# Patient Record
Sex: Male | Born: 1940
Health system: Southern US, Community
[De-identification: ages and names within clinical notes are randomized; demographics above are authoritative.]

## PROBLEM LIST (undated history)

## (undated) DIAGNOSIS — R071 Chest pain on breathing: Secondary | ICD-10-CM

## (undated) DIAGNOSIS — I10 Essential (primary) hypertension: Secondary | ICD-10-CM

## (undated) DIAGNOSIS — G459 Transient cerebral ischemic attack, unspecified: Secondary | ICD-10-CM

## (undated) DIAGNOSIS — R5381 Other malaise: Secondary | ICD-10-CM

## (undated) DIAGNOSIS — R079 Chest pain, unspecified: Secondary | ICD-10-CM

## (undated) DIAGNOSIS — R209 Unspecified disturbances of skin sensation: Secondary | ICD-10-CM

## (undated) DIAGNOSIS — N400 Enlarged prostate without lower urinary tract symptoms: Secondary | ICD-10-CM

## (undated) DIAGNOSIS — F419 Anxiety disorder, unspecified: Secondary | ICD-10-CM

## (undated) DIAGNOSIS — T4145XA Adverse effect of unspecified anesthetic, initial encounter: Secondary | ICD-10-CM

## (undated) DIAGNOSIS — G4733 Obstructive sleep apnea (adult) (pediatric): Secondary | ICD-10-CM

## (undated) DIAGNOSIS — D751 Secondary polycythemia: Secondary | ICD-10-CM

## (undated) DIAGNOSIS — F329 Major depressive disorder, single episode, unspecified: Secondary | ICD-10-CM

## (undated) DIAGNOSIS — C439 Malignant melanoma of skin, unspecified: Secondary | ICD-10-CM

## (undated) DIAGNOSIS — Z9889 Other specified postprocedural states: Secondary | ICD-10-CM

## (undated) DIAGNOSIS — K219 Gastro-esophageal reflux disease without esophagitis: Secondary | ICD-10-CM

## (undated) DIAGNOSIS — F32A Depression, unspecified: Secondary | ICD-10-CM

## (undated) DIAGNOSIS — H547 Unspecified visual loss: Secondary | ICD-10-CM

## (undated) DIAGNOSIS — R42 Dizziness and giddiness: Secondary | ICD-10-CM

## (undated) DIAGNOSIS — I495 Sick sinus syndrome: Secondary | ICD-10-CM

## (undated) DIAGNOSIS — R112 Nausea with vomiting, unspecified: Secondary | ICD-10-CM

## (undated) DIAGNOSIS — I480 Paroxysmal atrial fibrillation: Secondary | ICD-10-CM

## (undated) DIAGNOSIS — G629 Polyneuropathy, unspecified: Secondary | ICD-10-CM

## (undated) DIAGNOSIS — R5383 Other fatigue: Secondary | ICD-10-CM

## (undated) DIAGNOSIS — I499 Cardiac arrhythmia, unspecified: Secondary | ICD-10-CM

## (undated) DIAGNOSIS — Z950 Presence of cardiac pacemaker: Secondary | ICD-10-CM

## (undated) DIAGNOSIS — N529 Male erectile dysfunction, unspecified: Secondary | ICD-10-CM

## (undated) HISTORY — DX: Secondary polycythemia: D75.1

## (undated) HISTORY — PX: OTHER SURGICAL HISTORY: SHX169

## (undated) HISTORY — DX: Anxiety disorder, unspecified: F41.9

## (undated) HISTORY — PX: VASECTOMY: SHX75

## (undated) HISTORY — DX: Chest pain on breathing: R07.1

## (undated) HISTORY — DX: Depression, unspecified: F32.A

## (undated) HISTORY — PX: PACEMAKER INSERTION: SHX728

## (undated) HISTORY — DX: Obstructive sleep apnea (adult) (pediatric): G47.33

## (undated) HISTORY — DX: Polyneuropathy, unspecified: G62.9

## (undated) HISTORY — DX: Other fatigue: R53.83

## (undated) HISTORY — DX: Unspecified visual loss: H54.7

## (undated) HISTORY — DX: Benign prostatic hyperplasia without lower urinary tract symptoms: N40.0

## (undated) HISTORY — DX: Malignant melanoma of skin, unspecified: C43.9

## (undated) HISTORY — PX: INSERT / REPLACE / REMOVE PACEMAKER: SUR710

## (undated) HISTORY — DX: Transient cerebral ischemic attack, unspecified: G45.9

## (undated) HISTORY — DX: Other malaise: R53.81

## (undated) HISTORY — DX: Male erectile dysfunction, unspecified: N52.9

## (undated) HISTORY — DX: Paroxysmal atrial fibrillation: I48.0

## (undated) HISTORY — DX: Sick sinus syndrome: I49.5

## (undated) HISTORY — PX: LYMPH NODE BIOPSY: SHX201

## (undated) HISTORY — PX: CHOLECYSTECTOMY: SHX55

## (undated) HISTORY — DX: Major depressive disorder, single episode, unspecified: F32.9

## (undated) HISTORY — DX: Essential (primary) hypertension: I10

## (undated) HISTORY — DX: Chest pain, unspecified: R07.9

## (undated) HISTORY — DX: Dizziness and giddiness: R42

## (undated) HISTORY — DX: Unspecified disturbances of skin sensation: R20.9

---

## 1998-05-07 ENCOUNTER — Inpatient Hospital Stay (HOSPITAL_COMMUNITY): Admission: AD | Admit: 1998-05-07 | Discharge: 1998-05-08 | Payer: Self-pay | Admitting: Cardiology

## 2001-09-27 DIAGNOSIS — C439 Malignant melanoma of skin, unspecified: Secondary | ICD-10-CM

## 2001-09-27 HISTORY — DX: Malignant melanoma of skin, unspecified: C43.9

## 2002-04-16 ENCOUNTER — Encounter: Admission: RE | Admit: 2002-04-16 | Discharge: 2002-04-16 | Payer: Self-pay | Admitting: Internal Medicine

## 2002-04-16 ENCOUNTER — Encounter: Payer: Self-pay | Admitting: Internal Medicine

## 2003-05-21 ENCOUNTER — Encounter: Payer: Self-pay | Admitting: Cardiology

## 2003-09-28 DIAGNOSIS — G459 Transient cerebral ischemic attack, unspecified: Secondary | ICD-10-CM

## 2003-09-28 HISTORY — DX: Transient cerebral ischemic attack, unspecified: G45.9

## 2003-11-07 ENCOUNTER — Encounter: Payer: Self-pay | Admitting: Cardiology

## 2003-11-11 ENCOUNTER — Encounter: Payer: Self-pay | Admitting: Cardiology

## 2004-07-06 ENCOUNTER — Encounter: Payer: Self-pay | Admitting: Cardiology

## 2009-11-03 ENCOUNTER — Encounter: Payer: Self-pay | Admitting: Cardiology

## 2009-11-06 ENCOUNTER — Ambulatory Visit: Payer: Self-pay | Admitting: Cardiology

## 2009-11-06 ENCOUNTER — Encounter: Payer: Self-pay | Admitting: Cardiology

## 2010-05-27 ENCOUNTER — Encounter: Payer: Self-pay | Admitting: Cardiology

## 2010-05-28 ENCOUNTER — Encounter: Payer: Self-pay | Admitting: Cardiology

## 2010-08-05 ENCOUNTER — Encounter: Payer: Self-pay | Admitting: Cardiology

## 2010-08-11 ENCOUNTER — Encounter: Admission: RE | Admit: 2010-08-11 | Discharge: 2010-08-11 | Payer: Self-pay | Admitting: Diagnostic Neuroimaging

## 2010-09-08 ENCOUNTER — Encounter: Payer: Self-pay | Admitting: Cardiology

## 2010-09-16 DIAGNOSIS — G459 Transient cerebral ischemic attack, unspecified: Secondary | ICD-10-CM | POA: Insufficient documentation

## 2010-09-16 DIAGNOSIS — R071 Chest pain on breathing: Secondary | ICD-10-CM

## 2010-09-16 DIAGNOSIS — R5383 Other fatigue: Secondary | ICD-10-CM

## 2010-09-16 DIAGNOSIS — R5381 Other malaise: Secondary | ICD-10-CM | POA: Insufficient documentation

## 2010-09-16 DIAGNOSIS — R079 Chest pain, unspecified: Secondary | ICD-10-CM

## 2010-09-16 DIAGNOSIS — R209 Unspecified disturbances of skin sensation: Secondary | ICD-10-CM

## 2010-09-17 ENCOUNTER — Ambulatory Visit: Payer: Self-pay | Admitting: Cardiology

## 2010-09-17 ENCOUNTER — Encounter: Payer: Self-pay | Admitting: Cardiology

## 2010-09-17 DIAGNOSIS — R55 Syncope and collapse: Secondary | ICD-10-CM

## 2010-09-17 DIAGNOSIS — D751 Secondary polycythemia: Secondary | ICD-10-CM

## 2010-09-17 DIAGNOSIS — I4891 Unspecified atrial fibrillation: Secondary | ICD-10-CM | POA: Insufficient documentation

## 2010-09-17 DIAGNOSIS — I10 Essential (primary) hypertension: Secondary | ICD-10-CM | POA: Insufficient documentation

## 2010-09-23 ENCOUNTER — Encounter: Payer: Self-pay | Admitting: Cardiology

## 2010-09-30 ENCOUNTER — Emergency Department (HOSPITAL_COMMUNITY)
Admission: EM | Admit: 2010-09-30 | Discharge: 2010-10-01 | Payer: Self-pay | Source: Home / Self Care | Admitting: Emergency Medicine

## 2010-09-30 ENCOUNTER — Telehealth (INDEPENDENT_AMBULATORY_CARE_PROVIDER_SITE_OTHER): Payer: Self-pay | Admitting: *Deleted

## 2010-09-30 ENCOUNTER — Encounter: Payer: Self-pay | Admitting: Cardiology

## 2010-09-30 DIAGNOSIS — R55 Syncope and collapse: Secondary | ICD-10-CM

## 2010-10-02 ENCOUNTER — Encounter (INDEPENDENT_AMBULATORY_CARE_PROVIDER_SITE_OTHER): Payer: Self-pay | Admitting: *Deleted

## 2010-10-02 ENCOUNTER — Ambulatory Visit
Admission: RE | Admit: 2010-10-02 | Discharge: 2010-10-02 | Payer: Self-pay | Source: Home / Self Care | Attending: Internal Medicine | Admitting: Internal Medicine

## 2010-10-02 ENCOUNTER — Ambulatory Visit: Admit: 2010-10-02 | Payer: Self-pay | Admitting: Internal Medicine

## 2010-10-02 ENCOUNTER — Encounter: Payer: Self-pay | Admitting: Internal Medicine

## 2010-10-02 ENCOUNTER — Encounter: Payer: Self-pay | Admitting: Cardiology

## 2010-10-02 DIAGNOSIS — R42 Dizziness and giddiness: Secondary | ICD-10-CM | POA: Insufficient documentation

## 2010-10-02 DIAGNOSIS — I495 Sick sinus syndrome: Secondary | ICD-10-CM | POA: Insufficient documentation

## 2010-10-03 ENCOUNTER — Encounter: Payer: Self-pay | Admitting: Cardiology

## 2010-10-04 ENCOUNTER — Telehealth (INDEPENDENT_AMBULATORY_CARE_PROVIDER_SITE_OTHER): Payer: Self-pay | Admitting: *Deleted

## 2010-10-05 ENCOUNTER — Ambulatory Visit (HOSPITAL_COMMUNITY)
Admission: RE | Admit: 2010-10-05 | Discharge: 2010-10-06 | Payer: Self-pay | Source: Home / Self Care | Attending: Internal Medicine | Admitting: Internal Medicine

## 2010-10-05 DIAGNOSIS — I495 Sick sinus syndrome: Secondary | ICD-10-CM

## 2010-10-05 LAB — CONVERTED CEMR LAB
BUN: 16 mg/dL (ref 6–23)
Basophils Absolute: 0 10*3/uL (ref 0.0–0.1)
Basophils Relative: 0 % (ref 0–1)
CO2: 31 meq/L (ref 19–32)
Calcium: 9.4 mg/dL (ref 8.4–10.5)
Chloride: 103 meq/L (ref 96–112)
Creatinine, Ser: 1.38 mg/dL (ref 0.40–1.50)
Eosinophils Absolute: 0.1 10*3/uL (ref 0.0–0.7)
Eosinophils Relative: 1 % (ref 0–5)
Glucose, Bld: 91 mg/dL (ref 70–99)
HCT: 49.2 % (ref 39.0–52.0)
Hemoglobin: 17.3 g/dL — ABNORMAL HIGH (ref 13.0–17.0)
INR: 1.02 (ref <1.50)
Lymphocytes Relative: 19 % (ref 12–46)
Lymphs Abs: 2.4 10*3/uL (ref 0.7–4.0)
MCHC: 35.2 g/dL (ref 30.0–36.0)
MCV: 88.2 fL (ref 78.0–100.0)
Monocytes Absolute: 0.9 10*3/uL (ref 0.1–1.0)
Monocytes Relative: 7 % (ref 3–12)
Neutro Abs: 8.9 10*3/uL — ABNORMAL HIGH (ref 1.7–7.7)
Neutrophils Relative %: 72 % (ref 43–77)
Platelets: 262 10*3/uL (ref 150–400)
Potassium: 4.4 meq/L (ref 3.5–5.3)
Prothrombin Time: 13.6 s (ref 11.6–15.2)
RBC: 5.58 M/uL (ref 4.22–5.81)
RDW: 14.6 % (ref 11.5–15.5)
Sodium: 142 meq/L (ref 135–145)
WBC: 12.3 10*3/uL — ABNORMAL HIGH (ref 4.0–10.5)
aPTT: 28 s (ref 24–37)

## 2010-10-06 ENCOUNTER — Encounter: Payer: Self-pay | Admitting: Internal Medicine

## 2010-10-08 ENCOUNTER — Telehealth: Payer: Self-pay | Admitting: Internal Medicine

## 2010-10-12 ENCOUNTER — Telehealth (INDEPENDENT_AMBULATORY_CARE_PROVIDER_SITE_OTHER): Payer: Self-pay | Admitting: *Deleted

## 2010-10-12 LAB — SURGICAL PCR SCREEN
MRSA, PCR: NEGATIVE
Staphylococcus aureus: NEGATIVE

## 2010-10-14 ENCOUNTER — Encounter: Payer: Self-pay | Admitting: Cardiology

## 2010-10-19 ENCOUNTER — Encounter: Payer: Self-pay | Admitting: Internal Medicine

## 2010-10-19 ENCOUNTER — Ambulatory Visit: Admission: RE | Admit: 2010-10-19 | Discharge: 2010-10-19 | Payer: Self-pay | Source: Home / Self Care

## 2010-10-20 ENCOUNTER — Ambulatory Visit
Admission: RE | Admit: 2010-10-20 | Discharge: 2010-10-20 | Payer: Self-pay | Source: Home / Self Care | Attending: Cardiology | Admitting: Cardiology

## 2010-10-20 LAB — CONVERTED CEMR LAB: POC INR: 1.3

## 2010-10-27 ENCOUNTER — Ambulatory Visit: Admission: RE | Admit: 2010-10-27 | Discharge: 2010-10-27 | Payer: Self-pay | Source: Home / Self Care

## 2010-10-27 LAB — CONVERTED CEMR LAB: POC INR: 1.5

## 2010-10-27 NOTE — Letter (Signed)
Summary: External Correspondence/ [facility]  External Correspondence/ [facility]   Imported By: Dorise Hiss 08/06/2010 08:58:17  _____________________________________________________________________  External Attachment:    Type:   Image     Comment:   External Document

## 2010-10-28 ENCOUNTER — Telehealth (INDEPENDENT_AMBULATORY_CARE_PROVIDER_SITE_OTHER): Payer: Self-pay | Admitting: *Deleted

## 2010-10-28 ENCOUNTER — Encounter: Payer: Self-pay | Admitting: Cardiology

## 2010-10-28 ENCOUNTER — Encounter: Payer: Self-pay | Admitting: *Deleted

## 2010-10-28 ENCOUNTER — Ambulatory Visit (INDEPENDENT_AMBULATORY_CARE_PROVIDER_SITE_OTHER): Payer: Medicare Other | Admitting: Cardiology

## 2010-10-28 ENCOUNTER — Ambulatory Visit: Admit: 2010-10-28 | Payer: Self-pay | Admitting: Cardiology

## 2010-10-28 DIAGNOSIS — I4891 Unspecified atrial fibrillation: Secondary | ICD-10-CM

## 2010-10-28 DIAGNOSIS — Z950 Presence of cardiac pacemaker: Secondary | ICD-10-CM

## 2010-10-29 NOTE — Medication Information (Signed)
Summary: new coumadin  --agh  Anticoagulant Therapy  Managed by: Vashti Hey, RN Referring MD: Andee Lineman PCP: none Supervising MD: Diona Browner MD, Remi Deter Indication 1: Atrial Fibrillation Lab Used: [facility] [place] Site: [place] INR POC 1.3  Dietary changes: yes       Details: states he has had a lot of Vit K foods this wk   Discussed consistancy in diet  Health status changes: no    Bleeding/hemorrhagic complications: no    Recent/future hospitalizations: yes       Details: Had pacemaker implant  H/O Atrial fib  Any changes in medication regimen? yes       Details: Started on coumadin 5mg  qd on 10/07/10   Has 5mg  tablet  Recent/future dental: no  Any missed doses?: no       Is patient compliant with meds? yes       Allergies: 1)  ! Sulfonamides 2)  ! * Ivp Dye  Anticoagulation Management History:      The patient comes in today for his initial visit for anticoagulation therapy.  Positive risk factors for bleeding include an age of 102 years or older and history of CVA/TIA.  The bleeding index is 'intermediate risk'.  Positive CHADS2 values include History of HTN and Prior Stroke/CVA/TIA.  Negative CHADS2 values include Age > 59 years old.  His last INR was 1.02.  Anticoagulation responsible provider: Diona Browner MD, Remi Deter.  INR POC: 1.3.  Cuvette Lot#: [id number].    Anticoagulation Management Assessment/Plan:      The patient's current anticoagulation dose is Coumadin 5 mg tabs: take as directed per coumadin clinic.  The target INR is 2.0-3.0.  The next INR is due 10/27/2010.  Anticoagulation instructions were given to patient.  Results were reviewed/authorized by Vashti Hey, RN.  He was notified by Vashti Hey RN.         Current Anticoagulation Instructions: INR 1.3 Increase coumadin to 7.5mg  once daily

## 2010-10-29 NOTE — Progress Notes (Addendum)
Summary: decline coumadin at this time   ---- Converted from flag ---- ---- 10/08/2010 1:18 PM, Joshua Bunting, MD, [facility] wrote: yes we should start patient on Coumadinhe can be followed in the Coumadin in the interim. if the patient prefers Rivaroxaban we can switch him to do at the time of his clinic presentation.  [name], I assume you want this patient on Coumadin given his history of neurological symptoms. technically his CHADs2 score is low but I am concerned about his previous neurological symptoms.  ---- 10/08/2010 12:26 PM, [name], LPN wrote: Does not look like coumadin was started per his D/C note.  Has pacer check scheduled for 1/23 at [facility].  Has OV with you scheduled for 2/1.  Want to go ahead and start him or can this wait till OV on 2/1? ------------------------------  Phone Note Outgoing Call   Summary of Call: Discussed above with patient.  States that he prefers to wait until see Dr. [name] on 1/23 so he can discuss in more detail.  States his wife is "anti-coumadin" and really needs to discuss more with wife and MD.  Has pending OV wtih Dr. Andee Lineman for 2/1.    Initial call taken by: [name], LPN,  October 12, 2010 5:03 PM  Follow-up for Phone Call        Noted Follow-up by: Joshua Bunting, MD, [facility],  October 12, 2010 5:57 PM  Additional Follow-up for Phone Call Additional follow up Details #1::        Patient walked into office.  Stated that he talked with his pharmacist about the coumadin and has decided that he would like to start med.  New rx sent to [facility].  First INR check scheduled for Tuesday, 1/24 at 10:00 with Misty [facility] coumadin clinic.   Additional Follow-up by: [name], LPN,  October 14, 2010 10:46 AM    Additional Follow-up for Phone Call Additional follow up Details #2::    I hope this is his final decision. Follow-up by: Joshua Bunting, MD, [facility],  October 14, 2010 2:00 PM  New/Updated Medications: COUMADIN 5 MG TABS (WARFARIN SODIUM) take as  directed per coumadin clinic Prescriptions: COUMADIN 5 MG TABS (WARFARIN SODIUM) take as directed per coumadin clinic  #30 x 2   Entered by:   [name], LPN   Authorized by:   Joshua Bunting, MD, [facility]   Signed by:   [name], LPN on 91/47/8295   Method used:   Electronically to        [facility]* (retail)       [address]       [place], [place]  [address]       Ph: [phone]       Fax: [phone]   RxID:   [id number]

## 2010-10-29 NOTE — Procedures (Signed)
Summary: [facility]   Current Medications (verified): 1)  Acetyl L-Carnitine 250 Mg Caps (Acetylcarnitine Hcl) .... Take 1 Tablet By Mouth Once A Day 2)  Resveratrol 100 Mg Caps (Resveratrol) .... Take 2 Tablet By Mouth Once A Day 3)  Testosterone Pellats .... Injected Into Buttocks 4)  Multivitamins  Tabs (Multiple Vitamin) .... Take 1 Tablet By Mouth Once A Day 5)  Citracal Plus  Tabs (Multiple Minerals-Vitamins) .... 1200mg  Take 1 Tablet By Mouth Once A Day 6)  Magnesium 250 Mg Tabs (Magnesium) .... Take 1 Tablet By Mouth Once A Day 7)  Zinc 50 Mg Tabs (Zinc) .... Take 1 Tablet By Mouth Once A Day 8)  Fish Oil Double Strength 1200 Mg Caps (Omega-3 Fatty Acids) .... Take 1 Tablet By Mouth Once A Day 9)  Vitamin B-12 1000 Mcg Tabs (Cyanocobalamin) .... Take 1 Tablet By Mouth Once A Day 10)  Vitamin D3 2000 Unit Caps (Cholecalciferol) .... Take 1 Tablet By Mouth Once A Day 11)  Probiotic  Caps (Probiotic Product) .... Take 1 Tablet By Mouth Once A Day 12)  Metoprolol Tartrate 25 Mg Tabs (Metoprolol Tartrate) .... 1/2 By Mouth Two Times A Day 13)  Coumadin 5 Mg Tabs (Warfarin Sodium) .... Take As Directed Per Coumadin Clinic  Allergies (verified): 1)  ! Sulfonamides 2)  ! * Ivp Dye  PPM Specifications Following MD:  Lewayne Bunting, MD     PPM Vendor:  [employer] Jude     PPM Model Number:  [id number]     PPM Serial Number:  [id number] PPM DOI:  10/05/2010     PPM Implanting MD:  Lewayne Bunting, MD  Lead 1    Location: RA     DOI: 10/05/2010     Model #: [id number]     Serial #: [id number]     Status: active Lead 2    Location: RV     DOI: 10/05/2010     Model #: [id number]     Serial #: [id number]     Status: active  Magnet Response Rate:  BOL 100 ERI 85  Indications:  Sick sinus syndrome   PPM Follow Up Battery Voltage:  3.14 V     Battery Est. Longevity:  5.9-8.5 yrs       PPM Device Measurements Atrium  Amplitude: 5.0 mV, Impedance: 450 ohms, Threshold: 1.0 V at 0.4 msec Right Ventricle  Amplitude: 12.0  mV, Impedance: 450 ohms, Threshold: 0.75 V at 0.5 msec  Episodes MS Episodes:  44     Percent Mode Switch:  2.7%     Ventricular High Rate:  6     Atrial Pacing:  58%     Ventricular Pacing:  <1%  Parameters Mode:  DDD     Lower Rate Limit:  60     Upper Rate Limit:  110 Paced AV Delay:  250     Sensed AV Delay:  250 Next Cardiology Appt Due:  12/28/2010 Tech Comments:  WOUND CHECK---STERI STRIPS REMOVED.  NO REDNESS OR SWELLING AT SITE.  NORMAL DEVICE FUNCTION.  44 AMS EPISODES--LONGEST WAS 3 MIN 54 SECONDS.  + COUMADIN.  6 VHR EPISODES-- AF W/RVR.  TURNED RATE RESPONSE ON DURING MODE SWITCH.  ROV IN 3 MTHS W/GT.  PT HAS [facility] TRANSMITTER HOOKED UP. Vella Kohler  October 19, 2010 10:04 AM

## 2010-10-29 NOTE — Progress Notes (Signed)
Summary: Cardiology Phone Note - PPM  Phone Note Call from Patient Call back at Home Phone [phone]   Caller: wife - Drinda Butts Reason for Call: Refill Medication Summary of Call: Returned call from pt's wife concerning preprocedure prednisone for PPM insertion on 10/05/10.  I have called in Prednisone 20mg  three tablets @ 9:30pm the night prior to your procedure & Prednisone 20mg  three tablets on arrival to the hospital (per pre procedure checklist) to [facility] at [phone].  The wife was appreciative of the call back.  I also recieved a notification from [facility] of a 5 sec pause.  In reviewing the pt's record it appears he has between 5-7 sec pauses and will have PPM insertion tomorrow.  Pt was lightheaded with this episode but no franak syncope, SOB or CP.  Will follow.  Pt has been instructed to call 911 if symptoms get worse or are prolonged.     Initial call taken by: Robbi Garter NP-PA,  October 04, 2010 5:06 PM

## 2010-10-29 NOTE — Letter (Signed)
Summary: Implantable Device Instructions  Architectural technologist, Main Office  [address]   [place], [place] [address]   Phone: [phone]  Fax: [phone]      Implantable Device Instructions  You are scheduled for:  __x___ Permanent Transvenous Pacemaker _____ Implantable Cardioverter Defibrillator _____ Implantable Loop Recorder _____ Generator Change  on Monday 10/05/10 with Dr. [name].  1.  Please arrive at the Short Stay Center at [facility] at 1:30 pm on the day of your procedure.  2.  Do not eat or drink the night before your procedure.  3.  Complete lab work on (10/02/10).  The lab at [facility] is open from 8:30 AM to 1:30 PM and from 2:30 PM to 5:00 PM.  The lab at [facility] is open from 7:30 AM to 5:30 PM.  You do not have to be fasting.  4.  Do NOT take these medications for the morning of  your procedure:  Aspirin.    5.  Plan for an overnight stay.  Bring your insurance cards and a list of your medications.  6.  Wash your chest and neck with antibacterial soap (any brand) the evening before and the morning of your procedure.  Rinse well.  7.  Education material received:     Pacemaker __x___           ICD _____           Arrhythmia _____  8.  You will need to start: 1) Prednisone 20mg  three tablets @ 9:30pm the night prior to your procedure & Prednisone 20mg  three tablets on arrival to the hospital, 2) Benadryl 25mg  one tablet @ 9:30pm the night prior to your procedure.   *If you have ANY questions after you get home, please call the office [phone].  *Every attempt is made to prevent procedures from being rescheduled.  Due to the nauture of Electrophysiology, rescheduling can happen.  The physician is always aware and directs the staff when this occurs.

## 2010-10-29 NOTE — Procedures (Signed)
Summary: Holter and Event/ [facility]  Holter and Event/ [facility]   Imported By: Dorise Hiss 10/06/2010 16:59:14  _____________________________________________________________________  External Attachment:    Type:   Image     Comment:   External Document

## 2010-10-29 NOTE — Progress Notes (Signed)
Summary: c/o heat goes up his body, lightheadness  Phone Note Call from Patient Call back at Home Phone [phone]   Caller: Patient Reason for Call: Talk to Nurse Summary of Call: c/o same symptoms before he had his pacemaker implant , heat goes  up his body, lightheadness,  Initial call taken by: Lorne Skeens,  October 08, 2010 8:41 AM  Follow-up for Phone Call        start Metoprolol 12.5mg  two times a day and arrange follow up for post hospital Dennis Bast, RN, BSN  October 08, 2010 10:11 AM spoke with pt this is his afib he is feeling  Called pt and let him know his apt time and that his med was called in Dennis Bast, RN, BSN  October 08, 2010 11:37 AM    New/Updated Medications: METOPROLOL TARTRATE 25 MG TABS (METOPROLOL TARTRATE) 1/2 by mouth two times a day Prescriptions: METOPROLOL TARTRATE 25 MG TABS (METOPROLOL TARTRATE) 1/2 by mouth two times a day  #60 x 6   Entered by:   Dennis Bast, RN, BSN   Authorized by:   Laren Boom, MD, [facility]   Signed by:   Dennis Bast, RN, BSN on 10/08/2010   Method used:   Electronically to        [facility]* (retail)       [address]       [place], [place]  [address]       Ph: [phone]       Fax: [phone]   RxID:   [id number]

## 2010-10-29 NOTE — Progress Notes (Signed)
Summary: Cardiology Phone Note - Pause  Phone Note Call from Patient   Caller: Patient/[facility] Summary of Call: Received call from [facility] tonight re: significant pause on heart monitor. He has only been on the monitory monitor for several hours, but sustained a 6.7second pause. Called pt at home, he was resting/asleep and does not appear to have had any symptoms. Chart reviewed re: numbness/weakness/concern for TIA. Advised pt to proceed to hospital for further eval & monitoring in the event that this recurs given significant length of time of pause. He expressed understanding and knows not to drive himself.  Initial call taken by: Ronie Spies PA-C     Appended Document: Cardiology Phone Note - Pause Needs to be added to EP schedule in [place] ASAP- either Thursday /Friday or no later then Monday. Scan rhtym strips in EMR and forward to me ASAP.   Appended Document: Cardiology Phone Note - Pause Patient notified.   Appointment scheduled for:    Friday, January 6 at 3:00 with Dr. [name] at [facility] office in [place].    Appended Document: Cardiology Phone Note - Pause noted

## 2010-10-29 NOTE — Assessment & Plan Note (Signed)
Summary: np-ref: Dr. Joycelyn Schmid (neurology)   Visit Type:  Initial Consult Primary Provider:  none   History of Present Illness: the patient is an elderly male with a complicated history that involves facial numbness, right arm and right leg numbness which has been intermittent. there also has been intermittent visual loss over the last 6 months. He reports that at one point in October 2005 he lost use of his left arm for several months. MRI of the brain and cervical spine were unremarkable. There was no definite explanation for his left arm weakness. 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     The patient has now been referred because he continues to have symptoms of numbness in the lateral aspect of his right leg as well has the right arm. There is no associated motor weakness. He was recently referred for a Cardiolite stress study at the end of exercise the patient had a brief episode of atrial fibrillation in recovery phase. The patient was entirely asymptomatic. However per neurologist it was felt that there was concern that the patient could have thromboembolic disease with TIAs. However his neurological symptoms appear not to be consistent with TIAs and the patient reports no palpitations. He was also asymptomatic with this episode of atrial fibrillation. He also describes numbness in upper and lower lips which appears to be worse in the morning and remains persistent. He states that he has intermittent lightheadedness mainly when sitting down working at his computer. He reports one day where he was sitting at the computer had complete loss of vision lasting for several seconds. The supple several occasions didn't appear to box of September 2011. There was however no loss of consciousness. The symptoms are usually preceded by a sensation of warmth and a feeling of ascending weakness.  Of note is that the patient on his recent blood work had a  hemoglobin of 17.5 consistent with elevated hemoglobin. The etiology for this is not clear. Interestingly the patient's neurological symptoms may well be related to hyperviscosity syndrome. He needs a further evaluation of his elevated hemoglobin particularly as the patient is not a smoker and denies pending at the time on high altitude. The differential diagnosis includes primary erythrocytosis or even polycythemia vera based on the fact that he also has an elevated Polendo count. The patient will need erythropoietin level to document this it is a primary erythrocytosis or secondary phenomena. Additionally it erythropoietin level is low the patient probably should be tested for a JAK2 mutation. I also told the patient that renal cell carcinoma can present in this fashion. His neurological symptoms are very much consistent with hyperviscosity.     I also told the patient is highly doubtful that his brief episodes of age of fibrillation has anything to do with the findings on his MRI. Reportedly the patient has findings that could be consistent with multiple sclerosis, vasculitis or some other inflammatory process. Reportedly no lumbar puncture has been performed so far.     We will proceed with a cardiac monitor and echocardiogram to rule out structural heart disease and make sure that patient does not have symptomatic it for ablation or for that matter any evidence of atrial fibrillation. If this is negative the patient will need a referral back to neurology for evaluation of possible multiple sclerosis and also to hematology for evaluation of his erythrocytosis.    Preventive Screening-Counseling & Management  Alcohol-Tobacco     Smoking Status: quit  Year Quit: 1970  Current Medications (verified): 1)  Aspir-Low 81 Mg Tbec (Aspirin) .... Take 1 Tablet By Mouth Once A Day 2)  Acetyl L-Carnitine 250 Mg Caps (Acetylcarnitine Hcl) .... Take 1 Tablet By Mouth Once A Day 3)  Resveratrol 100 Mg  Caps (Resveratrol) .... Take 2 Tablet By Mouth Once A Day 4)  Testosterone Pellats .... Injected Into Buttocks 5)  Multivitamins  Tabs (Multiple Vitamin) .... Take 1 Tablet By Mouth Once A Day 6)  Citracal Plus  Tabs (Multiple Minerals-Vitamins) .... 1200mg  Take 1 Tablet By Mouth Once A Day 7)  Magnesium 250 Mg Tabs (Magnesium) .... Take 1 Tablet By Mouth Once A Day 8)  Zinc 50 Mg Tabs (Zinc) .... Take 1 Tablet By Mouth Once A Day 9)  Fish Oil Double Strength 1200 Mg Caps (Omega-3 Fatty Acids) .... Take 1 Tablet By Mouth Once A Day 10)  Vitamin B-12 1000 Mcg Tabs (Cyanocobalamin) .... Take 1 Tablet By Mouth Once A Day 11)  Vitamin D3 2000 Unit Caps (Cholecalciferol) .... Take 1 Tablet By Mouth Once A Day 12)  Probiotic  Caps (Probiotic Product) .... Take 1 Tablet By Mouth Once A Day  Allergies: 1)  ! Sulfonamides 2)  ! * Ivp Dye  Comments:  Nurse/Medical Assistant: The patient's medication list and allergies were reviewed with the patient and were updated in the Medication and Allergy Lists.  Past History:  Family History: Last updated: Sep 26, 2010 Father died with prostate cAncer  Social History: Last updated: September 26, 2010 Married  Tobacco Use - No.  Alcohol Use - no Drug Use - no  Risk Factors: Smoking Status: quit (09/17/2010)  Past Medical History: Hx of Melanoma depression anxiety Paroxysmal atrial Fibrillation at the time off exercise stress testing six month hs of perioral numbness two month hx of rt. leg numbness episodes of Rt. arm numbness  intermittent visual loss Cardiolite study February 2011 no ischemia ejection fraction 63% but the patient developed atrial fibrillation during the study abnormal MRI of the head September 2011 no acute intracranial abnormality or focal lesion to explain the patient's symptoms, scattered periventricular and subcortical T2 hyperintensities are greater than expected for age finding is nonspecific but can be seen with chronic  microvascular ischemia demyelinating process such as multiple sclerosis, vasculitis or complicated migraine headaches.  Follow-up per neurology  Social History: Smoking Status:  quit  Review of Systems       The patient complains of dizziness.  The patient denies fatigue, malaise, fever, weight gain/loss, vision loss, decreased hearing, hoarseness, chest pain, palpitations, shortness of breath, prolonged cough, wheezing, sleep apnea, coughing up blood, abdominal pain, blood in stool, nausea, vomiting, diarrhea, heartburn, incontinence, blood in urine, muscle weakness, joint pain, leg swelling, rash, skin lesions, headache, fainting, depression, anxiety, enlarged lymph nodes, easy bruising or bleeding, and environmental allergies.    Vital Signs:  Patient profile:   70 year old male Height:      70 inches Weight:      183 pounds BMI:     26.35 Pulse rate:   60 / minute Pulse (ortho):   64 / minute BP sitting:   135 / 84  (left arm) BP standing:   162 / 95 Cuff size:   regular  Vitals Entered By: Carlye Grippe (September 17, 2010 10:49 AM)  Nutrition Counseling: Patient's BMI is greater than 25 and therefore counseled on weight management options.  Serial Vital Signs/Assessments:  Time      Position  BP  Pulse  Resp  Temp     By 12:09 PM  Lying RA  150/84   58                    Gayle [address] LPN 29:56 PM  Sitting   161/80   66                    Gayle [address] LPN 21:30 PM  Standing  162/95   64                    Tucker, LPN  Comments: 86:57 PM after 3 min:  165/88  65 after 5 min:  151/88  64 By: [name], LPN    Physical Exam  Additional Exam:  General: Well-developed, well-nourished in no distress head: Normocephalic and atraumatic eyes PERRLA/EOMI intact, conjunctiva and lids normal nose: No deformity or lesions mouth normal dentition, normal posterior pharynx neck: Supple, no JVD.  No masses, thyromegaly or abnormal cervical nodes lungs: Normal breath sounds  bilaterally without wheezing.  Normal percussion heart: regular rate and rhythm with normal S1 and S2, no S3 or S4.  PMI is normal.  No pathological murmurs abdomen: Normal bowel sounds, abdomen is soft and nontender without masses, organomegaly or hernias noted.  No hepatosplenomegaly musculoskeletal: Back normal, normal gait muscle strength and tone normal pulsus: Pulse is normal in all 4 extremities Extremities: No peripheral pitting edema neurologic: Alert and oriented x 3 skin: Intact without lesions or rashes cervical nodes: No significant adenopathy psychologic: Normal affect    Impression & Recommendations:  Problem # 1:  ATRIAL FIBRILLATION (ICD-427.31) patient developed atrial fibrillation during stress testing.  Is not entirely clear however that this correlates with his symptoms.  The neurologist was concerned about possible thromboembolic disease.  However symptoms are not consistent with TIAs and the patient reports no palpitations.  However because of this concern we will proceed with a cardiac monitor to make sure that the patient does not have paroxysmal atrial fibrillation and requires anticoagulation.  Aspirin can be continued for right now.  Based on MRI I also ordered some nonspecific test for her chronic inflammation. His updated medication list for this problem includes:    Aspir-low 81 Mg Tbec (Aspirin) .Marland Kitchen... Take 1 tablet by mouth once a day  Orders: T-CBC w/Diff ([id number]) T-Sed Rate (Automated) ([id number]) CRP, high sensitivity-[facility] ([id number]) T- * Misc. Laboratory test [id number]) 2-D Echocardiogram (2D Echo) Card[facility]/Event Monitor ([facility]/Event)  Problem # 2:  ERYTHROCYTOSIS (ICD-289.0) Assessment: Improved the patient has significant erythrocytosis.  We will obtain an erythropoietin level to make sure that this is a secondary erythrocytosis and not a primary process like essential erythrocytosis or polycythemiavera for that matter. Orders: T-CBC  w/Diff ([id number]) T-Sed Rate (Automated) ([id number]) CRP, high sensitivity-[facility] [phone]) T- * Misc. Laboratory test [phone])  Problem # 3:  TIA (ICD-435.9) will obtain an echocardiogram to make sure the patient does not have thrombus or valvular heart disease.  Problem # 4:  ESSENTIAL HYPERTENSION, BENIGN (ICD-401.1) the patient appears to have hypertension.  This will need follow-up.  He is not orthostatic however.  We will address this after diagnostic studies have been obtained.  The patient could have an element of [place] coat hypertension today. Orders: T-CBC w/Diff ([id number]) T-Sed Rate (Automated) ([id number]) CRP, high sensitivity-[facility] [id number]) T- * Misc. Laboratory test [id number])  His updated medication list for this problem includes:    Aspir-low 81 Mg Tbec (Aspirin) .Marland Kitchen... Take 1  tablet by mouth once a day  Patient Instructions: 1)  Echo  2)  Cardionet  3)  Labs:  cbc w/ diff, sed rate, hscrp, epo-level 4)  Follow up in  1 month

## 2010-10-29 NOTE — Assessment & Plan Note (Signed)
Summary: nep   Visit Type:  Follow-up Primary Provider:  none  CC:  dizziness and 6-7 sec pauses on monitor.  History of Present Illness: [name] is referred today for evaluation of symptomatic bradycardia associated with post-termination of atrial fib and flutter.  The patient has for the past several weeks had recurrent episodes when he felt as if he were about to pass out.  These episodes are not related to exertion or activity and can come on at any time. He has never had frank syncope. He states that at times her feels like he is about to pass out. These episode occur suddenly without warning. He has been described to have both atrial fib and flutter. He has not  been on any medications, specifically no AV nodal blocking drugs.  Current Medications (verified): 1)  Aspir-Low 81 Mg Tbec (Aspirin) .... Take 1 Tablet By Mouth Once A Day 2)  Acetyl L-Carnitine 250 Mg Caps (Acetylcarnitine Hcl) .... Take 1 Tablet By Mouth Once A Day 3)  Resveratrol 100 Mg Caps (Resveratrol) .... Take 2 Tablet By Mouth Once A Day 4)  Testosterone Pellats .... Injected Into Buttocks 5)  Multivitamins  Tabs (Multiple Vitamin) .... Take 1 Tablet By Mouth Once A Day 6)  Citracal Plus  Tabs (Multiple Minerals-Vitamins) .... 1200mg  Take 1 Tablet By Mouth Once A Day 7)  Magnesium 250 Mg Tabs (Magnesium) .... Take 1 Tablet By Mouth Once A Day 8)  Zinc 50 Mg Tabs (Zinc) .... Take 1 Tablet By Mouth Once A Day 9)  Fish Oil Double Strength 1200 Mg Caps (Omega-3 Fatty Acids) .... Take 1 Tablet By Mouth Once A Day 10)  Vitamin B-12 1000 Mcg Tabs (Cyanocobalamin) .... Take 1 Tablet By Mouth Once A Day 11)  Vitamin D3 2000 Unit Caps (Cholecalciferol) .... Take 1 Tablet By Mouth Once A Day 12)  Probiotic  Caps (Probiotic Product) .... Take 1 Tablet By Mouth Once A Day  Allergies (verified): 1)  ! Sulfonamides 2)  ! * Ivp Dye  Past History:  Past Medical History: Last updated: 09/17/2010 Hx of  Melanoma depression anxiety Paroxysmal atrial Fibrillation at the time off exercise stress testing six month hs of perioral numbness two month hx of rt. leg numbness episodes of Rt. arm numbness  intermittent visual loss Cardiolite study February 2011 no ischemia ejection fraction 63% but the patient developed atrial fibrillation during the study abnormal MRI of the head September 2011 no acute intracranial abnormality or focal lesion to explain the patient's symptoms, scattered periventricular and subcortical T2 hyperintensities are greater than expected for age finding is nonspecific but can be seen with chronic microvascular ischemia demyelinating process such as multiple sclerosis, vasculitis or complicated migraine headaches.  Follow-up per neurology  Family History: Last updated: 08-Oct-2010 Father died with prostate cAncer  Social History: Last updated: 10/08/2010 Married  Tobacco Use - No.  Alcohol Use - no Drug Use - no  Risk Factors: Smoking Status: quit (09/17/2010)  Review of Systems       All systems reviewed and negative except as noted in the HPI.  Vital Signs:  Patient profile:   70 year old male Height:      70 inches Weight:      181 pounds BMI:     26.06 Pulse rate:   70 / minute BP sitting:   159 / 114  (left arm) Cuff size:   regular  Vitals Entered By: Caralee Ates CMA (October 02, 2010 3:24 PM)  Physical Exam  General:  Well developed, well nourished, in no acute distress.  HEENT: normal Neck: supple. No JVD. Carotids 2+ bilaterally no bruits Cor: RRR no rubs, gallops or murmur Lungs: CTA Ab: soft, nontender. nondistended. No HSM. Good bowel sounds Ext: warm. no cyanosis, clubbing or edema Neuro: alert and oriented. Grossly nonfocal. affect pleasant    Impression & Recommendations:  Problem # 1:  SICK SINUS SYNDROME (ICD-427.81) The patient is symptomatic with post termination pauses out of atrial fib. I have discussed the treatment  options with the patient and have recommended PPM.  He may well require ant-arrhythmic drugs as well. His updated medication list for this problem includes:    Aspir-low 81 Mg Tbec (Aspirin) .Marland Kitchen... Take 1 tablet by mouth once a day  Orders: T-Basic Metabolic Panel [phone]) T-CBC w/Diff [phone]) T-PTT ([id number]) T-Protime, Auto ([id number]) Pacer (Pacer)  Problem # 2:  ESSENTIAL HYPERTENSION, BENIGN (ICD-401.1) His blood pressure is well controlled. Continue meds and a low sodium diet. His updated medication list for this problem includes:    Aspir-low 81 Mg Tbec (Aspirin) .Marland Kitchen... Take 1 tablet by mouth once a day  Problem # 3:  ATRIAL FIBRILLATION (ICD-427.31) I have recommended her continue his current meds. His updated medication list for this problem includes:    Aspir-low 81 Mg Tbec (Aspirin) .Marland Kitchen... Take 1 tablet by mouth once a day  Orders: T-Basic Metabolic Panel [id number]) T-CBC w/Diff [phone]) T-PTT [phone]) T-Protime, Auto ([id number]) Pacer (Pacer)  Patient Instructions: 1)  Your physician has recommended that you have a pacemaker inserted.  A pacemaker is a small device that is placed under the skin of your chest or abdomen to help control abnormal heart rhythms. This device uses electrical pulses to prompt the heart to beat at a normal rate. Pacemakers are used to treat heart rhythms that are too slow. Wires (leads) are attached to the pacemaker that goes into the chambers of your heart. This is done in the hospital and usually requires an overnight stay. Please see the instruction sheet given to you today for more information. 2)  Labwork today: bmet/cbc/pt/ptt (427.31;427.81;780.4).

## 2010-10-29 NOTE — Miscellaneous (Signed)
Summary: Device preload  Clinical Lists Changes  Observations: Added new observation of PPM INDICATN: Sick sinus syndrome (10/06/2010 13:38) Added new observation of MAGNET RTE: BOL 100 ERI 85 (10/06/2010 13:38) Added new observation of PPMLEADSTAT2: active (10/06/2010 13:38) Added new observation of PPMLEADSER2: [id number] (10/06/2010 13:38) Added new observation of PPMLEADMOD2: [id number] (10/06/2010 13:38) Added new observation of PPMLEADLOC2: RV (10/06/2010 13:38) Added new observation of PPMLEADSTAT1: active (10/06/2010 13:38) Added new observation of PPMLEADSER1: [id number] (10/06/2010 13:38) Added new observation of PPMLEADMOD1: 6295MW (10/06/2010 13:38) Added new observation of PPMLEADLOC1: RA (10/06/2010 13:38) Added new observation of PPM IMP MD: Lewayne Bunting, MD (10/06/2010 13:38) Added new observation of PPMLEADDOI2: 10/05/2010 (10/06/2010 13:38) Added new observation of PPMLEADDOI1: 10/05/2010 (10/06/2010 13:38) Added new observation of PPM DOI: 10/05/2010 (10/06/2010 13:38) Added new observation of PPM SERL#: [id number]  (10/06/2010 13:38) Added new observation of PPM MODL#: [id number]  (10/06/2010 36:64) Added new observation of PACEMAKERMFG: St Jude  (10/06/2010 13:38) Added new observation of PACEMAKER MD: Lewayne Bunting, MD  (10/06/2010 13:38)      PPM Specifications Following MD:  Lewayne Bunting, MD     PPM Vendor:  St Jude     PPM Model Number:  [id number]     PPM Serial Number:  [id number] PPM DOI:  10/05/2010     PPM Implanting MD:  Lewayne Bunting, MD  Lead 1    Location: RA     DOI: 10/05/2010     Model #: [id number]     Serial #: [id number]     Status: active Lead 2    Location: RV     DOI: 10/05/2010     Model #: [id number]     Serial #: [id number]     Status: active  Magnet Response Rate:  BOL 100 ERI 85  Indications:  Sick sinus syndrome

## 2010-10-29 NOTE — Letter (Signed)
Summary: Internal Other/ PATIENT HISTORY FORM  Internal Other/ PATIENT HISTORY FORM   Imported By: Dorise Hiss 10/07/2010 11:15:45  _____________________________________________________________________  External Attachment:    Type:   Image     Comment:   External Document

## 2010-10-29 NOTE — Consult Note (Signed)
Summary: CARDIOLOGY CONSULT/ [facility]  CARDIOLOGY CONSULT/ [facility]   Imported By: Zachary George 09/16/2010 17:10:05  _____________________________________________________________________  External Attachment:    Type:   Image     Comment:   External Document

## 2010-10-29 NOTE — Procedures (Signed)
Summary: Holter and Event/ [facility] URGENT REPORT  Holter and Event/ [facility] URGENT REPORT   Imported By: Dorise Hiss 10/01/2010 09:40:03  _____________________________________________________________________  External Attachment:    Type:   Image     Comment:   External Document  Appended Document: Holter and Event/ [facility] URGENT REPORT call patient ask about symptoms, dizzines, near syncope or syncope. If negative continue monitor and f/u immediately after cardionet.   Appended Document: Holter and Event/ [facility] URGENT REPORT Had pacer inserted on 1/9.

## 2010-10-29 NOTE — Procedures (Signed)
Summary: Holter and Event/ URGENT CARDIONET REPORT  Holter and Event/ URGENT CARDIONET REPORT   Imported By: Dorise Hiss 10/02/2010 09:38:22  _____________________________________________________________________  External Attachment:    Type:   Image     Comment:   External Document  Appended Document: Holter and Event/ URGENT CARDIONET REPORT the patient needs to see it EP asap. Another post atrial fibrillation prolonged pause. The patient will also need to be started on anticoagulation either with rivaroxaban or Coumadin. Bring patient back next week to discuss .  Appended Document: Holter and Event/ URGENT CARDIONET REPORT Patient is scheduled for pacer today with Dr. [name].    Appended Document: Holter and Event/ URGENT CARDIONET REPORT Tarry Fountain his wife told me in the hall today also. Pacemaker is scheduled and I suspect that Dr. [name] will start anticoagulation if not we will need to do this after the procedure

## 2010-11-03 ENCOUNTER — Encounter (INDEPENDENT_AMBULATORY_CARE_PROVIDER_SITE_OTHER): Payer: Medicare Other

## 2010-11-03 ENCOUNTER — Encounter: Payer: Self-pay | Admitting: Cardiology

## 2010-11-03 DIAGNOSIS — Z7901 Long term (current) use of anticoagulants: Secondary | ICD-10-CM

## 2010-11-03 DIAGNOSIS — I4891 Unspecified atrial fibrillation: Secondary | ICD-10-CM

## 2010-11-03 LAB — CONVERTED CEMR LAB: POC INR: 1.9

## 2010-11-04 NOTE — Cardiovascular Report (Signed)
Summary: Office Visit   Office Visit   Imported By: Roderic Ovens 10/28/2010 15:23:09  _____________________________________________________________________  External Attachment:    Type:   Image     Comment:   External Document

## 2010-11-04 NOTE — Assessment & Plan Note (Signed)
Summary: F1M-AGH/SRS   Vital Signs:  Patient profile:   70 year old male Height:      70 inches Weight:      181 pounds Pulse rate:   61 / minute BP sitting:   129 / 87  (left arm) Cuff size:   regular  Vitals Entered By: Carlye Grippe (October 28, 2010 10:33 AM)  Visit Type:  Follow-up Primary Provider:  none   History of Present Illness: the patient is a 70 year old male with a history of paroxysmal atrial fibrillation as well as long pauses of greater than 6 seconds with tachybradycardia syndrome requiring permanent pacemaker implantation.  The patient states that he sometimes still has symptoms of flushing and mild dizziness.  He feels a rush and going to his head.  It has much improved after the pacemaker and after metoprolol was started.  However pacemaker interrogation reveals that the patient still has intermittent episodes of atrial fibrillation with RVR.  The patient also reports that he still has some of the neurological symptoms have bothered him before.  In particular he still has the numbness and tingling on the right side of his body and sometimes also in the left arm.  I told him that he needs further follow-up with neurology were taken in light of the abnormal MRI which could not rule out a demyelinating process.  Preventive Screening-Counseling & Management  Alcohol-Tobacco     Smoking Status: quit     Year Quit: 1970  Current Medications (verified): 1)  Acetyl L-Carnitine 250 Mg Caps (Acetylcarnitine Hcl) .... Take 1 Tablet By Mouth Once A Day 2)  Resveratrol 100 Mg Caps (Resveratrol) .... Take 2 Tablet By Mouth Once A Day 3)  Testosterone Pellats .... Injected Into Buttocks 4)  Multivitamins  Tabs (Multiple Vitamin) .... Take 1 Tablet By Mouth Once A Day 5)  Citracal Plus  Tabs (Multiple Minerals-Vitamins) .... 1200mg  Take 1 Tablet By Mouth Once A Day 6)  Magnesium 250 Mg Tabs (Magnesium) .... Take 1 Tablet By Mouth Once A Day 7)  Zinc 50 Mg Tabs (Zinc) ....  Take 1 Tablet By Mouth Once A Day 8)  Fish Oil Double Strength 1200 Mg Caps (Omega-3 Fatty Acids) .... Take 1 Tablet By Mouth Once A Day 9)  Vitamin B-12 1000 Mcg Tabs (Cyanocobalamin) .... Take 1 Tablet By Mouth Once A Day 10)  Vitamin D3 2000 Unit Caps (Cholecalciferol) .... Take 1 Tablet By Mouth Once A Day 11)  Probiotic  Caps (Probiotic Product) .... Take 1 Tablet By Mouth Once A Day 12)  Metoprolol Tartrate 25 Mg Tabs (Metoprolol Tartrate) .... Take 1 Tablet By Mouth Two Times A Day 13)  Coumadin 5 Mg Tabs (Warfarin Sodium) .... Take As Directed Per Coumadin Clinic  Allergies (verified): 1)  ! Sulfonamides 2)  ! * Ivp Dye  Comments:  Nurse/Medical Assistant: The patient's medications and allergies were verbally reviewed with the patient and were updated in the Medication and Allergy Lists.  Past History:  Family History: Last updated: September 17, 2010 Father died with prostate cAncer  Social History: Last updated: 09/17/10 Married  Tobacco Use - No.  Alcohol Use - no Drug Use - no  Risk Factors: Smoking Status: quit (10/28/2010)  Past Medical History: Hx of Melanoma depression anxiety Paroxysmal atrial Fibrillation at the time off exercise stress testing six month hs of perioral numbness two month hx of rt. leg numbness episodes of Rt. arm numbness  intermittent visual loss Cardiolite study February 2011 no ischemia  ejection fraction 63% but the patient developed atrial fibrillation during the study abnormal MRI of the head September 2011 no acute intracranial abnormality or focal lesion to explain the patient's symptoms, scattered periventricular and subcortical T2 hyperintensities are greater than expected for age finding is nonspecific but can be seen with chronic microvascular ischemia demyelinating process such as multiple sclerosis, vasculitis or complicated migraine headaches.  Follow-up per neurology sick sinus syndrome, atrial fibrillation with post conversion  pauses status post pacemaker implantation December 2011.  Review of Systems       The patient complains of palpitations and dizziness.  The patient denies fatigue, malaise, fever, weight gain/loss, vision loss, decreased hearing, hoarseness, chest pain, shortness of breath, prolonged cough, wheezing, sleep apnea, coughing up blood, abdominal pain, blood in stool, nausea, vomiting, diarrhea, heartburn, incontinence, blood in urine, muscle weakness, joint pain, leg swelling, rash, skin lesions, headache, fainting, depression, anxiety, enlarged lymph nodes, easy bruising or bleeding, and environmental allergies.         numbness in the extremities  Physical Exam  Additional Exam:  General: Well-developed, well-nourished in no distress head: Normocephalic and atraumatic eyes PERRLA/EOMI intact, conjunctiva and lids normal nose: No deformity or lesions mouth normal dentition, normal posterior pharynx neck: Supple, no JVD.  No masses, thyromegaly or abnormal cervical nodes lungs: Normal breath sounds bilaterally without wheezing.  Normal percussion heart: regular rate and rhythm with normal S1 and S2, no S3 or S4.  PMI is normal.  No pathological murmurs abdomen: Normal bowel sounds, abdomen is soft and nontender without masses, organomegaly or hernias noted.  No hepatosplenomegaly musculoskeletal: Back normal, normal gait muscle strength and tone normal pulsus: Pulse is normal in all 4 extremities Extremities: No peripheral pitting edema neurologic: Alert and oriented x 3 skin: Intact without lesions or rashes cervical nodes: No significant adenopathy psychologic: Normal affect    Impression & Recommendations:  Problem # 1:  SICK SINUS SYNDROME (ICD-427.81) patient is a tachybradycardia syndrome.  He is now in atrial pacing.  He still has episodes of atrial fibrillation with RVR and I increased his metoprolol to 25 milligrams p.o. b.i.d. His updated medication list for this problem  includes:    Metoprolol Tartrate 25 Mg Tabs (Metoprolol tartrate) .Marland Kitchen... Take 1 tablet by mouth two times a day    Coumadin 5 Mg Tabs (Warfarin sodium) .Marland Kitchen... Take as directed per coumadin clinic  Problem # 2:  ATRIAL FIBRILLATION (ICD-427.31) the patient will need to long-term Coumadin.  He will be followed in the Coumadin clinic.  He still subtherapeutic. His updated medication list for this problem includes:    Metoprolol Tartrate 25 Mg Tabs (Metoprolol tartrate) .Marland Kitchen... Take 1 tablet by mouth two times a day    Coumadin 5 Mg Tabs (Warfarin sodium) .Marland Kitchen... Take as directed per coumadin clinic  Orders: EKG w/ Interpretation (93000)  Problem # 3:  NUMBNESS (ICD-782.0) the patient numbness these to be further explained.  This is not explained by possible TIAs.  The finding on MRI raises the question off multiple sclerosis and have asked the patient to follow-up with neurology and possibly consider lumbar puncture if indicated  Patient Instructions: 1)  Increase Metoprolol to 25mg  two times a day  2)  Reschedule follow up with Neurology 3)  Follow up in  6 months Prescriptions: METOPROLOL TARTRATE 25 MG TABS (METOPROLOL TARTRATE) Take 1 tablet by mouth two times a day  #60 x 6   Entered by:   [name], LPN   Authorized by:  Lewayne Bunting, MD, [facility]   Signed by:   [name], LPN on 16/06/9603   Method used:   Electronically to        [facility]* (retail)       [address]       [place], [place]  [address]       Ph: [phone]       Fax: [phone]   RxID:   [id number]    Orders Added: 1)  EKG w/ Interpretation [93000]     EKG  Procedure date:  10/28/2010  Findings:      atrial pacing with a heart rate of 60 beats/min with normal ventricular conduction

## 2010-11-04 NOTE — Medication Information (Signed)
Summary: ccr-lr  Anticoagulant Therapy  Managed by: Vashti Hey, RN Referring MD: Andee Lineman PCP: none Supervising MD: Diona Browner MD, Remi Deter Indication 1: Atrial Fibrillation Lab Used: [facility] [place] Site: [place] INR POC 1.5  Dietary changes: no    Health status changes: no    Bleeding/hemorrhagic complications: no    Recent/future hospitalizations: no    Any changes in medication regimen? no    Recent/future dental: no  Any missed doses?: no       Is patient compliant with meds? yes       Allergies: 1)  ! Sulfonamides 2)  ! * Ivp Dye  Anticoagulation Management History:      The patient is taking warfarin and comes in today for a routine follow up visit.  Positive risk factors for bleeding include an age of 68 years or older and history of CVA/TIA.  The bleeding index is 'intermediate risk'.  Positive CHADS2 values include History of HTN and Prior Stroke/CVA/TIA.  Negative CHADS2 values include Age > 61 years old.  His last INR was 1.02.  Anticoagulation responsible provider: Diona Browner MD, Remi Deter.  INR POC: 1.5.  Cuvette Lot#: [id number].    Anticoagulation Management Assessment/Plan:      The patient's current anticoagulation dose is Coumadin 5 mg tabs: take as directed per coumadin clinic.  The target INR is 2.0-3.0.  The next INR is due 10/27/2010.  Anticoagulation instructions were given to patient.  Results were reviewed/authorized by Vashti Hey, RN.  He was notified by Vashti Hey RN.         Prior Anticoagulation Instructions: INR 1.3 Increase coumadin to 7.5mg  once daily   Current Anticoagulation Instructions: INR 1.5 Increase coumadin to 10mg  once daily except 7.5mg  on Mondays and Fridays Prescriptions: COUMADIN 5 MG TABS (WARFARIN SODIUM) take as directed per coumadin clinic  #60 x 2   Entered by:   Vashti Hey RN   Authorized by:   Lewayne Bunting, MD, [facility]   Signed by:   Vashti Hey RN on 10/27/2010   Method used:   Electronically to        [facility]*  (retail)       [address]       [place], [place]  [address]       Ph: [phone]       Fax: [phone]   RxID:   [id number]

## 2010-11-04 NOTE — Progress Notes (Signed)
  Faxed LOV over to [facility] @ [phone] [facility]  October 28, 2010 11:56 AM

## 2010-11-05 NOTE — Op Note (Signed)
  NAME:  Joshua [name], VASCO NO.:  [id number]  MEDICAL RECORD NO.:  [id number]          PATIENT TYPE:  OIB  LOCATION:  2004                         FACILITY:  [facility]  PHYSICIAN:  Doylene Canning. [name], MD    DATE OF BIRTH:  04-04-1941  DATE OF PROCEDURE:  10/05/2010 DATE OF DISCHARGE:                              OPERATIVE REPORT   PROCEDURE PERFORMED:  Insertion of a dual-chamber pacemaker.  INDICATIONS:  Symptomatic tachy-brady syndrome with pauses of 6 seconds during the daytime documented on CardioNet monitor.  INTRODUCTION:  The patient is a 70 year old male with recurrent episodes of near syncope and dizzy spells.  He was found subsequently to have intermittent pauses of up to 6 seconds (post termination) and is now referred for insertion of a dual-chamber pacemaker.  PROCEDURE:  After informed consent was obtained, the patient was taken to the diagnostic EP lab in a fasting state.  After usual preparation and draping, intravenous fentanyl and midazolam was given for sedation, 30 mL of lidocaine was infiltrated into the left infraclavicular region. A 5-cm incision was carried out over this region.  Electrocautery was utilized to dissect down in the fascial plane.  Left subclavian vein was then punctured x2 and the St. Jude model 2088T 58-cm active fixation pacing lead, serial number [id number] was advanced into the right ventricle.  Mapping was carried out and at the final site, the R-waves are 20 mV.  The pacing impedance was 687 ohms and a threshold 0.8 volts, 0.4 milliseconds.  A 10-V pacing did not stimulate the diaphragm.  With the ventricular lead in satisfactory position,  attention was then turned for placement of the atrial lead, which was placed in the anterolateral portion of the right atrium, where P-waves measured 5 mV, pacing impedance was 480 ohms, and threshold 0.7 volts at 0.4 milliseconds.  Again 10-V pacing did not stimulate diaphragm.  With  both the atrial and ventricular leads in satisfactory position, there was secured to the subpectoral fascia with a figure-of-eight silk suture. The sewing sleeve was secured with silk suture.  Electrocautery was then utilized to make subcutaneous pocket.  The pocket was irrigated with antibiotic irrigation.  The St. Jude Accent RF DR dual-chamber pacemaker, serial number [id number] was connected to the atrial and RV leads and placed back in the subcutaneous pocket, where it was secured with silk suture.  At this point, the pocket was again irrigated with antibiotic irrigation and the incision closed with 2-0 and 3-0 Vicryl. Benzoin and Steri-Strips were painted on skin.  A pressure dressing was applied and the patient was returned to his room in satisfactory condition.  COMPLICATIONS:  There were no immediate procedure complications.  RESULTS:  Demonstrate successful implantation of a St. Jude dual-chamber pacemaker.  The patient was symptomatic bradycardia with pauses of up to 6 seconds.     Doylene Canning. [name], MD     GWT/MEDQ  D:  10/05/2010  T:  10/06/2010  Job:  [id number]  cc:   Learta Codding, MD,FACC  Electronically Signed by Lewayne Bunting MD on 11/05/2010 05:10:17 PM

## 2010-11-05 NOTE — Discharge Summary (Signed)
NAME:  Joshua [name], Joshua [name] NO.:  [id number]  MEDICAL RECORD NO.:  [id number]          PATIENT TYPE:  OIB  LOCATION:  2004                         FACILITY:  [facility]  PHYSICIAN:  Doylene Canning. [name], MD    DATE OF BIRTH:  04-28-1941  DATE OF ADMISSION:  10/05/2010 DATE OF DISCHARGE:  10/06/2010                              DISCHARGE SUMMARY   PROCEDURE:  Insertion of a St. Jude Accent dual-lead pacemaker.  PRIMARY FINAL DISCHARGE DIAGNOSIS:  Symptomatic tachy-brady syndrome with pauses of 6 seconds documented.  SECONDARY DIAGNOSES: 1. Paroxysmal atrial fibrillation. 2. History of melanoma. 3. Depression/anxiety. 4. 58-month history of perioral numbness. 5. 58-month history of right leg numbness with episodes of right arm     numbness and intermittent visual loss. 6. Status post stress test in February 2011 showing no ischemia,     ejection fraction 53%. 7. Status post MRI with possible chronic microvascular ischemia     demyelinating process, to follow up with Neurology. 8. Remote history of tobacco use. 9. Allergy or intolerance to SULFA and IV DYE. 10.Status post melanoma removal.  TIME AT DISCHARGE:  34 minutes.  HOSPITAL COURSE:  Joshua [name] is a 70 year old male with no history of coronary artery disease.  He was having pauses and our CardioNet monitor showed paroxysmal atrial fibrillation with post-termination pauses of up to 6 seconds.  He was symptomatic with these and pacemaker was indicated.  He came to the hospital for the procedure on October 05, 2010.  He had a dual-lead St. Jude Accent RF/DR pacemaker inserted without complication.  On October 06, 2010, he had some pulmonary venous congestion, but his O2 saturation was good on room air at 98%.  He was having no chest pain or shortness of breath.  He had no complications from the pacemaker.  He was evaluated by Dr. [name] and considered stable for discharge, to follow up as an outpatient.  DISCHARGE  INSTRUCTIONS:  His activity level is to be increased gradually per the discharge instruction sheet.  He is to call our office for problems with the incision.  He is to get a wound check on October 19, 2009, at 10:30 and follow up with Dr. [name] in 3 months, the office will call.  He is to follow up with Dr. Andee Lineman and Dr. Marjory Lies as needed.  DISCHARGE MEDICATIONS: 1. Prednisone is discontinued. 2. Testosterone pellet injected every 3 months. 3. Fish oil 1 g daily. 4. Magnesium sulfate over the counter daily. 5. Benadryl 25 mg as directed. 6. Multivitamins daily. 7. Aspirin 81 mg a day. 8. Melatonin OTC at bedtime p.r.n. 9. Citracal daily. 10.Zinc sulfate daily. 11.Acetylcarnitine daily. 12.Probiotic daily. 13.__________ 200 mg daily. 14.Vitamin B12 of 1000 mcg daily. 15.Vitamin D3 of 1000 units 2 tablets daily.     Theodore Demark, PA-C   ______________________________ Doylene Canning. [name], MD    RB/MEDQ  D:  10/06/2010  T:  10/07/2010  Job:  [id number]  cc:   Joycelyn Schmid, MD  Electronically Signed by Theodore Demark PA-C on 10/13/2010 04:49:45 PM Electronically Signed by Lewayne Bunting MD on 11/05/2010 05:10:21  PM

## 2010-11-09 ENCOUNTER — Ambulatory Visit (INDEPENDENT_AMBULATORY_CARE_PROVIDER_SITE_OTHER): Payer: Medicare Other | Admitting: Internal Medicine

## 2010-11-09 ENCOUNTER — Encounter: Payer: Self-pay | Admitting: Internal Medicine

## 2010-11-09 DIAGNOSIS — R002 Palpitations: Secondary | ICD-10-CM

## 2010-11-09 DIAGNOSIS — I495 Sick sinus syndrome: Secondary | ICD-10-CM

## 2010-11-09 DIAGNOSIS — I4891 Unspecified atrial fibrillation: Secondary | ICD-10-CM

## 2010-11-12 NOTE — Medication Information (Signed)
Summary: ccr-lr  Lab Visit  Orders Today:  Anticoagulant Therapy  Managed by: Vashti Hey, RN Referring MD: Andee Lineman PCP: none Supervising MD: Andee Lineman MD, Michelle Piper Indication 1: Atrial Fibrillation Lab Used: [facility] [place] Site: [facility] INR POC 1.9  Dietary changes: no    Health status changes: no    Bleeding/hemorrhagic complications: no    Recent/future hospitalizations: no    Any changes in medication regimen? no    Recent/future dental: no  Any missed doses?: no       Is patient compliant with meds? yes         Anticoagulation Management History:      The patient is taking warfarin and comes in today for a routine follow up visit.  Positive risk factors for bleeding include an age of 68 years or older and history of CVA/TIA.  The bleeding index is 'intermediate risk'.  Positive CHADS2 values include History of HTN and Prior Stroke/CVA/TIA.  Negative CHADS2 values include Age > 31 years old.  His last INR was 1.02.  Anticoagulation responsible provider: Andee Lineman MD, Michelle Piper.  INR POC: 1.9.  Cuvette Lot#: [id number].    Anticoagulation Management Assessment/Plan:      The patient's current anticoagulation dose is Coumadin 5 mg tabs: take as directed per coumadin clinic.  The target INR is 2.0-3.0.  The next INR is due 11/13/2010.  Anticoagulation instructions were given to patient.  Results were reviewed/authorized by Vashti Hey, RN.  He was notified by Vashti Hey RN.         Prior Anticoagulation Instructions: INR 1.5 Increase coumadin to 10mg  once daily except 7.5mg  on Mondays and Fridays  Current Anticoagulation Instructions: INR 1.9 Take coumadin 2 1/2 tablets tonight then increase dose to 2 tablets once daily

## 2010-11-12 NOTE — Procedures (Addendum)
Summary: Holter and Event/ CARDIONET END OF SERVICE SUMMARY REPORT  Holter and Event/ CARDIONET END OF SERVICE SUMMARY REPORT   Imported By: Dorise Hiss 10/29/2010 12:29:43  _____________________________________________________________________  External Attachment:    Type:   Image     Comment:   External Document  Appended Document: Holter and Event/ CARDIONET END OF SERVICE SUMMARY REPORT this patient received a pacemaker for post conversion pauses. However he has frequent episodes of rapid atrial flutter which appears to be a typical flutter. We increased his medications, i.e. his beta blocker that the patient should be considered for flutter ablation because his symptoms of palpitations and flushing are still quite frequent.we can probably further increase his medications but I do think a flutter ablation may be indicated for the patient. Please review this.  Appended Document: Holter and Event/ CARDIONET END OF SERVICE SUMMARY REPORT Left message to return call.   Appended Document: Holter and Event/ CARDIONET END OF SERVICE SUMMARY REPORT Wife Drinda Butts) notified of above.   Appointment scheduled for:    Monday, February 13 at 3:45 with Dr. [name] at [facility]. office. Wife aware.    Appended Document: Holter and Event/ CARDIONET END OF SERVICE SUMMARY REPORT Michelle Piper,  He also has atrial fib. I have recommended a trial of flecainide with ablation if symptomatic flutter and or fib return and are not well controlled. Sharlot Gowda  Appended Document: Holter and Event/ CARDIONET END OF SERVICE SUMMARY REPORT Yes, great- i appreciate it. Michelle Piper

## 2010-11-13 ENCOUNTER — Encounter: Payer: Self-pay | Admitting: Cardiology

## 2010-11-13 ENCOUNTER — Encounter (INDEPENDENT_AMBULATORY_CARE_PROVIDER_SITE_OTHER): Payer: Medicare Other

## 2010-11-13 DIAGNOSIS — I4891 Unspecified atrial fibrillation: Secondary | ICD-10-CM

## 2010-11-13 DIAGNOSIS — Z7901 Long term (current) use of anticoagulants: Secondary | ICD-10-CM

## 2010-11-13 LAB — CONVERTED CEMR LAB: POC INR: 2.1

## 2010-11-18 NOTE — Miscellaneous (Signed)
Summary: GXT order  Clinical Lists Changes  Orders: Added new Referral order of Treadmill (Treadmill) - Signed

## 2010-11-18 NOTE — Assessment & Plan Note (Signed)
Summary: rov. work up for ablation. per [facility]ail office [phone]. gd   Visit Type:  PPM-St.Jude Primary Provider:  none  CC:  discomfort around pacemaker, chest pain, dizziness, and headaches-pressure.  History of Present Illness: Joshua [name] is referred back today by Dr. Andee Lineman for recurrent atrial arrhythmias after PPM insertion. When I initially saw Joshua [name] he had recurrent episodes of near syncope and was documented to have long pauses for which he underwent PPM and was then placed back on his metoprolol. He has done better but has continued to have some symptoms which appear to be due to atrial fib and flutter. He clearly has both.  He denies c/p and does not have a h/o CAD. No edema. His minimal discomfort around his PPM incision.  Current Medications (verified): 1)  Acetyl L-Carnitine 250 Mg Caps (Acetylcarnitine Hcl) .... Take 1 Tablet By Mouth Once A Day 2)  Resveratrol 100 Mg Caps (Resveratrol) .... Take 2 Tablet By Mouth Once A Day 3)  Testosterone Pellats .... Injected Into Buttocks 4)  Multivitamins  Tabs (Multiple Vitamin) .... Take 1 Tablet By Mouth Once A Day 5)  Citracal Plus  Tabs (Multiple Minerals-Vitamins) .... 1200mg  Take 1 Tablet By Mouth Once A Day 6)  Magnesium 250 Mg Tabs (Magnesium) .... Take 1 Tablet By Mouth Once A Day 7)  Zinc 50 Mg Tabs (Zinc) .... Take 1 Tablet By Mouth Once A Day 8)  Fish Oil Double Strength 1200 Mg Caps (Omega-3 Fatty Acids) .... Take 1 Tablet By Mouth Once A Day 9)  Vitamin B-12 1000 Mcg Tabs (Cyanocobalamin) .... Take 1 Tablet By Mouth Once A Day 10)  Vitamin D3 2000 Unit Caps (Cholecalciferol) .... Take 1 Tablet By Mouth Once A Day 11)  Probiotic  Caps (Probiotic Product) .... Take 1 Tablet By Mouth Once A Day 12)  Metoprolol Tartrate 25 Mg Tabs (Metoprolol Tartrate) .... Take 1 Tablet By Mouth Two Times A Day 13)  Coumadin 5 Mg Tabs (Warfarin Sodium) .... Take As Directed Per Coumadin Clinic  Allergies (verified): 1)  ! Sulfonamides 2)   ! * Ivp Dye  Past History:  Past Medical History: Last updated: 10/28/2010 Hx of Melanoma depression anxiety Paroxysmal atrial Fibrillation at Joshua time off exercise stress testing six month hs of perioral numbness two month hx of rt. leg numbness episodes of Rt. arm numbness  intermittent visual loss Cardiolite study February 2011 no ischemia ejection fraction 63% but Joshua [name] developed atrial fibrillation during Joshua study abnormal MRI of Joshua head September 2011 no acute intracranial abnormality or focal lesion to explain Joshua [name]'s symptoms, scattered periventricular and subcortical T2 hyperintensities are greater than expected for age finding is nonspecific but can be seen with chronic microvascular ischemia demyelinating process such as multiple sclerosis, vasculitis or complicated migraine headaches.  Follow-up per neurology sick sinus syndrome, atrial fibrillation with post conversion pauses status post pacemaker implantation December 2011.  Review of Systems  Joshua [name] denies chest pain, syncope, dyspnea on exertion, and peripheral edema.    Vital Signs:  [name] profile:   70 year old male Height:      70 inches Weight:      182 pounds BMI:     26.21 Pulse rate:   76 / minute BP sitting:   142 / 78  (left arm) Cuff size:   regular  Vitals Entered By: Caralee Ates CMA (November 09, 2010 4:13 PM)  Physical Exam  General:  Well developed, well nourished, in no acute distress.  HEENT: normal Neck: supple. No JVD. Carotids 2+ bilaterally no bruits Cor: RRR no rubs, gallops or murmur Lungs: CTA. Well healed PPM incision. Ab: soft, nontender. nondistended. No HSM. Good bowel sounds Ext: warm. no cyanosis, clubbing or edema Neuro: alert and oriented. Grossly nonfocal. affect pleasant    EKG  Procedure date:  10/28/2010  Findings:      Normal sinus rhythm with rate of:  60. Atrium is paced.    PPM Specifications Following MD:  Lewayne Bunting, MD     PPM  Vendor:  St Jude     PPM Model Number:  [id number]     PPM Serial Number:  [id number] PPM DOI:  10/05/2010     PPM Implanting MD:  Lewayne Bunting, MD  Lead 1    Location: [facility]     DOI: 10/05/2010     Model #: [id number]     Serial #: [id number]     Status: active Lead 2    Location: RV     DOI: 10/05/2010     Model #: [id number]     Serial #: [id number]     Status: active  Magnet Response Rate:  BOL 100 ERI 85  Indications:  Sick sinus syndrome   Parameters Mode:  DDD     Lower Rate Limit:  60     Upper Rate Limit:  110 Paced AV Delay:  250     Sensed AV Delay:  250  Impression & Recommendations:  Problem # 1:  ATRIAL FIBRILLATION (ICD-427.31) Joshua [name] has both atrial fib and typical flutter. I have discussed Joshua treatment options with he and his wife. I have recommended that we proceed with flecainide initiation. If this helps control his symptoms then we would refer him back to Dr. Andee Lineman. If he were to develop atrial flutter but not fib Joshua flutter ablation would be recommended. If he continues to have both symptomatic fib and flutter then ablation of both would be recommended. Will follow. His updated medication list for this problem includes:    Metoprolol Tartrate 25 Mg Tabs (Metoprolol tartrate) .Marland Kitchen... Take 1 tablet by mouth two times a day    Coumadin 5 Mg Tabs (Warfarin sodium) .Marland Kitchen... Take as directed per coumadin clinic    Flecainide Acetate 100 Mg Tabs (Flecainide acetate) ..... One by mouth two times a day  Problem # 2:  ESSENTIAL HYPERTENSION, BENIGN (ICD-401.1) His pressure is fairly well controlled. Would consider uptitration of his beta blocker if his symptoms return. His updated medication list for this problem includes:    Metoprolol Tartrate 25 Mg Tabs (Metoprolol tartrate) .Marland Kitchen... Take 1 tablet by mouth two times a day  Problem # 3:  OTHER MALAISE AND FATIGUE (ICD-780.79) I suspect this is due to his atrial fib/flutter though it could also be related to his beta blocker.  [name]  Instructions: 1)  Your physician has requested that you have an exercise tolerance test.  For further information please visit [email or web address]  Please also follow instruction sheet, as given. 2)  Your physician has recommended you make Joshua following change in your medication: start Flecainide 100mg  two times a day Prescriptions: FLECAINIDE ACETATE 100 MG TABS (FLECAINIDE ACETATE) one by mouth two times a day  #60 x 6   Entered by:   Dennis Bast, RN, BSN   Authorized by:   Laren Boom, MD, [facility]   Signed by:   Dennis Bast, RN, BSN on 11/09/2010   Method used:   Electronically to  [facility]* (retail)       [address]       [place], [place]  [address]       Ph: [phone]       Fax: [phone]   RxID:   [id number]

## 2010-11-18 NOTE — Medication Information (Signed)
Summary: ccr-lr  Anticoagulant Therapy  Managed by: Vashti Hey, RN Referring MD: Andee Lineman PCP: none Supervising MD: Myrtis Ser MD, Tinnie Gens Indication 1: Atrial Fibrillation Lab Used: [facility] [place] Site: [facility] INR POC 2.1  Dietary changes: no    Health status changes: no    Bleeding/hemorrhagic complications: no    Recent/future hospitalizations: no    Any changes in medication regimen? no    Recent/future dental: no  Any missed doses?: no       Is patient compliant with meds? yes       Allergies: 1)  ! Sulfonamides 2)  ! * Ivp Dye  Anticoagulation Management History:      The patient is taking warfarin and comes in today for a routine follow up visit.  Positive risk factors for bleeding include an age of 68 years or older and history of CVA/TIA.  The bleeding index is 'intermediate risk'.  Positive CHADS2 values include History of HTN and Prior Stroke/CVA/TIA.  Negative CHADS2 values include Age > 58 years old.  His last INR was 1.02.  Anticoagulation responsible provider: Myrtis Ser MD, Tinnie Gens.  INR POC: 2.1.  Cuvette Lot#: [id number].    Anticoagulation Management Assessment/Plan:      The patient's current anticoagulation dose is Coumadin 5 mg tabs: take as directed per coumadin clinic.  The target INR is 2.0-3.0.  The next INR is due 11/27/2010.  Anticoagulation instructions were given to patient.  Results were reviewed/authorized by Vashti Hey, RN.  He was notified by Vashti Hey RN.         Prior Anticoagulation Instructions: INR 1.9 Take coumadin 2 1/2 tablets tonight then increase dose to 2 tablets once daily   Current Anticoagulation Instructions: INR 2.1 Continue coumadin 10mg  once daily

## 2010-11-24 ENCOUNTER — Telehealth: Payer: Self-pay | Admitting: Internal Medicine

## 2010-11-25 ENCOUNTER — Encounter (INDEPENDENT_AMBULATORY_CARE_PROVIDER_SITE_OTHER): Payer: Medicare Other

## 2010-11-25 ENCOUNTER — Encounter (INDEPENDENT_AMBULATORY_CARE_PROVIDER_SITE_OTHER): Payer: Medicare Other | Admitting: Internal Medicine

## 2010-11-25 ENCOUNTER — Encounter: Payer: Self-pay | Admitting: Internal Medicine

## 2010-11-25 DIAGNOSIS — I4891 Unspecified atrial fibrillation: Secondary | ICD-10-CM

## 2010-11-25 DIAGNOSIS — R0989 Other specified symptoms and signs involving the circulatory and respiratory systems: Secondary | ICD-10-CM

## 2010-11-27 ENCOUNTER — Encounter: Payer: Self-pay | Admitting: Cardiology

## 2010-11-27 ENCOUNTER — Encounter (INDEPENDENT_AMBULATORY_CARE_PROVIDER_SITE_OTHER): Payer: Medicare Other

## 2010-11-27 DIAGNOSIS — Z7901 Long term (current) use of anticoagulants: Secondary | ICD-10-CM

## 2010-11-27 DIAGNOSIS — I4891 Unspecified atrial fibrillation: Secondary | ICD-10-CM

## 2010-11-27 LAB — CONVERTED CEMR LAB: POC INR: 2.4

## 2010-12-03 NOTE — Progress Notes (Signed)
Summary: medication question  Phone Note Call from Patient Call back at Home Phone [phone]   Caller: Patient Reason for Call: Talk to Nurse, Talk to Doctor Summary of Call: pt has treadmill tomorrow and he needs to know dose he need to take his flecinide or not ok to leave  message Initial call taken by: Omer Jack,  November 24, 2010 9:25 AM  Follow-up for Phone Call        Pt. would like to know if he needs to hold the Flecainide prior having  a treadmill. Pt. is to take the Flecainide do not hold medication. Pt. verbalized understanding. Follow-up by: Ollen Gross, RN, BSN,  November 24, 2010 10:42 AM

## 2010-12-03 NOTE — Medication Information (Signed)
Summary: ccr-lr  Anticoagulant Therapy  Managed by: Vashti Hey, RN Referring MD: Andee Lineman PCP: none Supervising MD: Andee Lineman MD, Michelle Piper Indication 1: Atrial Fibrillation Lab Used: [facility] [place] Site: [facility] INR POC 2.4  Dietary changes: no    Health status changes: no    Bleeding/hemorrhagic complications: no    Recent/future hospitalizations: no    Any changes in medication regimen? no    Recent/future dental: no  Any missed doses?: no       Is patient compliant with meds? yes       Allergies: 1)  ! Sulfonamides 2)  ! * Ivp Dye  Anticoagulation Management History:      The patient is taking warfarin and comes in today for a routine follow up visit.  Positive risk factors for bleeding include an age of 68 years or older and history of CVA/TIA.  The bleeding index is 'intermediate risk'.  Positive CHADS2 values include History of HTN and Prior Stroke/CVA/TIA.  Negative CHADS2 values include Age > 68 years old.  His last INR was 1.02.  Anticoagulation responsible provider: Andee Lineman MD, Michelle Piper.  INR POC: 2.4.  Cuvette Lot#: [id number].    Anticoagulation Management Assessment/Plan:      The patient's current anticoagulation dose is Coumadin 5 mg tabs: take as directed per coumadin clinic.  The target INR is 2.0-3.0.  The next INR is due 12/18/2010.  Anticoagulation instructions were given to patient.  Results were reviewed/authorized by Vashti Hey, RN.  He was notified by Vashti Hey RN.         Prior Anticoagulation Instructions: INR 2.1 Continue coumadin 10mg  once daily   Current Anticoagulation Instructions: INR 2.4 Continue coumadin 10mg  once daily

## 2010-12-16 ENCOUNTER — Encounter: Payer: Self-pay | Admitting: *Deleted

## 2010-12-16 DIAGNOSIS — Z7901 Long term (current) use of anticoagulants: Secondary | ICD-10-CM

## 2010-12-16 DIAGNOSIS — G459 Transient cerebral ischemic attack, unspecified: Secondary | ICD-10-CM

## 2010-12-16 DIAGNOSIS — I4891 Unspecified atrial fibrillation: Secondary | ICD-10-CM

## 2010-12-18 ENCOUNTER — Ambulatory Visit (INDEPENDENT_AMBULATORY_CARE_PROVIDER_SITE_OTHER): Payer: Medicare Other | Admitting: *Deleted

## 2010-12-18 DIAGNOSIS — G459 Transient cerebral ischemic attack, unspecified: Secondary | ICD-10-CM

## 2010-12-18 DIAGNOSIS — Z7901 Long term (current) use of anticoagulants: Secondary | ICD-10-CM

## 2010-12-18 DIAGNOSIS — I4891 Unspecified atrial fibrillation: Secondary | ICD-10-CM

## 2010-12-18 LAB — POCT INR: INR: 2.2

## 2011-01-12 ENCOUNTER — Ambulatory Visit (INDEPENDENT_AMBULATORY_CARE_PROVIDER_SITE_OTHER): Payer: Medicare Other | Admitting: *Deleted

## 2011-01-12 DIAGNOSIS — G459 Transient cerebral ischemic attack, unspecified: Secondary | ICD-10-CM

## 2011-01-12 DIAGNOSIS — Z7901 Long term (current) use of anticoagulants: Secondary | ICD-10-CM

## 2011-01-12 DIAGNOSIS — I4891 Unspecified atrial fibrillation: Secondary | ICD-10-CM

## 2011-01-12 LAB — POCT INR: INR: 2

## 2011-01-19 ENCOUNTER — Encounter: Payer: Self-pay | Admitting: Internal Medicine

## 2011-01-19 ENCOUNTER — Ambulatory Visit (INDEPENDENT_AMBULATORY_CARE_PROVIDER_SITE_OTHER): Payer: Medicare Other | Admitting: Internal Medicine

## 2011-01-19 ENCOUNTER — Other Ambulatory Visit: Payer: Self-pay | Admitting: Internal Medicine

## 2011-01-19 DIAGNOSIS — I4891 Unspecified atrial fibrillation: Secondary | ICD-10-CM

## 2011-01-19 DIAGNOSIS — I495 Sick sinus syndrome: Secondary | ICD-10-CM

## 2011-01-19 DIAGNOSIS — Z950 Presence of cardiac pacemaker: Secondary | ICD-10-CM

## 2011-01-19 DIAGNOSIS — R42 Dizziness and giddiness: Secondary | ICD-10-CM

## 2011-01-19 NOTE — Progress Notes (Signed)
HPI Joshua [name] returns today for followup. Is a pleasant 70 year old man with history of symptomatic tachybradycardia syndrome and paroxysmal atrial fibrillation. The patient was found to have long pauses and underwent permanent pacemaker insertion. Since then he has continued to have problems with fatigue, nausea, chest discomfort, and flushing sensations. He also has episodes of orthostasis. He notes that all of the symptoms began to occur after being started on beta blockers and flecainide. Prior to this he was having symptomatic atrial fib. He has had no recurrent syncope. He denies fevers or chills. He does complain of diaphoresis. His chest discomfort and diaphoresis are not related to exertion. Previously an exercise treadmill test was negative for ischemia. Allergies  Allergen Reactions  . Sulfonamide Derivatives      Current Outpatient Prescriptions  Medication Sig Dispense Refill  . Calcium Citrate-Vitamin D (CITRACAL MAXIMUM PO) Take by mouth daily.        . Cyanocobalamin (VITAMIN B12 PO) Take by mouth daily.        . Magnesium Oxide (MAG-CAPS PO) Take by mouth daily.        . Multiple Vitamin (MULTIVITAMIN) capsule Take 1 capsule by mouth daily.        . Multiple Vitamins-Minerals (ZINC PO) Take by mouth daily.        . Probiotic Product (PROBIOTIC PO) Take by mouth daily.        Marland Kitchen DISCONTD: flecainide (TAMBOCOR) 100 MG tablet Take 1 tablet by mouth Twice daily.      Marland Kitchen DISCONTD: metoprolol tartrate (LOPRESSOR) 25 MG tablet Take 1 tablet by mouth Twice daily.      Marland Kitchen warfarin (COUMADIN) 5 MG tablet Take by mouth as directed.           Past Medical History  Diagnosis Date  . Essential hypertension, benign   . ERYTHROCYTOSIS   . Atrial fibrillation   . SICK SINUS SYNDROME   . DIZZINESS   . Other malaise and fatigue   . Painful respiration   . NUMBNESS   . CHEST PAIN UNSPECIFIED   . TIA     ROS:   All systems reviewed and negative except as noted in the HPI.   Past  Surgical History  Procedure Date  . Pacemaker insertion      Family History  Problem Relation Age of Onset  . Prostate cancer Father      History   Social History  . Marital Status: Married    Spouse Name: N/A    Number of Children: N/A  . Years of Education: N/A   Occupational History  . Not on file.   Social History Main Topics  . Smoking status: Former Games developer  . Smokeless tobacco: Not on file   Comment: quit in 1976  . Alcohol Use: Not on file  . Drug Use: Not on file  . Sexually Active: Not on file   Other Topics Concern  . Not on file   Social History Narrative  . No narrative on file     BP 132/96  Pulse 60  Ht 5\' 10"  (1.778 m)  Wt 179 lb (81.194 kg)  BMI 25.68 kg/m2  Physical Exam:  Well appearing NAD HEENT: Unremarkable Neck:  No JVD, no thyromegally Lymphatics:  No adenopathy Back:  No CVA tenderness Lungs:  Clear. Well-healed pacemaker incision. HEART:  Regular rate rhythm, no murmurs, no rubs, no clicks Abd:  Flat, positive bowel sounds, no organomegally, no rebound, no guarding Ext:  2 plus pulses, no edema,  no cyanosis, no clubbing Skin:  No rashes no nodules Neuro:  CN II through XII intact, motor grossly intact  DEVICE  Normal device function.  See PaceArt for details.   Assess/Plan:

## 2011-01-19 NOTE — Patient Instructions (Signed)
Your physician wants you to follow-up in: 4 months with Dr Court Joy will receive a reminder letter in the mail two months in advance. If you don't receive a letter, please call our office to schedule the follow-up appointment.  Your physician has recommended you make the following change in your medication: stop Flecainide and stop Lopressor

## 2011-01-19 NOTE — Assessment & Plan Note (Signed)
Now that we have discontinued his flecainide and Lopressor, I suspect more atrial arrhythmias. The plan would be that if these recur that we would strongly consider additional antiarrhythmic drug therapy either with Tikosyn or Multaq.

## 2011-01-19 NOTE — Assessment & Plan Note (Signed)
His device is working normally. We'll recheck in several months. 

## 2011-01-19 NOTE — Assessment & Plan Note (Signed)
The patient has a multitude of symptoms including dizziness. He states that these occurred when he started his medications. I have asked him to stop the left side and Lopressor. It is very likely that he will have more atrial arrhythmias.

## 2011-01-26 ENCOUNTER — Other Ambulatory Visit: Payer: Self-pay | Admitting: *Deleted

## 2011-01-26 MED ORDER — WARFARIN SODIUM 5 MG PO TABS: 5.0000 mg | ORAL_TABLET | ORAL | Status: DC

## 2011-02-09 ENCOUNTER — Ambulatory Visit (INDEPENDENT_AMBULATORY_CARE_PROVIDER_SITE_OTHER): Payer: Medicare Other | Admitting: *Deleted

## 2011-02-09 DIAGNOSIS — G459 Transient cerebral ischemic attack, unspecified: Secondary | ICD-10-CM

## 2011-02-09 DIAGNOSIS — I4891 Unspecified atrial fibrillation: Secondary | ICD-10-CM

## 2011-02-09 DIAGNOSIS — Z7901 Long term (current) use of anticoagulants: Secondary | ICD-10-CM

## 2011-02-09 LAB — POCT INR: INR: 1.9

## 2011-02-26 ENCOUNTER — Ambulatory Visit (INDEPENDENT_AMBULATORY_CARE_PROVIDER_SITE_OTHER): Payer: Medicare Other | Admitting: *Deleted

## 2011-02-26 DIAGNOSIS — I4891 Unspecified atrial fibrillation: Secondary | ICD-10-CM

## 2011-02-26 DIAGNOSIS — Z7901 Long term (current) use of anticoagulants: Secondary | ICD-10-CM

## 2011-02-26 DIAGNOSIS — G459 Transient cerebral ischemic attack, unspecified: Secondary | ICD-10-CM

## 2011-02-26 LAB — POCT INR: INR: 1.8

## 2011-03-02 ENCOUNTER — Telehealth: Payer: Self-pay | Admitting: Internal Medicine

## 2011-03-02 NOTE — Telephone Encounter (Signed)
Pt having pain around pacemaker, having a flutter was told by dr [name] to call and be seen if this happens, called christine and we offered him today at 345p, pt declined saying he had a date with his wife today at 5 and couldn't possibly do that-he's booked until 7-3-pls advise

## 2011-03-10 ENCOUNTER — Encounter: Payer: Self-pay | Admitting: Internal Medicine

## 2011-03-10 NOTE — Telephone Encounter (Signed)
SPOKE WITH PT CONT TO C/O PAIN AT DEVICE SITE NO CHANGE IN INTENSITY HAS APPT WITH DR [name] IN [place] OFF ON 04/05/11  REFUSED EARLIER  PA APPT./CY

## 2011-03-16 ENCOUNTER — Ambulatory Visit (INDEPENDENT_AMBULATORY_CARE_PROVIDER_SITE_OTHER): Payer: Medicare Other | Admitting: *Deleted

## 2011-03-16 DIAGNOSIS — G459 Transient cerebral ischemic attack, unspecified: Secondary | ICD-10-CM

## 2011-03-16 DIAGNOSIS — Z7901 Long term (current) use of anticoagulants: Secondary | ICD-10-CM

## 2011-03-16 DIAGNOSIS — I4891 Unspecified atrial fibrillation: Secondary | ICD-10-CM

## 2011-03-30 ENCOUNTER — Encounter: Payer: Self-pay | Admitting: Internal Medicine

## 2011-04-05 ENCOUNTER — Ambulatory Visit (INDEPENDENT_AMBULATORY_CARE_PROVIDER_SITE_OTHER): Payer: Medicare Other | Admitting: Internal Medicine

## 2011-04-05 ENCOUNTER — Encounter: Payer: Self-pay | Admitting: Internal Medicine

## 2011-04-05 DIAGNOSIS — Z950 Presence of cardiac pacemaker: Secondary | ICD-10-CM

## 2011-04-05 DIAGNOSIS — R079 Chest pain, unspecified: Secondary | ICD-10-CM

## 2011-04-05 DIAGNOSIS — I495 Sick sinus syndrome: Secondary | ICD-10-CM

## 2011-04-05 DIAGNOSIS — I4891 Unspecified atrial fibrillation: Secondary | ICD-10-CM

## 2011-04-05 LAB — PACEMAKER DEVICE OBSERVATION
AL AMPLITUDE: 4 mv
ATRIAL PACING PM: 64
BAMS-0001: 150 {beats}/min
BAMS-0003: 70 {beats}/min
BATTERY VOLTAGE: 2.9779 V
VENTRICULAR PACING PM: 4.7

## 2011-04-05 NOTE — Assessment & Plan Note (Signed)
He is maintaining sinus rhythm 98-99% of the time. I discussed the possibility of adding an additional antiarrhythmic drug. After much reflection we have decided to hold off on this for the present time though if his atrial fibrillation burden worsens medical rhythm control therapy would be a consideration.

## 2011-04-05 NOTE — Progress Notes (Signed)
HPI [name] returns today for followup. He is a very pleasant 70 year old man with a history of symptomatic tachycardia bradycardia syndrome, status post permanent pacemaker insertion. After his pacemaker was placed, he was begun on antiarrhythmic drug therapy with flecainide. He was very intolerant of this medication and it was stopped. The patient has gradually improved. Initially he had pain in his insertion site and this is better. He also continues to have intermittent chest pain. This is nonexertional. He was recently seen in the emergency room where he was ruled out for MI. The patient has not had syncope. There is not a good correlation between episodes of atrial fibrillation and chest pain. He denies peripheral edema. Allergies  Allergen Reactions  . Sulfonamide Derivatives      Current Outpatient Prescriptions  Medication Sig Dispense Refill  . aspirin 81 MG tablet Take 81 mg by mouth daily.        . Calcium Citrate-Vitamin D (CITRACAL MAXIMUM PO) Take by mouth daily.        . Cyanocobalamin (VITAMIN B12 PO) Take by mouth daily.        . Magnesium Oxide (MAG-CAPS PO) Take by mouth daily.        . metoprolol succinate (TOPROL-XL) 25 MG 24 hr tablet Take 25 mg by mouth daily.        . Misc Natural Products (PROGESTERONE EX) Apply topically.        . Multiple Vitamin (MULTIVITAMIN) capsule Take 1 capsule by mouth daily.        . Multiple Vitamins-Minerals (ZINC PO) Take by mouth daily.        . Probiotic Product (PROBIOTIC PO) Take by mouth daily.        Marland Kitchen warfarin (COUMADIN) 5 MG tablet Take 1 tablet (5 mg total) by mouth as directed.  60 tablet  2     Past Medical History  Diagnosis Date  . Essential hypertension, benign   . ERYTHROCYTOSIS   . Atrial fibrillation   . SICK SINUS SYNDROME   . DIZZINESS   . Other malaise and fatigue   . Painful respiration   . NUMBNESS   . CHEST PAIN UNSPECIFIED   . TIA     ROS:   All systems reviewed and negative except as noted in the  HPI.   Past Surgical History  Procedure Date  . Pacemaker insertion      Family History  Problem Relation Age of Onset  . Prostate cancer Father      History   Social History  . Marital Status: Married    Spouse Name: N/A    Number of Children: N/A  . Years of Education: N/A   Occupational History  . Not on file.   Social History Main Topics  . Smoking status: Former Games developer  . Smokeless tobacco: Never Used   Comment: quit in 1976  . Alcohol Use: No  . Drug Use: No  . Sexually Active: Not on file   Other Topics Concern  . Not on file   Social History Narrative  . No narrative on file     BP 130/85  Pulse 84  Ht 5\' 10"  (1.778 m)  Wt 182 lb (82.555 kg)  BMI 26.11 kg/m2  SpO2 95%  Physical Exam:  Well appearing, middle-aged man, NAD HEENT: Unremarkable Neck:  No JVD, no thyromegally Lymphatics:  No adenopathy Back:  No CVA tenderness Lungs:  Clear. Well-healed pacemaker incision HEART:  Regular rate rhythm, no murmurs, no rubs,  no clicks Abd:  soft, positive bowel sounds, no organomegally, no rebound, no guarding Ext:  2 plus pulses, no edema, no cyanosis, no clubbing Skin:  No rashes no nodules Neuro:  CN II through XII intact, motor grossly intact  DEVICE  Normal device function.  See PaceArt for details.   Assess/Plan:

## 2011-04-05 NOTE — Patient Instructions (Signed)
Your physician recommends that you schedule a follow-up appointment in: January 2013

## 2011-04-05 NOTE — Assessment & Plan Note (Signed)
His device is working normally. Will recheck in several months. 

## 2011-04-05 NOTE — Assessment & Plan Note (Signed)
His symptoms are not due to angina. There is a pleuritic component. Overall however his symptoms are quite mild. They appear to be improving and I would recommend watchful waiting.

## 2011-04-13 ENCOUNTER — Ambulatory Visit (INDEPENDENT_AMBULATORY_CARE_PROVIDER_SITE_OTHER): Payer: Medicare Other | Admitting: *Deleted

## 2011-04-13 ENCOUNTER — Encounter: Payer: Self-pay | Admitting: Cardiology

## 2011-04-13 DIAGNOSIS — I4891 Unspecified atrial fibrillation: Secondary | ICD-10-CM

## 2011-04-13 DIAGNOSIS — G459 Transient cerebral ischemic attack, unspecified: Secondary | ICD-10-CM

## 2011-04-13 DIAGNOSIS — Z7901 Long term (current) use of anticoagulants: Secondary | ICD-10-CM

## 2011-04-13 LAB — POCT INR: INR: 2.4

## 2011-04-20 ENCOUNTER — Telehealth: Payer: Self-pay | Admitting: *Deleted

## 2011-04-20 MED ORDER — WARFARIN SODIUM 5 MG PO TABS: ORAL_TABLET | ORAL | Status: DC

## 2011-04-20 NOTE — Telephone Encounter (Signed)
..   Requested Prescriptions   Signed Prescriptions Disp Refills  . warfarin (COUMADIN) 5 MG tablet 90 tablet 3    Sig: Take 2 tablets daily except 3 tablets on M,W,F    Authorizing Provider: DE GENT, GUY    Ordering User: Lacie Scotts

## 2011-04-20 NOTE — Telephone Encounter (Signed)
[place] pt. 

## 2011-05-11 ENCOUNTER — Ambulatory Visit (INDEPENDENT_AMBULATORY_CARE_PROVIDER_SITE_OTHER): Payer: Medicare Other | Admitting: *Deleted

## 2011-05-11 DIAGNOSIS — G459 Transient cerebral ischemic attack, unspecified: Secondary | ICD-10-CM

## 2011-05-11 DIAGNOSIS — Z7901 Long term (current) use of anticoagulants: Secondary | ICD-10-CM

## 2011-05-11 DIAGNOSIS — I4891 Unspecified atrial fibrillation: Secondary | ICD-10-CM

## 2011-05-11 LAB — POCT INR: INR: 2.2

## 2011-06-03 ENCOUNTER — Encounter: Payer: Self-pay | Admitting: Internal Medicine

## 2011-06-03 ENCOUNTER — Telehealth: Payer: Self-pay | Admitting: *Deleted

## 2011-06-03 NOTE — Telephone Encounter (Signed)
Remote transmission shows rapid ventricular rates during A-fib.  Metoprolol increased to 25mg  bid per Dr. [name].   Patient advised and in agreement.

## 2011-06-08 ENCOUNTER — Ambulatory Visit (INDEPENDENT_AMBULATORY_CARE_PROVIDER_SITE_OTHER): Payer: Medicare Other | Admitting: *Deleted

## 2011-06-08 DIAGNOSIS — I4891 Unspecified atrial fibrillation: Secondary | ICD-10-CM

## 2011-06-08 DIAGNOSIS — Z7901 Long term (current) use of anticoagulants: Secondary | ICD-10-CM

## 2011-06-08 DIAGNOSIS — G459 Transient cerebral ischemic attack, unspecified: Secondary | ICD-10-CM

## 2011-07-06 ENCOUNTER — Ambulatory Visit (INDEPENDENT_AMBULATORY_CARE_PROVIDER_SITE_OTHER): Payer: Medicare Other | Admitting: *Deleted

## 2011-07-06 DIAGNOSIS — Z7901 Long term (current) use of anticoagulants: Secondary | ICD-10-CM

## 2011-07-06 DIAGNOSIS — I4891 Unspecified atrial fibrillation: Secondary | ICD-10-CM

## 2011-07-06 DIAGNOSIS — G459 Transient cerebral ischemic attack, unspecified: Secondary | ICD-10-CM

## 2011-08-03 ENCOUNTER — Ambulatory Visit (INDEPENDENT_AMBULATORY_CARE_PROVIDER_SITE_OTHER): Payer: Medicare Other | Admitting: *Deleted

## 2011-08-03 DIAGNOSIS — G459 Transient cerebral ischemic attack, unspecified: Secondary | ICD-10-CM

## 2011-08-03 DIAGNOSIS — I4891 Unspecified atrial fibrillation: Secondary | ICD-10-CM

## 2011-08-03 DIAGNOSIS — Z7901 Long term (current) use of anticoagulants: Secondary | ICD-10-CM

## 2011-08-03 LAB — POCT INR: INR: 2.6

## 2011-09-14 ENCOUNTER — Ambulatory Visit (INDEPENDENT_AMBULATORY_CARE_PROVIDER_SITE_OTHER): Payer: Medicare Other | Admitting: *Deleted

## 2011-09-14 DIAGNOSIS — G459 Transient cerebral ischemic attack, unspecified: Secondary | ICD-10-CM

## 2011-09-14 DIAGNOSIS — Z7901 Long term (current) use of anticoagulants: Secondary | ICD-10-CM

## 2011-09-14 DIAGNOSIS — I4891 Unspecified atrial fibrillation: Secondary | ICD-10-CM

## 2011-09-14 MED ORDER — WARFARIN SODIUM 5 MG PO TABS: ORAL_TABLET | ORAL | Status: DC

## 2011-09-17 ENCOUNTER — Encounter: Payer: Self-pay | Admitting: Internal Medicine

## 2011-09-23 ENCOUNTER — Other Ambulatory Visit: Payer: Self-pay | Admitting: Cardiology

## 2011-09-28 HISTORY — PX: COLONOSCOPY: SHX174

## 2011-10-04 ENCOUNTER — Encounter: Payer: Self-pay | Admitting: Internal Medicine

## 2011-10-06 ENCOUNTER — Ambulatory Visit (INDEPENDENT_AMBULATORY_CARE_PROVIDER_SITE_OTHER): Payer: Medicare Other | Admitting: Internal Medicine

## 2011-10-06 ENCOUNTER — Encounter: Payer: Self-pay | Admitting: Internal Medicine

## 2011-10-06 VITALS — BP 124/82 | HR 97 | Ht 70.0 in | Wt 183.4 lb

## 2011-10-06 DIAGNOSIS — I495 Sick sinus syndrome: Secondary | ICD-10-CM

## 2011-10-06 DIAGNOSIS — I4891 Unspecified atrial fibrillation: Secondary | ICD-10-CM

## 2011-10-06 DIAGNOSIS — Z950 Presence of cardiac pacemaker: Secondary | ICD-10-CM

## 2011-10-06 LAB — PACEMAKER DEVICE OBSERVATION
AL AMPLITUDE: 5 mv
BAMS-0001: 150 {beats}/min
BAMS-0003: 70 {beats}/min
DEVICE MODEL PM: 7196227
RV LEAD THRESHOLD: 1 V
VENTRICULAR PACING PM: 5.9

## 2011-10-06 MED ORDER — PROPAFENONE HCL 150 MG PO TABS: ORAL_TABLET | ORAL | Status: DC

## 2011-10-06 NOTE — Assessment & Plan Note (Signed)
His device is working normally. We'll plan to recheck in several months. 

## 2011-10-06 NOTE — Assessment & Plan Note (Signed)
The patient appears to be having more atrial fibrillation based on pacemaker interrogation. He appears to be symptomatic. He was intolerant of flecainide. I have recommended that we try low dose Rythmol. He will continue his other medical therapy

## 2011-10-06 NOTE — Progress Notes (Signed)
HPI Mr. Buenger turns today for followup. He is a 71 year old man with a history of paroxysmal atrial fibrillation and symptomatic bradycardia. He is status post permanent pacemaker insertion. He was initially treated with flecainide but was intolerant of this medication. Over the last year he continues to have occasional episodes of palpitations and chest discomfort. These are frequently associated with one another. He has had no syncope. Overall his symptoms are improved. He denies peripheral edema. He has no shortness of breath. Allergies  Allergen Reactions  . Sulfonamide Derivatives      Current Outpatient Prescriptions  Medication Sig Dispense Refill  . aspirin 81 MG tablet Take 81 mg by mouth daily.        . Calcium Citrate-Vitamin D (CITRACAL MAXIMUM PO) Take by mouth daily.        . Cyanocobalamin (VITAMIN B12 PO) Take by mouth daily.        . Glucosamine-Chondroitin 250-200 MG TABS Take 2 tablets by mouth.      . Magnesium Oxide (MAG-CAPS PO) Take by mouth daily.        . metoprolol succinate (TOPROL-XL) 25 MG 24 hr tablet Take 25 mg by mouth 2 (two) times daily.       . Misc Natural Products (PROGESTERONE EX) Apply topically.        . Multiple Vitamin (MULTIVITAMIN) capsule Take 1 capsule by mouth daily.        . Multiple Vitamins-Minerals (ZINC PO) Take by mouth daily.        . Probiotic Product (PROBIOTIC PO) Take by mouth daily.        . tadalafil (CIALIS) 5 MG tablet Take 5 mg by mouth daily as needed.      . warfarin (COUMADIN) 5 MG tablet Take 2 tablets daily except 3 tablets on M,W,F  90 tablet  3  . DISCONTD: warfarin (COUMADIN) 5 MG tablet TAKE 2 TABLETS DAILY EXCEPT 3 TABLETS ON MONDAY, WEDNESDAY, AND FRIDAY  90 tablet  2     Past Medical History  Diagnosis Date  . Essential hypertension, benign   . ERYTHROCYTOSIS   . AF (paroxysmal atrial fibrillation)   . SICK SINUS SYNDROME   . DIZZINESS   . Other malaise and fatigue   . Painful respiration   . NUMBNESS   .  CHEST PAIN UNSPECIFIED   . TIA   . Melanoma   . Depression   . Anxiety   . Visual loss     intermittent    ROS:   All systems reviewed and negative except as noted in the HPI.   Past Surgical History  Procedure Date  . Pacemaker insertion      Family History  Problem Relation Age of Onset  . Prostate cancer Father      History   Social History  . Marital Status: Married    Spouse Name: N/A    Number of Children: N/A  . Years of Education: N/A   Occupational History  . Not on file.   Social History Main Topics  . Smoking status: Former Games developer  . Smokeless tobacco: Never Used   Comment: quit in 1976  . Alcohol Use: No  . Drug Use: No  . Sexually Active: Not on file   Other Topics Concern  . Not on file   Social History Narrative  . No narrative on file     BP 124/82  Pulse 97  Ht 5\' 10"  (1.778 m)  Wt 83.19 kg (183 lb 6.4  oz)  BMI 26.32 kg/m2  Physical Exam:  Well appearing middle-aged man, NAD HEENT: Unremarkable Neck:  No JVD, no thyromegally Lungs:  Clear with no wheezes, rales, or rhonchi. HEART:  Regular rate rhythm, no murmurs, no rubs, no clicks Abd:  soft, positive bowel sounds, no organomegally, no rebound, no guarding Ext:  2 plus pulses, no edema, no cyanosis, no clubbing Skin:  No rashes no nodules Neuro:  CN II through XII intact, motor grossly intact  DEVICE  Normal device function.  See PaceArt for details.   Assess/Plan:

## 2011-10-06 NOTE — Patient Instructions (Addendum)
Your physician wants you to follow-up in: April 2013 You will receive a reminder letter in the mail two months in advance. If you don't receive a letter, please call our office to schedule the follow-up appointment.  Your physician has recommended you make the following change in your medication: 1) Start Rhthymol 150mg  three times daily

## 2011-10-26 ENCOUNTER — Ambulatory Visit (INDEPENDENT_AMBULATORY_CARE_PROVIDER_SITE_OTHER): Payer: Medicare Other | Admitting: *Deleted

## 2011-10-26 DIAGNOSIS — Z7901 Long term (current) use of anticoagulants: Secondary | ICD-10-CM

## 2011-10-26 DIAGNOSIS — G459 Transient cerebral ischemic attack, unspecified: Secondary | ICD-10-CM

## 2011-10-26 DIAGNOSIS — I4891 Unspecified atrial fibrillation: Secondary | ICD-10-CM

## 2011-11-08 ENCOUNTER — Other Ambulatory Visit: Payer: Self-pay | Admitting: *Deleted

## 2011-11-08 MED ORDER — METOPROLOL TARTRATE 25 MG PO TABS: 25.0000 mg | ORAL_TABLET | Freq: Two times a day (BID) | ORAL | Status: DC

## 2011-11-16 ENCOUNTER — Telehealth: Payer: Self-pay | Admitting: Internal Medicine

## 2011-11-16 ENCOUNTER — Ambulatory Visit (INDEPENDENT_AMBULATORY_CARE_PROVIDER_SITE_OTHER): Payer: Medicare Other | Admitting: *Deleted

## 2011-11-16 DIAGNOSIS — I4891 Unspecified atrial fibrillation: Secondary | ICD-10-CM

## 2011-11-16 DIAGNOSIS — Z7901 Long term (current) use of anticoagulants: Secondary | ICD-10-CM

## 2011-11-16 DIAGNOSIS — G459 Transient cerebral ischemic attack, unspecified: Secondary | ICD-10-CM

## 2011-11-16 LAB — POCT INR: INR: 3.1

## 2011-11-17 NOTE — Telephone Encounter (Signed)
Noted  

## 2011-12-07 ENCOUNTER — Ambulatory Visit (INDEPENDENT_AMBULATORY_CARE_PROVIDER_SITE_OTHER): Payer: Medicare Other | Admitting: *Deleted

## 2011-12-07 DIAGNOSIS — I4891 Unspecified atrial fibrillation: Secondary | ICD-10-CM

## 2011-12-07 DIAGNOSIS — G459 Transient cerebral ischemic attack, unspecified: Secondary | ICD-10-CM

## 2011-12-07 DIAGNOSIS — Z7901 Long term (current) use of anticoagulants: Secondary | ICD-10-CM

## 2011-12-07 LAB — POCT INR: INR: 2.7

## 2012-01-04 ENCOUNTER — Ambulatory Visit (INDEPENDENT_AMBULATORY_CARE_PROVIDER_SITE_OTHER): Payer: Medicare Other | Admitting: *Deleted

## 2012-01-04 DIAGNOSIS — Z7901 Long term (current) use of anticoagulants: Secondary | ICD-10-CM

## 2012-01-04 DIAGNOSIS — G459 Transient cerebral ischemic attack, unspecified: Secondary | ICD-10-CM

## 2012-01-04 DIAGNOSIS — I4891 Unspecified atrial fibrillation: Secondary | ICD-10-CM

## 2012-01-06 ENCOUNTER — Encounter: Payer: Medicare Other | Admitting: *Deleted

## 2012-01-18 ENCOUNTER — Encounter: Payer: Self-pay | Admitting: Internal Medicine

## 2012-01-18 ENCOUNTER — Ambulatory Visit (INDEPENDENT_AMBULATORY_CARE_PROVIDER_SITE_OTHER): Payer: Medicare Other | Admitting: Internal Medicine

## 2012-01-18 VITALS — BP 120/88 | HR 74 | Ht 70.0 in | Wt 185.4 lb

## 2012-01-18 DIAGNOSIS — Z950 Presence of cardiac pacemaker: Secondary | ICD-10-CM

## 2012-01-18 DIAGNOSIS — I4891 Unspecified atrial fibrillation: Secondary | ICD-10-CM

## 2012-01-18 DIAGNOSIS — I495 Sick sinus syndrome: Secondary | ICD-10-CM

## 2012-01-18 LAB — PACEMAKER DEVICE OBSERVATION
AL THRESHOLD: 1 V
ATRIAL PACING PM: 88
DEVICE MODEL PM: 7196227
RV LEAD AMPLITUDE: 12 mv
RV LEAD IMPEDENCE PM: 450 Ohm
RV LEAD THRESHOLD: 1.125 V

## 2012-01-18 MED ORDER — METOPROLOL TARTRATE 25 MG PO TABS: 25.0000 mg | ORAL_TABLET | Freq: Two times a day (BID) | ORAL | Status: DC

## 2012-01-18 MED ORDER — PROPAFENONE HCL ER 225 MG PO CP12: 225.0000 mg | ORAL_CAPSULE | Freq: Two times a day (BID) | ORAL | Status: DC

## 2012-01-18 NOTE — Progress Notes (Signed)
HPI Joshua [name] returns today for followup. He is a 71 year old man with a history of paroxysmal atrial fibrillation and symptomatic bradycardia, status post permanent pacemaker insertion. Several months ago, he was placed on Rythmol. Initially, he did very well with no symptomatic arrhythmias. Over the last month, he notes episodes intermittently. It is associated with shortness of breath and dizziness. No syncope. He does admit to some noncompliance as he was originally placed on the 3 times a day preparation. Allergies  Allergen Reactions  . Contrast Media (Iodinated Diagnostic Agents)   . Sulfonamide Derivatives      Current Outpatient Prescriptions  Medication Sig Dispense Refill  . aspirin 81 MG tablet Take 81 mg by mouth daily.        . Glucosamine-Chondroitin 250-200 MG TABS Take 2 tablets by mouth.      . Magnesium Oxide (MAG-CAPS PO) Take by mouth daily.        . metoprolol succinate (TOPROL-XL) 25 MG 24 hr tablet Take 25 mg by mouth 2 (two) times daily.       . metoprolol tartrate (LOPRESSOR) 25 MG tablet Take 1 tablet (25 mg total) by mouth 2 (two) times daily.  60 tablet  2  . Multiple Vitamin (MULTIVITAMIN) capsule Take 1 capsule by mouth daily.        . Multiple Vitamins-Minerals (ZINC PO) Take by mouth daily.        . Probiotic Product (PROBIOTIC PO) Take by mouth daily.        . propafenone (RYTHMOL) 150 MG tablet Take one tablet by mouth 3 times daily  90 tablet  11  . tadalafil (CIALIS) 5 MG tablet Take 5 mg by mouth daily as needed.      . testosterone cypionate (DEPOTESTOTERONE CYPIONATE) 200 MG/ML injection Inject 100 mg into the muscle every 14 (fourteen) days.      Marland Kitchen warfarin (COUMADIN) 5 MG tablet Take 2 tablets daily except 3 tablets on M,W,F  90 tablet  3     Past Medical History  Diagnosis Date  . Essential hypertension, benign   . ERYTHROCYTOSIS   . AF (paroxysmal atrial fibrillation)   . SICK SINUS SYNDROME   . DIZZINESS   . Other malaise and fatigue   .  Painful respiration   . NUMBNESS   . CHEST PAIN UNSPECIFIED   . TIA   . Melanoma   . Depression   . Anxiety   . Visual loss     intermittent    ROS:   All systems reviewed and negative except as noted in the HPI.   Past Surgical History  Procedure Date  . Pacemaker insertion      Family History  Problem Relation Age of Onset  . Prostate cancer Father      History   Social History  . Marital Status: Married    Spouse Name: N/A    Number of Children: N/A  . Years of Education: N/A   Occupational History  . Not on file.   Social History Main Topics  . Smoking status: Former Games developer  . Smokeless tobacco: Never Used   Comment: quit in 1976  . Alcohol Use: No  . Drug Use: No  . Sexually Active: Not on file   Other Topics Concern  . Not on file   Social History Narrative  . No narrative on file     BP 120/88  Pulse 74  Ht 5\' 10"  (1.778 m)  Wt 185 lb 6.4 oz (84.097  kg)  BMI 26.60 kg/m2  Physical Exam:  Well appearing middle-aged man, NAD HEENT: Unremarkable Neck:  No JVD, no thyromegally Lungs:  Clear with no wheezes, rales, or rhonchi. HEART:  Regular rate rhythm, no murmurs, no rubs, no clicks Abd:  soft, positive bowel sounds, no organomegally, no rebound, no guarding Ext:  2 plus pulses, no edema, no cyanosis, no clubbing Skin:  No rashes no nodules Neuro:  CN II through XII intact, motor grossly intact  DEVICE  Normal device function.  See PaceArt for details.   Assess/Plan:

## 2012-01-18 NOTE — Patient Instructions (Addendum)
Your physician recommends that you schedule a follow-up appointment in: 3 months with Dr [name]  Will change Rythmol to 225mg  twice daily

## 2012-01-18 NOTE — Assessment & Plan Note (Signed)
His device is working normally. We'll plan to recheck in several months. 

## 2012-01-18 NOTE — Assessment & Plan Note (Signed)
He is maintaining sinus rhythm greater than 99% of the time since beginning Rythmol. Unfortunately, he is highly symptomatic when he is in atrial fib. We discussed the treatment options in detail today. I recommended that we change him from 3 times a day Rythmol in the short acting preparation, to long-acting Rythmol. I will see him back in 3 months. We will uptitrate his medical therapy as needed at that time. At this point, I do not think he is a candidate for catheter ablation due to the infrequent amount of atrial fibrillation he is experiencing.

## 2012-01-19 ENCOUNTER — Telehealth: Payer: Self-pay

## 2012-01-19 NOTE — Telephone Encounter (Signed)
Patient pharmacy called said his insurance will not cover increased dose of rythmol. Called and left message for patient to return call to verify which insurance for prescription coverage so I can call to get a form faxed that will cover medication.

## 2012-01-20 ENCOUNTER — Telehealth: Payer: Self-pay

## 2012-01-20 DIAGNOSIS — I4891 Unspecified atrial fibrillation: Secondary | ICD-10-CM

## 2012-01-20 MED ORDER — PROPAFENONE HCL 150 MG PO TABS: 150.0000 mg | ORAL_TABLET | Freq: Three times a day (TID) | ORAL | Status: DC

## 2012-01-20 NOTE — Telephone Encounter (Signed)
[name] called today stating that he wanted to go back to generic rythmol 150 mg TID due to cost. Pharmacy wont fill the prescription for rythmol 225 mg Bid because patients insurance will end up costing the pharmacy money.

## 2012-01-20 NOTE — Telephone Encounter (Signed)
Addended by: Dennis Bast F on: 01/20/2012 04:05 PM   Modules accepted: Orders

## 2012-02-08 ENCOUNTER — Ambulatory Visit (INDEPENDENT_AMBULATORY_CARE_PROVIDER_SITE_OTHER): Payer: Medicare Other | Admitting: *Deleted

## 2012-02-08 DIAGNOSIS — Z7901 Long term (current) use of anticoagulants: Secondary | ICD-10-CM

## 2012-02-08 DIAGNOSIS — I4891 Unspecified atrial fibrillation: Secondary | ICD-10-CM

## 2012-02-08 DIAGNOSIS — G459 Transient cerebral ischemic attack, unspecified: Secondary | ICD-10-CM

## 2012-02-08 LAB — POCT INR: INR: 2.6

## 2012-02-25 ENCOUNTER — Other Ambulatory Visit: Payer: Self-pay | Admitting: Cardiology

## 2012-03-28 ENCOUNTER — Ambulatory Visit (INDEPENDENT_AMBULATORY_CARE_PROVIDER_SITE_OTHER): Payer: Medicare Other | Admitting: *Deleted

## 2012-03-28 DIAGNOSIS — I4891 Unspecified atrial fibrillation: Secondary | ICD-10-CM

## 2012-03-28 DIAGNOSIS — Z7901 Long term (current) use of anticoagulants: Secondary | ICD-10-CM

## 2012-03-28 DIAGNOSIS — G459 Transient cerebral ischemic attack, unspecified: Secondary | ICD-10-CM

## 2012-03-28 MED ORDER — WARFARIN SODIUM 5 MG PO TABS: 5.0000 mg | ORAL_TABLET | ORAL | Status: DC

## 2012-04-05 ENCOUNTER — Encounter: Payer: Medicare Other | Admitting: Internal Medicine

## 2012-04-25 ENCOUNTER — Encounter: Payer: Self-pay | Admitting: Internal Medicine

## 2012-04-25 ENCOUNTER — Ambulatory Visit (INDEPENDENT_AMBULATORY_CARE_PROVIDER_SITE_OTHER): Payer: Medicare Other | Admitting: Internal Medicine

## 2012-04-25 VITALS — BP 110/80 | HR 66 | Ht 70.0 in | Wt 194.8 lb

## 2012-04-25 DIAGNOSIS — I4891 Unspecified atrial fibrillation: Secondary | ICD-10-CM

## 2012-04-25 DIAGNOSIS — Z950 Presence of cardiac pacemaker: Secondary | ICD-10-CM

## 2012-04-25 DIAGNOSIS — I495 Sick sinus syndrome: Secondary | ICD-10-CM

## 2012-04-25 DIAGNOSIS — I10 Essential (primary) hypertension: Secondary | ICD-10-CM

## 2012-04-25 LAB — PACEMAKER DEVICE OBSERVATION
AL THRESHOLD: 0.875 V
ATRIAL PACING PM: 90
BAMS-0001: 150 {beats}/min
BAMS-0003: 70 {beats}/min
DEVICE MODEL PM: 7196227
RV LEAD IMPEDENCE PM: 412.5 Ohm
RV LEAD THRESHOLD: 1 V
VENTRICULAR PACING PM: 12

## 2012-04-25 NOTE — Assessment & Plan Note (Signed)
His device is working normally. Will recheck in several months. 

## 2012-04-25 NOTE — Progress Notes (Signed)
HPI [name] returns today for followup. He is a pleasant 71 yo man with a h/o PAF, symptomatic tachy-brady syndrome, s/p PPM. He denies chest pain or sob. He has minimal palpitations. He notes that he occaisionally missed his rhythmol. He denies syncope or peripheral edema. Allergies  Allergen Reactions  . Contrast Media (Iodinated Diagnostic Agents)   . Sulfonamide Derivatives      Current Outpatient Prescriptions  Medication Sig Dispense Refill  . metoprolol tartrate (LOPRESSOR) 25 MG tablet Take 1 tablet (25 mg total) by mouth 2 (two) times daily.  60 tablet  3  . pantoprazole (PROTONIX) 40 MG tablet Take 40 mg by mouth 2 (two) times daily.       . pramipexole (MIRAPEX) 0.25 MG tablet Take 0.25 mg by mouth at bedtime.       . propafenone (RYTHMOL) 150 MG tablet Take 1 tablet (150 mg total) by mouth every 8 (eight) hours.  90 tablet  11  . tadalafil (CIALIS) 5 MG tablet Take 5 mg by mouth daily as needed.      . testosterone cypionate (DEPOTESTOTERONE CYPIONATE) 200 MG/ML injection Inject 100 mg into the muscle every 14 (fourteen) days.      Marland Kitchen warfarin (COUMADIN) 5 MG tablet Take 1 tablet (5 mg total) by mouth as directed. TAKE 2 TABLETS BY MOUTH EXCEPT 3 TABLETS ON MONDAY, WEDNESDAY AND FRIDAY  90 tablet  3     Past Medical History  Diagnosis Date  . Essential hypertension, benign   . ERYTHROCYTOSIS   . AF (paroxysmal atrial fibrillation)   . SICK SINUS SYNDROME   . DIZZINESS   . Other malaise and fatigue   . Painful respiration   . NUMBNESS   . CHEST PAIN UNSPECIFIED   . TIA   . Melanoma   . Depression   . Anxiety   . Visual loss     intermittent    ROS:   All systems reviewed and negative except as noted in the HPI.   Past Surgical History  Procedure Date  . Pacemaker insertion      Family History  Problem Relation Age of Onset  . Prostate cancer Father      History   Social History  . Marital Status: Married    Spouse Name: N/A    Number of  Children: N/A  . Years of Education: N/A   Occupational History  . Not on file.   Social History Main Topics  . Smoking status: Former Games developer  . Smokeless tobacco: Never Used   Comment: quit in 1976  . Alcohol Use: No  . Drug Use: No  . Sexually Active: Not on file   Other Topics Concern  . Not on file   Social History Narrative  . No narrative on file     BP 110/80  Pulse 66  Ht 5\' 10"  (1.778 m)  Wt 194 lb 12.8 oz (88.361 kg)  BMI 27.95 kg/m2  Physical Exam:  Well appearing middle aged man, NAD HEENT: Unremarkable Neck:  No JVD, no thyromegally Lungs:  Clear with no wheezes, rales, or rhonchi. HEART:  Regular rate rhythm, no murmurs, no rubs, no clicks Abd:  soft, positive bowel sounds, no organomegally, no rebound, no guarding Ext:  2 plus pulses, no edema, no cyanosis, no clubbing Skin:  No rashes no nodules Neuro:  CN II through XII intact, motor grossly intact  EKG NSR with atrial pacing DEVICE  Normal device(St. Jude DDD PPM) function.  See PaceArt  for details.   Assess/Plan:

## 2012-04-25 NOTE — Patient Instructions (Signed)
Your physician wants you to follow-up in: 12 months with Dr Taylor You will receive a reminder letter in the mail two months in advance. If you don't receive a letter, please call our office to schedule the follow-up appointment.   Remote monitoring is used to monitor your Pacemaker of ICD from home. This monitoring reduces the number of office visits required to check your device to one time per year. It allows us to keep an eye on the functioning of your device to ensure it is working properly. You are scheduled for a device check from home on 07/31/12. You may send your transmission at any time that day. If you have a wireless device, the transmission will be sent automatically. After your physician reviews your transmission, you will receive a postcard with your next transmission date.   

## 2012-04-25 NOTE — Assessment & Plan Note (Signed)
His blood pressure is well controlled. Will continue his current meds. He will maintain a low sodium diet.

## 2012-04-25 NOTE — Assessment & Plan Note (Signed)
He appears to be maintaining NSR. He will continue his current medical therapy.

## 2012-05-09 ENCOUNTER — Ambulatory Visit (INDEPENDENT_AMBULATORY_CARE_PROVIDER_SITE_OTHER): Payer: Medicare Other | Admitting: *Deleted

## 2012-05-09 DIAGNOSIS — G459 Transient cerebral ischemic attack, unspecified: Secondary | ICD-10-CM

## 2012-05-09 DIAGNOSIS — Z7901 Long term (current) use of anticoagulants: Secondary | ICD-10-CM

## 2012-05-09 DIAGNOSIS — I4891 Unspecified atrial fibrillation: Secondary | ICD-10-CM

## 2012-05-09 LAB — POCT INR: INR: 4.8

## 2012-05-23 ENCOUNTER — Ambulatory Visit (INDEPENDENT_AMBULATORY_CARE_PROVIDER_SITE_OTHER): Payer: Medicare Other | Admitting: *Deleted

## 2012-05-23 DIAGNOSIS — I4891 Unspecified atrial fibrillation: Secondary | ICD-10-CM

## 2012-05-23 DIAGNOSIS — G459 Transient cerebral ischemic attack, unspecified: Secondary | ICD-10-CM

## 2012-05-23 DIAGNOSIS — Z7901 Long term (current) use of anticoagulants: Secondary | ICD-10-CM

## 2012-05-23 LAB — POCT INR: INR: 4.3

## 2012-06-06 ENCOUNTER — Ambulatory Visit (INDEPENDENT_AMBULATORY_CARE_PROVIDER_SITE_OTHER): Payer: Medicare Other | Admitting: *Deleted

## 2012-06-06 DIAGNOSIS — I4891 Unspecified atrial fibrillation: Secondary | ICD-10-CM

## 2012-06-06 DIAGNOSIS — G459 Transient cerebral ischemic attack, unspecified: Secondary | ICD-10-CM

## 2012-06-06 DIAGNOSIS — Z7901 Long term (current) use of anticoagulants: Secondary | ICD-10-CM

## 2012-06-27 ENCOUNTER — Ambulatory Visit (INDEPENDENT_AMBULATORY_CARE_PROVIDER_SITE_OTHER): Payer: Medicare Other | Admitting: *Deleted

## 2012-06-27 DIAGNOSIS — G459 Transient cerebral ischemic attack, unspecified: Secondary | ICD-10-CM

## 2012-06-27 DIAGNOSIS — Z7901 Long term (current) use of anticoagulants: Secondary | ICD-10-CM

## 2012-06-27 DIAGNOSIS — I4891 Unspecified atrial fibrillation: Secondary | ICD-10-CM

## 2012-07-06 ENCOUNTER — Other Ambulatory Visit: Payer: Self-pay | Admitting: Internal Medicine

## 2012-07-07 ENCOUNTER — Other Ambulatory Visit: Payer: Self-pay | Admitting: *Deleted

## 2012-07-07 NOTE — Telephone Encounter (Signed)
Opened in Error.

## 2012-07-18 ENCOUNTER — Ambulatory Visit (INDEPENDENT_AMBULATORY_CARE_PROVIDER_SITE_OTHER): Payer: Medicare Other | Admitting: *Deleted

## 2012-07-18 DIAGNOSIS — Z7901 Long term (current) use of anticoagulants: Secondary | ICD-10-CM

## 2012-07-18 DIAGNOSIS — I4891 Unspecified atrial fibrillation: Secondary | ICD-10-CM

## 2012-07-18 DIAGNOSIS — G459 Transient cerebral ischemic attack, unspecified: Secondary | ICD-10-CM

## 2012-07-18 LAB — POCT INR: INR: 2.2

## 2012-07-31 ENCOUNTER — Encounter: Payer: Self-pay | Admitting: Internal Medicine

## 2012-07-31 ENCOUNTER — Ambulatory Visit (INDEPENDENT_AMBULATORY_CARE_PROVIDER_SITE_OTHER): Payer: Medicare Other | Admitting: *Deleted

## 2012-07-31 DIAGNOSIS — I4891 Unspecified atrial fibrillation: Secondary | ICD-10-CM

## 2012-07-31 DIAGNOSIS — Z950 Presence of cardiac pacemaker: Secondary | ICD-10-CM

## 2012-08-01 ENCOUNTER — Encounter: Payer: Self-pay | Admitting: *Deleted

## 2012-08-01 LAB — REMOTE PACEMAKER DEVICE
AL AMPLITUDE: 3.6 mv
ATRIAL PACING PM: 94
BAMS-0001: 150 {beats}/min
BAMS-0003: 70 {beats}/min
BATTERY VOLTAGE: 2.95 V
RV LEAD THRESHOLD: 1.125 V
VENTRICULAR PACING PM: 13

## 2012-08-15 ENCOUNTER — Ambulatory Visit (INDEPENDENT_AMBULATORY_CARE_PROVIDER_SITE_OTHER): Payer: Medicare Other | Admitting: *Deleted

## 2012-08-15 ENCOUNTER — Encounter: Payer: Self-pay | Admitting: *Deleted

## 2012-08-15 DIAGNOSIS — G459 Transient cerebral ischemic attack, unspecified: Secondary | ICD-10-CM

## 2012-08-15 DIAGNOSIS — Z7901 Long term (current) use of anticoagulants: Secondary | ICD-10-CM

## 2012-08-15 DIAGNOSIS — I4891 Unspecified atrial fibrillation: Secondary | ICD-10-CM

## 2012-08-15 LAB — POCT INR: INR: 2.5

## 2012-09-26 ENCOUNTER — Ambulatory Visit (INDEPENDENT_AMBULATORY_CARE_PROVIDER_SITE_OTHER): Payer: Medicare Other | Admitting: *Deleted

## 2012-09-26 DIAGNOSIS — I4891 Unspecified atrial fibrillation: Secondary | ICD-10-CM

## 2012-09-26 DIAGNOSIS — Z7901 Long term (current) use of anticoagulants: Secondary | ICD-10-CM

## 2012-09-26 DIAGNOSIS — G459 Transient cerebral ischemic attack, unspecified: Secondary | ICD-10-CM

## 2012-09-26 LAB — POCT INR: INR: 2.4

## 2012-11-06 ENCOUNTER — Other Ambulatory Visit: Payer: Self-pay | Admitting: Internal Medicine

## 2012-11-06 ENCOUNTER — Ambulatory Visit (INDEPENDENT_AMBULATORY_CARE_PROVIDER_SITE_OTHER): Payer: Medicare HMO | Admitting: *Deleted

## 2012-11-06 ENCOUNTER — Encounter: Payer: Self-pay | Admitting: Internal Medicine

## 2012-11-06 DIAGNOSIS — Z950 Presence of cardiac pacemaker: Secondary | ICD-10-CM

## 2012-11-06 DIAGNOSIS — I495 Sick sinus syndrome: Secondary | ICD-10-CM

## 2012-11-07 ENCOUNTER — Ambulatory Visit (INDEPENDENT_AMBULATORY_CARE_PROVIDER_SITE_OTHER): Payer: Medicare HMO | Admitting: *Deleted

## 2012-11-07 DIAGNOSIS — G459 Transient cerebral ischemic attack, unspecified: Secondary | ICD-10-CM

## 2012-11-07 DIAGNOSIS — I4891 Unspecified atrial fibrillation: Secondary | ICD-10-CM

## 2012-11-07 DIAGNOSIS — Z7901 Long term (current) use of anticoagulants: Secondary | ICD-10-CM

## 2012-11-10 ENCOUNTER — Other Ambulatory Visit: Payer: Self-pay | Admitting: Internal Medicine

## 2012-11-11 LAB — REMOTE PACEMAKER DEVICE
AL IMPEDENCE PM: 400 Ohm
AL THRESHOLD: 0.75 V
DEVICE MODEL PM: 7196227
RV LEAD AMPLITUDE: 12 mv
RV LEAD IMPEDENCE PM: 450 Ohm

## 2012-11-29 ENCOUNTER — Encounter: Payer: Self-pay | Admitting: *Deleted

## 2012-12-19 ENCOUNTER — Ambulatory Visit (INDEPENDENT_AMBULATORY_CARE_PROVIDER_SITE_OTHER): Payer: Medicare HMO | Admitting: *Deleted

## 2012-12-19 DIAGNOSIS — Z7901 Long term (current) use of anticoagulants: Secondary | ICD-10-CM

## 2012-12-19 DIAGNOSIS — I4891 Unspecified atrial fibrillation: Secondary | ICD-10-CM

## 2012-12-19 DIAGNOSIS — G459 Transient cerebral ischemic attack, unspecified: Secondary | ICD-10-CM

## 2013-01-30 ENCOUNTER — Ambulatory Visit (INDEPENDENT_AMBULATORY_CARE_PROVIDER_SITE_OTHER): Payer: Medicare HMO | Admitting: *Deleted

## 2013-01-30 DIAGNOSIS — Z7901 Long term (current) use of anticoagulants: Secondary | ICD-10-CM

## 2013-01-30 DIAGNOSIS — I4891 Unspecified atrial fibrillation: Secondary | ICD-10-CM

## 2013-01-30 DIAGNOSIS — G459 Transient cerebral ischemic attack, unspecified: Secondary | ICD-10-CM

## 2013-02-05 ENCOUNTER — Other Ambulatory Visit: Payer: Self-pay | Admitting: Internal Medicine

## 2013-02-05 ENCOUNTER — Encounter: Payer: Self-pay | Admitting: Internal Medicine

## 2013-02-05 ENCOUNTER — Ambulatory Visit (INDEPENDENT_AMBULATORY_CARE_PROVIDER_SITE_OTHER): Payer: Medicare HMO | Admitting: *Deleted

## 2013-02-05 DIAGNOSIS — Z950 Presence of cardiac pacemaker: Secondary | ICD-10-CM

## 2013-02-05 DIAGNOSIS — I4891 Unspecified atrial fibrillation: Secondary | ICD-10-CM

## 2013-02-05 LAB — REMOTE PACEMAKER DEVICE
AL THRESHOLD: 0.875 V
ATRIAL PACING PM: 96
BAMS-0001: 150 {beats}/min
BAMS-0003: 70 {beats}/min
DEVICE MODEL PM: 7196227
RV LEAD THRESHOLD: 1.125 V

## 2013-02-09 ENCOUNTER — Other Ambulatory Visit: Payer: Self-pay | Admitting: Internal Medicine

## 2013-02-15 ENCOUNTER — Encounter: Payer: Self-pay | Admitting: *Deleted

## 2013-03-07 ENCOUNTER — Telehealth: Payer: Self-pay | Admitting: *Deleted

## 2013-03-07 NOTE — Telephone Encounter (Signed)
Just starting taking DOXYCYCL HYC 100MG  for deer tick that was attached to him

## 2013-03-08 NOTE — Telephone Encounter (Signed)
Spoke with pt.  Explained doxycycline may increase INR but usually not significantly.  He is already scheduled for follow-up INR on 6/17 so will keep that appt and not change anything at this time.

## 2013-03-13 ENCOUNTER — Ambulatory Visit (INDEPENDENT_AMBULATORY_CARE_PROVIDER_SITE_OTHER): Payer: Medicare HMO | Admitting: *Deleted

## 2013-03-13 DIAGNOSIS — Z7901 Long term (current) use of anticoagulants: Secondary | ICD-10-CM

## 2013-03-13 DIAGNOSIS — G459 Transient cerebral ischemic attack, unspecified: Secondary | ICD-10-CM

## 2013-03-13 DIAGNOSIS — I4891 Unspecified atrial fibrillation: Secondary | ICD-10-CM

## 2013-03-27 ENCOUNTER — Ambulatory Visit (INDEPENDENT_AMBULATORY_CARE_PROVIDER_SITE_OTHER): Payer: Medicare HMO | Admitting: *Deleted

## 2013-03-27 DIAGNOSIS — G459 Transient cerebral ischemic attack, unspecified: Secondary | ICD-10-CM

## 2013-03-27 DIAGNOSIS — I4891 Unspecified atrial fibrillation: Secondary | ICD-10-CM

## 2013-03-27 DIAGNOSIS — Z7901 Long term (current) use of anticoagulants: Secondary | ICD-10-CM

## 2013-04-02 ENCOUNTER — Other Ambulatory Visit: Payer: Self-pay | Admitting: Internal Medicine

## 2013-04-24 ENCOUNTER — Ambulatory Visit (INDEPENDENT_AMBULATORY_CARE_PROVIDER_SITE_OTHER): Payer: Medicare HMO | Admitting: *Deleted

## 2013-04-24 DIAGNOSIS — I4891 Unspecified atrial fibrillation: Secondary | ICD-10-CM

## 2013-04-24 DIAGNOSIS — G459 Transient cerebral ischemic attack, unspecified: Secondary | ICD-10-CM

## 2013-04-24 DIAGNOSIS — Z7901 Long term (current) use of anticoagulants: Secondary | ICD-10-CM

## 2013-04-27 ENCOUNTER — Ambulatory Visit (INDEPENDENT_AMBULATORY_CARE_PROVIDER_SITE_OTHER): Payer: Medicare HMO | Admitting: Internal Medicine

## 2013-04-27 ENCOUNTER — Encounter: Payer: Self-pay | Admitting: Internal Medicine

## 2013-04-27 VITALS — BP 130/86 | HR 109 | Ht 70.0 in | Wt 194.4 lb

## 2013-04-27 DIAGNOSIS — I4891 Unspecified atrial fibrillation: Secondary | ICD-10-CM

## 2013-04-27 DIAGNOSIS — I495 Sick sinus syndrome: Secondary | ICD-10-CM

## 2013-04-27 DIAGNOSIS — Z950 Presence of cardiac pacemaker: Secondary | ICD-10-CM

## 2013-04-27 LAB — PACEMAKER DEVICE OBSERVATION
AL AMPLITUDE: 2.3 mv
BAMS-0001: 150 {beats}/min
DEVICE MODEL PM: 7196227
RV LEAD AMPLITUDE: 12 mv

## 2013-04-27 NOTE — Assessment & Plan Note (Signed)
His ventricular pacing lead demonstrates a small amount of noise. He is not pacemaker dependent. He will undergo watchful waiting.

## 2013-04-27 NOTE — Progress Notes (Signed)
HPI Mr. Avakian returns today for followup. He is a 72 yo man with a history of symptomatic bradycardia, status post permanent pacemaker insertion. The patient has a history of paroxysmal atrial fibrillation which has been well-controlled with Rythmol. He has rare palpitations. Minimal chest discomfort. He denies peripheral edema or syncope. No shortness of breath. Allergies  Allergen Reactions  . Contrast Media (Iodinated Diagnostic Agents)   . Sulfonamide Derivatives      Current Outpatient Prescriptions  Medication Sig Dispense Refill  . gabapentin (NEURONTIN) 300 MG capsule Take 300 mg by mouth 3 (three) times daily.       . metoprolol tartrate (LOPRESSOR) 25 MG tablet TAKE 1 TABLET BY MOUTH TWICE DAILY  60 tablet  3  . propafenone (RYTHMOL) 150 MG tablet TAKE 1 TABLET BY MOUTH EVERY 8 HOURS  90 tablet  6  . tadalafil (CIALIS) 5 MG tablet Take 5 mg by mouth daily as needed.      . testosterone cypionate (DEPOTESTOTERONE CYPIONATE) 200 MG/ML injection Inject 100 mg into the muscle every 14 (fourteen) days.      Marland Kitchen warfarin (COUMADIN) 5 MG tablet TAKE 1 TABLET BY MOUTH AS DIRECTED BY ANTICOAGULATION CLINIC  60 tablet  3   No current facility-administered medications for this visit.     Past Medical History  Diagnosis Date  . Essential hypertension, benign   . ERYTHROCYTOSIS   . AF (paroxysmal atrial fibrillation)   . SICK SINUS SYNDROME   . DIZZINESS   . Other malaise and fatigue   . Painful respiration   . NUMBNESS   . CHEST PAIN UNSPECIFIED   . TIA   . Melanoma   . Depression   . Anxiety   . Visual loss     intermittent    ROS:   All systems reviewed and negative except as noted in the HPI.   Past Surgical History  Procedure Laterality Date  . Pacemaker insertion       Family History  Problem Relation Age of Onset  . Prostate cancer Father      History   Social History  . Marital Status: Married    Spouse Name: N/A    Number of Children: N/A  . Years  of Education: N/A   Occupational History  . Not on file.   Social History Main Topics  . Smoking status: Former Games developer  . Smokeless tobacco: Never Used     Comment: quit in 1976  . Alcohol Use: No  . Drug Use: No  . Sexually Active: Not on file   Other Topics Concern  . Not on file   Social History Narrative  . No narrative on file     BP 130/86  Pulse 109  Ht 5\' 10"  (1.778 m)  Wt 194 lb 6.4 oz (88.179 kg)  BMI 27.89 kg/m2  Physical Exam:  Well appearing middle-aged man, NAD HEENT: Unremarkable Neck: 7 cm JVD, no thyromegally Back:  No CVA tenderness Lungs:  Clear with no wheezes, rales, or rhonchi. HEART:  Regular rate rhythm, no murmurs, no rubs, no clicks Abd:  soft, positive bowel sounds, no organomegally, no rebound, no guarding Ext:  2 plus pulses, no edema, no cyanosis, no clubbing Skin:  No rashes no nodules Neuro:  CN II through XII intact, motor grossly intact   DEVICE  Normal device function except for minimal noises on the ventricular pacing lead..  See PaceArt for details.   Assess/Plan:

## 2013-04-27 NOTE — Assessment & Plan Note (Signed)
The patient is maintaining sinus rhythm very nicely. He will continue his current antiarrhythmic drug Rythmol.

## 2013-04-27 NOTE — Patient Instructions (Addendum)
Your physician wants you to follow-up in: 12 months with Dr Court Joy will receive a reminder letter in the mail two months in advance. If you don't receive a letter, please call our office to schedule the follow-up appointment.   Remote monitoring is used to monitor your Pacemaker of ICD from home. This monitoring reduces the number of office visits required to check your device to one time per year. It allows [place] to keep an eye on the functioning of your device to ensure it is working properly. You are scheduled for a device check from home on 07/30/13. You may send your transmission at any time that day. If you have a wireless device, the transmission will be sent automatically. After your physician reviews your transmission, you will receive a postcard with your next transmission date.

## 2013-05-15 ENCOUNTER — Ambulatory Visit (INDEPENDENT_AMBULATORY_CARE_PROVIDER_SITE_OTHER): Payer: Medicare HMO | Admitting: *Deleted

## 2013-05-15 DIAGNOSIS — G459 Transient cerebral ischemic attack, unspecified: Secondary | ICD-10-CM

## 2013-05-15 DIAGNOSIS — Z7901 Long term (current) use of anticoagulants: Secondary | ICD-10-CM

## 2013-05-15 DIAGNOSIS — I4891 Unspecified atrial fibrillation: Secondary | ICD-10-CM

## 2013-05-30 ENCOUNTER — Other Ambulatory Visit: Payer: Self-pay | Admitting: Internal Medicine

## 2013-06-05 ENCOUNTER — Ambulatory Visit (INDEPENDENT_AMBULATORY_CARE_PROVIDER_SITE_OTHER): Payer: Medicare HMO | Admitting: *Deleted

## 2013-06-05 DIAGNOSIS — G459 Transient cerebral ischemic attack, unspecified: Secondary | ICD-10-CM

## 2013-06-05 DIAGNOSIS — Z7901 Long term (current) use of anticoagulants: Secondary | ICD-10-CM

## 2013-06-05 DIAGNOSIS — I4891 Unspecified atrial fibrillation: Secondary | ICD-10-CM

## 2013-06-26 ENCOUNTER — Ambulatory Visit (INDEPENDENT_AMBULATORY_CARE_PROVIDER_SITE_OTHER): Payer: Medicare HMO | Admitting: *Deleted

## 2013-06-26 DIAGNOSIS — I4891 Unspecified atrial fibrillation: Secondary | ICD-10-CM

## 2013-06-26 DIAGNOSIS — G459 Transient cerebral ischemic attack, unspecified: Secondary | ICD-10-CM

## 2013-06-26 DIAGNOSIS — Z7901 Long term (current) use of anticoagulants: Secondary | ICD-10-CM

## 2013-07-24 ENCOUNTER — Ambulatory Visit (INDEPENDENT_AMBULATORY_CARE_PROVIDER_SITE_OTHER): Payer: Medicare HMO | Admitting: *Deleted

## 2013-07-24 DIAGNOSIS — Z7901 Long term (current) use of anticoagulants: Secondary | ICD-10-CM

## 2013-07-24 DIAGNOSIS — G459 Transient cerebral ischemic attack, unspecified: Secondary | ICD-10-CM

## 2013-07-24 DIAGNOSIS — I4891 Unspecified atrial fibrillation: Secondary | ICD-10-CM

## 2013-07-30 ENCOUNTER — Encounter: Payer: Medicare HMO | Admitting: *Deleted

## 2013-07-30 ENCOUNTER — Encounter: Payer: Self-pay | Admitting: Internal Medicine

## 2013-07-30 DIAGNOSIS — I495 Sick sinus syndrome: Secondary | ICD-10-CM

## 2013-07-30 DIAGNOSIS — Z950 Presence of cardiac pacemaker: Secondary | ICD-10-CM

## 2013-07-30 LAB — REMOTE PACEMAKER DEVICE
AL AMPLITUDE: 2 mv
ATRIAL PACING PM: 94
BAMS-0001: 150 {beats}/min
RV LEAD THRESHOLD: 1.125 V
VENTRICULAR PACING PM: 14

## 2013-08-03 ENCOUNTER — Encounter: Payer: Self-pay | Admitting: *Deleted

## 2013-08-20 ENCOUNTER — Other Ambulatory Visit: Payer: Self-pay | Admitting: Internal Medicine

## 2013-08-21 ENCOUNTER — Ambulatory Visit (INDEPENDENT_AMBULATORY_CARE_PROVIDER_SITE_OTHER): Payer: Medicare HMO | Admitting: *Deleted

## 2013-08-21 DIAGNOSIS — I4891 Unspecified atrial fibrillation: Secondary | ICD-10-CM

## 2013-08-21 DIAGNOSIS — G459 Transient cerebral ischemic attack, unspecified: Secondary | ICD-10-CM

## 2013-08-21 DIAGNOSIS — Z7901 Long term (current) use of anticoagulants: Secondary | ICD-10-CM

## 2013-08-21 LAB — POCT INR: INR: 2.8

## 2013-08-22 ENCOUNTER — Other Ambulatory Visit: Payer: Self-pay | Admitting: Cardiology

## 2013-08-22 MED ORDER — WARFARIN SODIUM 5 MG PO TABS: ORAL_TABLET | ORAL | Status: DC

## 2013-09-10 ENCOUNTER — Other Ambulatory Visit: Payer: Self-pay | Admitting: Internal Medicine

## 2013-09-25 ENCOUNTER — Ambulatory Visit (INDEPENDENT_AMBULATORY_CARE_PROVIDER_SITE_OTHER): Payer: Medicare HMO | Admitting: *Deleted

## 2013-09-25 DIAGNOSIS — G459 Transient cerebral ischemic attack, unspecified: Secondary | ICD-10-CM

## 2013-09-25 DIAGNOSIS — Z7901 Long term (current) use of anticoagulants: Secondary | ICD-10-CM

## 2013-09-25 DIAGNOSIS — I4891 Unspecified atrial fibrillation: Secondary | ICD-10-CM

## 2013-09-25 LAB — POCT INR: INR: 3.4

## 2013-10-02 ENCOUNTER — Other Ambulatory Visit: Payer: Self-pay | Admitting: Internal Medicine

## 2013-10-23 ENCOUNTER — Ambulatory Visit (INDEPENDENT_AMBULATORY_CARE_PROVIDER_SITE_OTHER): Payer: Medicare HMO | Admitting: *Deleted

## 2013-10-23 DIAGNOSIS — Z5181 Encounter for therapeutic drug level monitoring: Secondary | ICD-10-CM

## 2013-10-23 DIAGNOSIS — I4891 Unspecified atrial fibrillation: Secondary | ICD-10-CM

## 2013-10-23 DIAGNOSIS — G459 Transient cerebral ischemic attack, unspecified: Secondary | ICD-10-CM

## 2013-10-23 DIAGNOSIS — Z7901 Long term (current) use of anticoagulants: Secondary | ICD-10-CM

## 2013-10-23 LAB — POCT INR: INR: 3.3

## 2013-10-31 ENCOUNTER — Ambulatory Visit (INDEPENDENT_AMBULATORY_CARE_PROVIDER_SITE_OTHER): Payer: Medicare HMO | Admitting: *Deleted

## 2013-10-31 DIAGNOSIS — I4891 Unspecified atrial fibrillation: Secondary | ICD-10-CM

## 2013-10-31 DIAGNOSIS — I495 Sick sinus syndrome: Secondary | ICD-10-CM

## 2013-11-06 LAB — MDC_IDC_ENUM_SESS_TYPE_REMOTE
Battery Remaining Longevity: 85 mo
Brady Statistic AP VP Percent: 14 %
Brady Statistic AP VS Percent: 80 %
Brady Statistic AS VS Percent: 5.6 %
Brady Statistic RA Percent Paced: 94 %
Brady Statistic RV Percent Paced: 14 %
Date Time Interrogation Session: 20150204070012
Lead Channel Impedance Value: 460 Ohm
Lead Channel Pacing Threshold Amplitude: 0.875 V
Lead Channel Pacing Threshold Pulse Width: 0.4 ms
Lead Channel Pacing Threshold Pulse Width: 0.4 ms
Lead Channel Sensing Intrinsic Amplitude: 2.5 mV
Lead Channel Setting Pacing Amplitude: 1.5 V
Lead Channel Setting Pacing Amplitude: 1.875
Lead Channel Setting Sensing Sensitivity: 2 mV
MDC IDC MSMT BATTERY VOLTAGE: 2.95 V
MDC IDC MSMT LEADCHNL RA IMPEDANCE VALUE: 410 Ohm
MDC IDC MSMT LEADCHNL RV PACING THRESHOLD AMPLITUDE: 1.25 V
MDC IDC MSMT LEADCHNL RV SENSING INTR AMPL: 12 mV
MDC IDC PG SERIAL: 7196227
MDC IDC SET LEADCHNL RV PACING PULSEWIDTH: 0.4 ms
MDC IDC STAT BRADY AS VP PERCENT: 1 %

## 2013-11-16 ENCOUNTER — Ambulatory Visit (INDEPENDENT_AMBULATORY_CARE_PROVIDER_SITE_OTHER): Payer: Medicare HMO | Admitting: *Deleted

## 2013-11-16 DIAGNOSIS — Z5181 Encounter for therapeutic drug level monitoring: Secondary | ICD-10-CM

## 2013-11-16 DIAGNOSIS — Z7901 Long term (current) use of anticoagulants: Secondary | ICD-10-CM

## 2013-11-16 DIAGNOSIS — I4891 Unspecified atrial fibrillation: Secondary | ICD-10-CM

## 2013-11-16 DIAGNOSIS — G459 Transient cerebral ischemic attack, unspecified: Secondary | ICD-10-CM

## 2013-11-16 LAB — POCT INR: INR: 2.9

## 2013-11-20 ENCOUNTER — Encounter: Payer: Self-pay | Admitting: *Deleted

## 2013-11-28 ENCOUNTER — Encounter: Payer: Self-pay | Admitting: Internal Medicine

## 2013-12-14 ENCOUNTER — Ambulatory Visit (INDEPENDENT_AMBULATORY_CARE_PROVIDER_SITE_OTHER): Payer: Medicare HMO | Admitting: *Deleted

## 2013-12-14 DIAGNOSIS — Z7901 Long term (current) use of anticoagulants: Secondary | ICD-10-CM

## 2013-12-14 DIAGNOSIS — I4891 Unspecified atrial fibrillation: Secondary | ICD-10-CM

## 2013-12-14 DIAGNOSIS — Z5181 Encounter for therapeutic drug level monitoring: Secondary | ICD-10-CM

## 2013-12-14 DIAGNOSIS — G459 Transient cerebral ischemic attack, unspecified: Secondary | ICD-10-CM

## 2013-12-14 LAB — POCT INR: INR: 2.8

## 2014-01-11 ENCOUNTER — Ambulatory Visit (INDEPENDENT_AMBULATORY_CARE_PROVIDER_SITE_OTHER): Payer: Medicare HMO | Admitting: *Deleted

## 2014-01-11 DIAGNOSIS — G459 Transient cerebral ischemic attack, unspecified: Secondary | ICD-10-CM

## 2014-01-11 DIAGNOSIS — Z5181 Encounter for therapeutic drug level monitoring: Secondary | ICD-10-CM

## 2014-01-11 DIAGNOSIS — Z7901 Long term (current) use of anticoagulants: Secondary | ICD-10-CM

## 2014-01-11 DIAGNOSIS — I4891 Unspecified atrial fibrillation: Secondary | ICD-10-CM

## 2014-01-11 LAB — POCT INR: INR: 3.1

## 2014-01-31 ENCOUNTER — Other Ambulatory Visit: Payer: Self-pay | Admitting: Internal Medicine

## 2014-02-04 ENCOUNTER — Ambulatory Visit (INDEPENDENT_AMBULATORY_CARE_PROVIDER_SITE_OTHER): Payer: Medicare HMO | Admitting: *Deleted

## 2014-02-04 ENCOUNTER — Encounter: Payer: Self-pay | Admitting: Internal Medicine

## 2014-02-04 DIAGNOSIS — I4891 Unspecified atrial fibrillation: Secondary | ICD-10-CM

## 2014-02-04 DIAGNOSIS — I495 Sick sinus syndrome: Secondary | ICD-10-CM

## 2014-02-04 NOTE — Progress Notes (Signed)
Remote pacemaker transmission.   

## 2014-02-05 LAB — MDC_IDC_ENUM_SESS_TYPE_REMOTE
Battery Remaining Longevity: 87 mo
Brady Statistic AS VS Percent: 5 %
Brady Statistic RA Percent Paced: 94 %
Brady Statistic RV Percent Paced: 14 %
Date Time Interrogation Session: 20150511063806
Lead Channel Impedance Value: 380 Ohm
Lead Channel Pacing Threshold Amplitude: 0.75 V
Lead Channel Pacing Threshold Amplitude: 1 V
Lead Channel Sensing Intrinsic Amplitude: 1.9 mV
Lead Channel Sensing Intrinsic Amplitude: 12 mV
Lead Channel Setting Pacing Amplitude: 1.25 V
MDC IDC MSMT BATTERY VOLTAGE: 2.95 V
MDC IDC MSMT LEADCHNL RA IMPEDANCE VALUE: 380 Ohm
MDC IDC MSMT LEADCHNL RA PACING THRESHOLD PULSEWIDTH: 0.4 ms
MDC IDC MSMT LEADCHNL RV PACING THRESHOLD PULSEWIDTH: 0.4 ms
MDC IDC PG SERIAL: 7196227
MDC IDC SET LEADCHNL RA PACING AMPLITUDE: 1.75 V
MDC IDC SET LEADCHNL RV PACING PULSEWIDTH: 0.4 ms
MDC IDC SET LEADCHNL RV SENSING SENSITIVITY: 2 mV
MDC IDC STAT BRADY AP VP PERCENT: 14 %
MDC IDC STAT BRADY AP VS PERCENT: 81 %
MDC IDC STAT BRADY AS VP PERCENT: 1 %

## 2014-02-08 ENCOUNTER — Ambulatory Visit (INDEPENDENT_AMBULATORY_CARE_PROVIDER_SITE_OTHER): Payer: Medicare HMO | Admitting: *Deleted

## 2014-02-08 DIAGNOSIS — G459 Transient cerebral ischemic attack, unspecified: Secondary | ICD-10-CM

## 2014-02-08 DIAGNOSIS — I4891 Unspecified atrial fibrillation: Secondary | ICD-10-CM

## 2014-02-08 DIAGNOSIS — Z7901 Long term (current) use of anticoagulants: Secondary | ICD-10-CM

## 2014-02-08 DIAGNOSIS — Z5181 Encounter for therapeutic drug level monitoring: Secondary | ICD-10-CM

## 2014-02-08 LAB — POCT INR: INR: 2.9

## 2014-02-22 ENCOUNTER — Encounter: Payer: Self-pay | Admitting: Cardiology

## 2014-03-15 ENCOUNTER — Ambulatory Visit (INDEPENDENT_AMBULATORY_CARE_PROVIDER_SITE_OTHER): Payer: Medicare HMO | Admitting: *Deleted

## 2014-03-15 DIAGNOSIS — Z7901 Long term (current) use of anticoagulants: Secondary | ICD-10-CM

## 2014-03-15 DIAGNOSIS — I482 Chronic atrial fibrillation, unspecified: Secondary | ICD-10-CM

## 2014-03-15 DIAGNOSIS — G459 Transient cerebral ischemic attack, unspecified: Secondary | ICD-10-CM

## 2014-03-15 DIAGNOSIS — Z5181 Encounter for therapeutic drug level monitoring: Secondary | ICD-10-CM

## 2014-03-15 DIAGNOSIS — I4891 Unspecified atrial fibrillation: Secondary | ICD-10-CM

## 2014-03-15 LAB — POCT INR: INR: 2.9

## 2014-04-10 ENCOUNTER — Encounter: Payer: Self-pay | Admitting: Internal Medicine

## 2014-04-11 ENCOUNTER — Other Ambulatory Visit: Payer: Self-pay | Admitting: Internal Medicine

## 2014-04-23 ENCOUNTER — Ambulatory Visit (INDEPENDENT_AMBULATORY_CARE_PROVIDER_SITE_OTHER): Payer: Medicare HMO | Admitting: *Deleted

## 2014-04-23 DIAGNOSIS — I4891 Unspecified atrial fibrillation: Secondary | ICD-10-CM

## 2014-04-23 DIAGNOSIS — Z7901 Long term (current) use of anticoagulants: Secondary | ICD-10-CM

## 2014-04-23 DIAGNOSIS — G459 Transient cerebral ischemic attack, unspecified: Secondary | ICD-10-CM

## 2014-04-23 DIAGNOSIS — Z5181 Encounter for therapeutic drug level monitoring: Secondary | ICD-10-CM

## 2014-04-23 LAB — POCT INR: INR: 2.3

## 2014-05-13 ENCOUNTER — Encounter: Payer: Self-pay | Admitting: Internal Medicine

## 2014-05-14 ENCOUNTER — Encounter: Payer: Self-pay | Admitting: Internal Medicine

## 2014-05-14 ENCOUNTER — Ambulatory Visit (INDEPENDENT_AMBULATORY_CARE_PROVIDER_SITE_OTHER): Payer: Medicare HMO | Admitting: Internal Medicine

## 2014-05-14 VITALS — BP 126/66 | HR 78 | Ht 70.0 in | Wt 207.0 lb

## 2014-05-14 DIAGNOSIS — I4891 Unspecified atrial fibrillation: Secondary | ICD-10-CM

## 2014-05-14 DIAGNOSIS — I480 Paroxysmal atrial fibrillation: Secondary | ICD-10-CM

## 2014-05-14 DIAGNOSIS — Z950 Presence of cardiac pacemaker: Secondary | ICD-10-CM

## 2014-05-14 DIAGNOSIS — I10 Essential (primary) hypertension: Secondary | ICD-10-CM

## 2014-05-14 DIAGNOSIS — I495 Sick sinus syndrome: Secondary | ICD-10-CM

## 2014-05-14 NOTE — Assessment & Plan Note (Signed)
His blood pressure is well controlled. Will follow. 

## 2014-05-14 NOTE — Assessment & Plan Note (Signed)
His St. Jude DDD PM is working normally. Will follow.  

## 2014-05-14 NOTE — Assessment & Plan Note (Signed)
He is out of rhythm. I tried to pace him back into NSR. He was in flutter and now in fib after pacing. I have discussed the treatment options in detail. He opts for initiation of Tikosyn. He has been therapeutically anti-coagulated. Will have him come in for labs and an ECG on Thursday morning and start Tikosyn in the hospital Thurs evening.

## 2014-05-14 NOTE — Patient Instructions (Addendum)
Your physician has recommended you make the following change in your medication:  1.) STOP RYTHMOL   Please plan to come to [facility] to meet with the Ysidro Evert, PharmD,  on Thursday AM at 9:00 am prior to hospital admission so that you can begin taking Tikosyn for your heart rhythm.

## 2014-05-14 NOTE — Progress Notes (Signed)
HPI Joshua [name] returns today for followup. He is a 73 yo man with a history of symptomatic bradycardia, status post permanent pacemaker insertion. The patient has a history of paroxysmal atrial fibrillation which has been well-controlled with Rythmol until 2 weeks ago. He has felt tired at times though at other times felt well. No syncope. He does not have palpitations. Allergies  Allergen Reactions  . Contrast Media [Iodinated Diagnostic Agents]   . Sulfonamide Derivatives      Current Outpatient Prescriptions  Medication Sig Dispense Refill  . gabapentin (NEURONTIN) 300 MG capsule Take 300 mg by mouth 3 (three) times daily.       . metoprolol tartrate (LOPRESSOR) 25 MG tablet TAKE 1 TABLET BY MOUTH TWICE DAILY  60 tablet  3  . propafenone (RYTHMOL) 150 MG tablet TAKE 1 TABLET BY MOUTH EVERY 8 HOURS  90 tablet  1  . tadalafil (CIALIS) 5 MG tablet Take 5 mg by mouth daily as needed.      . testosterone cypionate (DEPOTESTOTERONE CYPIONATE) 200 MG/ML injection Inject 100 mg into the muscle every 14 (fourteen) days.      Marland Kitchen warfarin (COUMADIN) 5 MG tablet TAKE 1 TABLET BY MOUTH AS DIRECTED BY ANTICOAGULATION CLINIC  60 tablet  3   No current facility-administered medications for this visit.     Past Medical History  Diagnosis Date  . Essential hypertension, benign   . ERYTHROCYTOSIS   . AF (paroxysmal atrial fibrillation)   . SICK SINUS SYNDROME   . DIZZINESS   . Other malaise and fatigue   . Painful respiration   . NUMBNESS   . CHEST PAIN UNSPECIFIED   . TIA   . Melanoma   . Depression   . Anxiety   . Visual loss     intermittent    ROS:   All systems reviewed and negative except as noted in the HPI.   Past Surgical History  Procedure Laterality Date  . Pacemaker insertion       Family History  Problem Relation Age of Onset  . Prostate cancer Father      History   Social History  . Marital Status: Married    Spouse Name: N/A    Number of Children: N/A  .  Years of Education: N/A   Occupational History  . Not on file.   Social History Main Topics  . Smoking status: Former Research scientist (life sciences)  . Smokeless tobacco: Never Used     Comment: quit in 1976  . Alcohol Use: No  . Drug Use: No  . Sexual Activity: Not on file   Other Topics Concern  . Not on file   Social History Narrative  . No narrative on file     BP 126/66  Pulse 78  Ht 5\' 10"  (1.778 m)  Wt 207 lb (93.895 kg)  BMI 29.70 kg/m2  Physical Exam:  Well appearing middle-aged man, NAD HEENT: Unremarkable Neck: 7 cm JVD, no thyromegally Back:  No CVA tenderness Lungs:  Clear with no wheezes, rales, or rhonchi. HEART:  IRegular rate rhythm, no murmurs, no rubs, no clicks Abd:  soft, positive bowel sounds, no organomegally, no rebound, no guarding Ext:  2 plus pulses, no edema, no cyanosis, no clubbing Skin:  No rashes no nodules Neuro:  CN II through XII intact, motor grossly intact   DEVICE  Normal device function.  See PaceArt for details.   Assess/Plan:

## 2014-05-15 LAB — MDC_IDC_ENUM_SESS_TYPE_INCLINIC
Battery Remaining Longevity: 110.4 mo
Battery Voltage: 2.96 V
Brady Statistic RA Percent Paced: 89 %
Date Time Interrogation Session: 20150818134335
Implantable Pulse Generator Serial Number: 7196227
Lead Channel Impedance Value: 412.5 Ohm
Lead Channel Pacing Threshold Amplitude: 0.875 V
Lead Channel Pacing Threshold Amplitude: 0.875 V
Lead Channel Pacing Threshold Pulse Width: 0.4 ms
Lead Channel Setting Pacing Amplitude: 1.125
Lead Channel Setting Pacing Amplitude: 1.875
Lead Channel Setting Pacing Pulse Width: 0.4 ms
MDC IDC MSMT LEADCHNL RA PACING THRESHOLD PULSEWIDTH: 0.4 ms
MDC IDC MSMT LEADCHNL RA SENSING INTR AMPL: 2.6 mV
MDC IDC MSMT LEADCHNL RV IMPEDANCE VALUE: 425 Ohm
MDC IDC MSMT LEADCHNL RV SENSING INTR AMPL: 12 mV
MDC IDC SET LEADCHNL RV SENSING SENSITIVITY: 2 mV
MDC IDC STAT BRADY RV PERCENT PACED: 16 %

## 2014-05-16 ENCOUNTER — Inpatient Hospital Stay (HOSPITAL_COMMUNITY)
Admission: AD | Admit: 2014-05-16 | Discharge: 2014-05-19 | DRG: 310 | Disposition: A | Discharge status: Home / Self Care | Payer: Medicare HMO | Source: Ambulatory Visit | Attending: Internal Medicine | Admitting: Internal Medicine

## 2014-05-16 ENCOUNTER — Encounter (HOSPITAL_COMMUNITY): Payer: Self-pay | Admitting: General Practice

## 2014-05-16 ENCOUNTER — Ambulatory Visit (INDEPENDENT_AMBULATORY_CARE_PROVIDER_SITE_OTHER): Payer: Medicare HMO | Admitting: Pharmacist

## 2014-05-16 VITALS — BP 128/78 | HR 67 | Ht 70.0 in | Wt 207.0 lb

## 2014-05-16 DIAGNOSIS — I4891 Unspecified atrial fibrillation: Secondary | ICD-10-CM | POA: Diagnosis present

## 2014-05-16 DIAGNOSIS — F329 Major depressive disorder, single episode, unspecified: Secondary | ICD-10-CM | POA: Diagnosis present

## 2014-05-16 DIAGNOSIS — F3289 Other specified depressive episodes: Secondary | ICD-10-CM | POA: Diagnosis present

## 2014-05-16 DIAGNOSIS — Z87891 Personal history of nicotine dependence: Secondary | ICD-10-CM

## 2014-05-16 DIAGNOSIS — K219 Gastro-esophageal reflux disease without esophagitis: Secondary | ICD-10-CM | POA: Diagnosis present

## 2014-05-16 DIAGNOSIS — Z8673 Personal history of transient ischemic attack (TIA), and cerebral infarction without residual deficits: Secondary | ICD-10-CM

## 2014-05-16 DIAGNOSIS — Z950 Presence of cardiac pacemaker: Secondary | ICD-10-CM

## 2014-05-16 DIAGNOSIS — I10 Essential (primary) hypertension: Secondary | ICD-10-CM | POA: Diagnosis present

## 2014-05-16 DIAGNOSIS — Z85820 Personal history of malignant melanoma of skin: Secondary | ICD-10-CM | POA: Diagnosis not present

## 2014-05-16 DIAGNOSIS — Z7901 Long term (current) use of anticoagulants: Secondary | ICD-10-CM

## 2014-05-16 DIAGNOSIS — F411 Generalized anxiety disorder: Secondary | ICD-10-CM | POA: Diagnosis present

## 2014-05-16 DIAGNOSIS — I495 Sick sinus syndrome: Secondary | ICD-10-CM | POA: Diagnosis present

## 2014-05-16 DIAGNOSIS — I480 Paroxysmal atrial fibrillation: Secondary | ICD-10-CM | POA: Diagnosis present

## 2014-05-16 HISTORY — DX: Cardiac arrhythmia, unspecified: I49.9

## 2014-05-16 HISTORY — DX: Nausea with vomiting, unspecified: R11.2

## 2014-05-16 HISTORY — DX: Presence of cardiac pacemaker: Z95.0

## 2014-05-16 HISTORY — DX: Other specified postprocedural states: Z98.890

## 2014-05-16 HISTORY — DX: Gastro-esophageal reflux disease without esophagitis: K21.9

## 2014-05-16 HISTORY — DX: Adverse effect of unspecified anesthetic, initial encounter: T41.45XA

## 2014-05-16 LAB — BASIC METABOLIC PANEL
BUN: 13 mg/dL (ref 6–23)
CHLORIDE: 104 meq/L (ref 96–112)
CO2: 26 mEq/L (ref 19–32)
Calcium: 9.3 mg/dL (ref 8.4–10.5)
Creat: 1.29 mg/dL (ref 0.50–1.35)
Glucose, Bld: 100 mg/dL — ABNORMAL HIGH (ref 70–99)
POTASSIUM: 4.3 meq/L (ref 3.5–5.3)
SODIUM: 141 meq/L (ref 135–145)

## 2014-05-16 LAB — PROTIME-INR
INR: 2.09 — AB (ref <1.50)
PROTHROMBIN TIME: 23.5 s — AB (ref 11.6–15.2)

## 2014-05-16 LAB — MAGNESIUM: Magnesium: 2.1 mg/dL (ref 1.5–2.5)

## 2014-05-16 MED ORDER — POTASSIUM CHLORIDE CRYS ER 20 MEQ PO TBCR: 20.0000 meq | EXTENDED_RELEASE_TABLET | Freq: Every day | ORAL | Status: DC | PRN

## 2014-05-16 MED ORDER — GABAPENTIN 300 MG PO CAPS
300.0000 mg | ORAL_CAPSULE | Freq: Three times a day (TID) | ORAL | Status: DC
  Filled 2014-05-16: qty 1

## 2014-05-16 MED ORDER — SODIUM CHLORIDE 0.9 % IV SOLN
250.0000 mL | INTRAVENOUS | Status: DC | PRN
Start: 2014-05-16 — End: 2014-05-19

## 2014-05-16 MED ORDER — GABAPENTIN 300 MG PO CAPS
300.0000 mg | ORAL_CAPSULE | Freq: Two times a day (BID) | ORAL | Status: DC
  Administered 2014-05-17 – 2014-05-19 (×5): 300 mg via ORAL
  Filled 2014-05-16 (×12): qty 1

## 2014-05-16 MED ORDER — SODIUM CHLORIDE 0.9 % IJ SOLN
3.0000 mL | Freq: Two times a day (BID) | INTRAMUSCULAR | Status: DC
  Administered 2014-05-16 – 2014-05-19 (×6): 3 mL via INTRAVENOUS

## 2014-05-16 MED ORDER — ONDANSETRON HCL 4 MG/2ML IJ SOLN: 4.0000 mg | Freq: Four times a day (QID) | INTRAMUSCULAR | Status: DC | PRN

## 2014-05-16 MED ORDER — MAGNESIUM OXIDE 400 (241.3 MG) MG PO TABS
400.0000 mg | ORAL_TABLET | Freq: Every day | ORAL | Status: DC | PRN
  Filled 2014-05-16: qty 1

## 2014-05-16 MED ORDER — GABAPENTIN 300 MG PO CAPS
600.0000 mg | ORAL_CAPSULE | Freq: Every day | ORAL | Status: DC
  Administered 2014-05-16 – 2014-05-18 (×3): 600 mg via ORAL
  Filled 2014-05-16 (×5): qty 2

## 2014-05-16 MED ORDER — METOPROLOL TARTRATE 25 MG PO TABS
25.0000 mg | ORAL_TABLET | Freq: Two times a day (BID) | ORAL | Status: DC
  Administered 2014-05-16 – 2014-05-19 (×6): 25 mg via ORAL
  Filled 2014-05-16 (×8): qty 1

## 2014-05-16 MED ORDER — WARFARIN - PHARMACIST DOSING INPATIENT: Freq: Every day | Status: DC

## 2014-05-16 MED ORDER — WARFARIN SODIUM 7.5 MG PO TABS
7.5000 mg | ORAL_TABLET | Freq: Every day | ORAL | Status: DC
  Administered 2014-05-16 – 2014-05-18 (×3): 7.5 mg via ORAL
  Filled 2014-05-16 (×6): qty 1

## 2014-05-16 MED ORDER — SODIUM CHLORIDE 0.9 % IJ SOLN: 3.0000 mL | INTRAMUSCULAR | Status: DC | PRN

## 2014-05-16 MED ORDER — ACETAMINOPHEN 325 MG PO TABS: 650.0000 mg | ORAL_TABLET | ORAL | Status: DC | PRN

## 2014-05-16 MED ORDER — DOFETILIDE 500 MCG PO CAPS
500.0000 ug | ORAL_CAPSULE | Freq: Two times a day (BID) | ORAL | Status: DC
  Filled 2014-05-16 (×2): qty 1

## 2014-05-16 MED ORDER — DOFETILIDE 500 MCG PO CAPS
500.0000 ug | ORAL_CAPSULE | Freq: Two times a day (BID) | ORAL | Status: DC
  Administered 2014-05-16 – 2014-05-17 (×2): 500 ug via ORAL
  Filled 2014-05-16 (×4): qty 1

## 2014-05-16 NOTE — H&P (Signed)
Addendum to Dr. Tanna Furry Note on 05/14/2017:  73 yo male with h/o symptomatic bradycardia s/p PPM and PAF who has previously controlled on Rhythmol until 2 weeks ago. He had recurrent PAF while on Rhythmol. After discussing with patient regarding potential alternative, it was decided to admit the patient to start Tikosyn. An EKG was done in the clinic which showed QTc 384, K 4.3 and Mg 2.1. INR 2.09 while on coumadin  ROS: Denies any recent CP or significant SOB. Some fatigue at times. No syncope. No cardiac awareness of PAF, no palpitation. No significant bleeding issues   Physical exam:  Heart: RRR, no S3, S4 Lung: CTA bilaterally, mild intermittent rale near the bases cleared with cough HEENT: no carotid bruits, no JVD General: normal, no acute complaints, sitting comfortably Peripheral: no edema, well perfused   Plan:  1. PAF:   - start Tykosyn at 500mg  BID, CrCl 67  - keep K>4.0 and Mg >2.0. Daily EKG to monitor QTc  - continue coumadin for now, manage by pharmacy. Continue BB  2. H/o bradycardia/SSS s/p PPM 3. HTN    Signed, Almyra Deforest PA Pager: [phone]

## 2014-05-16 NOTE — Assessment & Plan Note (Addendum)
Reviewed pt's labs.  K- 4.3, Mg, 2.1.  SCr- 1.29.  INR 2.09.  QTc- 321msec.  Labs acceptable to start Tikosyn.  CrCl- 78mL/min.  Would recommend starting 527mcg BID.  Pt aware to report to hospital.  Will be placed in 3W Room 2.

## 2014-05-16 NOTE — H&P (Signed)
Joshua [name] is an 73 y.o. male.   Chief Complaint: "I am here to start Tikosyn" HPI: The patient is a 73 yo man with PAF, symptomatic bradycardia, anxiety and depression. He has had progressive worsening of his atrial fibrillation and he has had a rapid ventricular response. He has continued to take his rhythmol but this medication has become ineffective. He presents today for initiation of Tikosyn. He has not had syncope. He is s/p PPM insertion.  Past Medical History  Diagnosis Date  . Essential hypertension, benign   . ERYTHROCYTOSIS   . AF (paroxysmal atrial fibrillation)   . SICK SINUS SYNDROME   . DIZZINESS   . Other malaise and fatigue   . Painful respiration   . NUMBNESS   . CHEST PAIN UNSPECIFIED   . TIA   . Melanoma   . Depression   . Anxiety   . Visual loss     intermittent  . Complication of anesthesia   . PONV (postoperative nausea and vomiting)   . Pacemaker ? 2012  . GERD (gastroesophageal reflux disease)   . Dysrhythmia     hx of atrial fibrilation    Past Surgical History  Procedure Laterality Date  . Pacemaker insertion    . Insert / replace / remove pacemaker    . Cholecystectomy    . Vasectomy    . Lymph node biopsy Left   . Skin cancer removal    . Colonoscopy  2013    Family History  Problem Relation Age of Onset  . Prostate cancer Father    Social History:  reports that he has quit smoking. He has never used smokeless tobacco. He reports that he does not drink alcohol or use illicit drugs.  Allergies:  Allergies  Allergen Reactions  . Codeine     Vomiting   . Contrast Media [Iodinated Diagnostic Agents]     Hives   . Sulfonamide Derivatives     Breathing trouble     Medications Prior to Admission  Medication Sig Dispense Refill  . gabapentin (NEURONTIN) 300 MG capsule Take 300 mg by mouth 3 (three) times daily.       . metoprolol tartrate (LOPRESSOR) 25 MG tablet Take 25 mg by mouth 2 (two) times daily.      . tadalafil (CIALIS) 5  MG tablet Take 5 mg by mouth daily as needed. Take for urine function      . testosterone cypionate (DEPOTESTOTERONE CYPIONATE) 200 MG/ML injection Inject 100 mg into the muscle every 14 (fourteen) days.      Marland Kitchen warfarin (COUMADIN) 5 MG tablet Take 2.5-5 mg by mouth daily. Take total of 7.5mg  every day patient splits the 5mg  and splits it in half to make total of 7.5mg         Results for orders placed in visit on 05/16/14 (from the past 48 hour(s))  BASIC METABOLIC PANEL     Status: Abnormal   Collection Time    05/16/14  9:46 AM      Result Value Ref Range   Sodium 141  135 - 145 mEq/L   Potassium 4.3  3.5 - 5.3 mEq/L   Chloride 104  96 - 112 mEq/L   CO2 26  19 - 32 mEq/L   Glucose, Bld 100 (*) 70 - 99 mg/dL   BUN 13  6 - 23 mg/dL   Creat 1.29  0.50 - 1.35 mg/dL   Calcium 9.3  8.4 - 10.5 mg/dL  PROTIME-INR  Status: Abnormal   Collection Time    05/16/14  9:46 AM      Result Value Ref Range   Prothrombin Time 23.5 (*) 11.6 - 15.2 seconds   INR 2.09 (*) <1.50   Comment: The INR is of principal utility in following patients on stable doses     of oral anticoagulants.  The therapeutic range is generally 2.0 to     3.0, but may be 3.0 to 4.0 in patients with mechanical cardiac valves,     recurrent embolisms and antiphospholipid antibodies (including lupus     inhibitors).  MAGNESIUM     Status: None   Collection Time    05/16/14 12:54 PM      Result Value Ref Range   Magnesium 2.1  1.5 - 2.5 mg/dL   No results found.  ROS  Blood pressure 142/76, pulse 86, temperature 98.9 F (37.2 C), temperature source Oral, resp. rate 18, height 5\' 10"  (1.778 m), weight 198 lb (89.812 kg), SpO2 97.00%. Physical Exam  Well appearing 73 yo man, NAD HEENT: Unremarkable,, AT Neck:  6 JVD, no thyromegally Back:  No CVA tenderness Lungs:  Clear with no wheezes, rales, or rhonchi HEART:  Regular rate rhythm, no murmurs, no rubs, no clicks Abd:  soft, positive bowel sounds, no  organomegally, no rebound, no guarding Ext:  2 plus pulses, no edema, no cyanosis, no clubbing Skin:  No rashes no nodules Neuro:  CN II through XII intact, motor grossly intact  EKG: atrial fib with a controlled VR  Assessment/Plan 1. Persistent atrial fib 2. Symptomatic tachybrady, s/p ppm I have recommended initiation of Tikosyn therapy. He presents today for initiation. Will plan to initiate today and follow QT interval carefully.  Cintya Daughety,M.D. 05/16/2014, 9:32 PM

## 2014-05-16 NOTE — Progress Notes (Addendum)
ANTICOAGULATION CONSULT NOTE - Initial Consult  Pharmacy Consult for Coumadin Indication: atrial fibrillation  Allergies  Allergen Reactions  . Codeine     Vomiting   . Contrast Media [Iodinated Diagnostic Agents]     Hives   . Sulfonamide Derivatives     Breathing trouble     Patient Measurements: Height: 5\' 10"  (177.8 cm) Weight: 198 lb (89.812 kg) IBW/kg (Calculated) : 73 Heparin Dosing Weight:    Vital Signs: Temp: 98.6 F (37 C) (08/20 1500) Temp src: Oral (08/20 1500) BP: 134/68 mmHg (08/20 1500) Pulse Rate: 67 (08/20 1500)  Labs:  Recent Labs  05/16/14 0946  LABPROT 23.5*  INR 2.09*  CREATININE 1.29    Estimated Creatinine Clearance: 60.1 ml/min (by C-G formula based on Cr of 1.29).   Medical History: Past Medical History  Diagnosis Date  . Essential hypertension, benign   . ERYTHROCYTOSIS   . AF (paroxysmal atrial fibrillation)   . SICK SINUS SYNDROME   . DIZZINESS   . Other malaise and fatigue   . Painful respiration   . NUMBNESS   . CHEST PAIN UNSPECIFIED   . TIA   . Melanoma   . Depression   . Anxiety   . Visual loss     intermittent    Assessment: 73 y/o M with h/o afib present to [facility] for Tikosyn initiation. Continue Coumadin while inpatient. Admit INR 2.09 on Coumadin 7.5mg  daily.  Patient Exclusion Criteria: If any screening criteria checked as "Yes", then  patient  should NOT receive dofetilide until criteria item is corrected. If "Yes" please indicate correction plan.  YES  NO Patient  Exclusion Criteria Correction Plan  []  [x]  Baseline QTc interval is greater than or equal to 440 msec. IF above YES box checked dofetilide contraindicated unless patient has ICD; then may proceed if QTc 500-550 msec or with known ventricular conduction abnormalities may proceed with QTc 550-600 msec. QTc =     []  [x]  Magnesium level is less than 1.8 mEq/l : Last magnesium:  Lab Results  Component Value Date   MG 2.1 05/16/2014         []  [x]   Potassium level is less than 4 mEq/l : Last potassium:  Lab Results  Component Value Date   K 4.3 05/16/2014         []  [x]  Patient is known or suspected to have a digoxin level greater than 2 ng/ml: No results found for this basename: DIGOXIN      []  [x]  Creatinine clearance less than 20 ml/min (calculated using Cockcroft-Gault, actual body weight and serum creatinine): Estimated Creatinine Clearance: 60.1 ml/min (by C-G formula based on Cr of 1.29).    []  [x]  Patient has received drugs known to prolong the QT intervals within the last 48 hours(phenothiazines, tricyclics or tetracyclic antidepressants, erythromycin, H-1 antihistamines, cisapride, fluoroquinolones, azithromycin). Drugs not listed above may have an, as yet, undetected potential to prolong the QT interval, updated information on QT prolonging agents is available at this website:QT prolonging agents   []  [x]  Patient received a dose of hydrochlorothiazide (Oretic) alone or in any combination including triamterene (Dyazide, Maxzide) in the last 48 hours.   []  [x]  Patient received a medication known to increase dofetilide plasma concentrations prior to initial dofetilide dose:    Trimethoprim (Primsol, Proloprim) in the last 36 hours   Verapamil (Calan, Verelan) in the last 36 hours or a sustained release dose in the last 72 hours   Megestrol (Megace) in the last 5  days    Cimetidine (Tagamet) in the last 6 hours   Ketoconazole (Nizoral) in the last 24 hours   Itraconazole (Sporanox) in the last 48 hours    Prochlorperazine (Compazine) in the last 36 hours    []  [x]  Patient is known to have a history of torsades de pointes; congenital or acquired long QT syndromes.   []  [x]  Patient has received a Class 1 antiarrhythmic with less than 2 half-lives since last dose. (Disopyramide, Quinidine, Procainamide, Lidocaine, Mexiletine, Flecainide, Propafenone)   []  [x]  Patient has received amiodarone therapy in the past 3 months or  amiodarone level is greater than 0.3 ng/ml.    Patient has been appropriately anticoagulated with Coumadin.  Ordering provider was confirmed at [email or web address] if they are not listed on the [place] Prescribers list.  Outpatient EKG with QTc 384, K+4.3, Mg 2.1, CrCl with actual BW 67.  Goal of Therapy:  INR 2-3 Monitor platelets by anticoagulation protocol: Yes   Plan:  --Coumadin 7.5mg  po q1800 --Daily INR  1.  Initiate dofetilide based on renal function: [employer] One Calculated CrCl  Dose q12h  [x]  > 60 ml/min 500 mcg  []  40-60 ml/min 250 mcg  []  20-40 ml/min 125 mcg   2. Follow up QTc after the first 5 doses, renal function, electrolytes (K & Mg) daily x 3     days, dose adjustment, success of initiation and facilitate 1 week discharge supply as     clinically indicated.  3. Initiate Tikosyn education video (Call [phone] and ask for video # [phone]).  4. Place Enrollment Form on the chart for discharge supply of dofetilide.     Charod Slawinski S. Alford Highland, PharmD, BCPS Clinical Staff Pharmacist Pager [phone]  Eilene Ghazi Stillinger 05/16/2014,3:46 PM

## 2014-05-16 NOTE — Progress Notes (Signed)
Patient is a 73 year old WM presenting today for Tikosyn initiation.  Patient was seen by Dr. Lovena Le on 05/14/14.  The patient has a history of paroxysmal atrial fibrillation which has been well controlled with Rythmol until 2 weeks ago.  He has felt tired at times, but felt well at other times.  Patient stopped rythmol tid following his 4 AM dose on 05/14/14, so has been off rythmol for over 48 hours.  Dr. Lovena Le discussed treatment options with patient, and he elected to proceed with Tikosyn initiation.    Patient and wife educated on potential adverse effects of Tikosyn including QTc prolongation.  He is aware of the importance of compliance and will call the office if he misses than 2 doses in a row.  Discussed the cost of the medication as I called his insurance company and confirmed it would be $50 per month (Tier 3), and he tells me that this cost is acceptable.    Medication list reviewed.  Patient is not on any QTc prolongating or contraindicated medications.  He stopped his Rhythmol over 48 hours ago.  Patient is on anticoagulation with warfarin and his INR has been 2.0 or higher for over a month now.  Will check an INR today with his other labs.  EKG from 05/16/14 reviewed by Dr. Caryl Comes showed atrial fibrilation with v rate of 67 bpm and QTc of 384.    Current Outpatient Prescriptions  Medication Sig Dispense Refill  . gabapentin (NEURONTIN) 300 MG capsule Take 300 mg by mouth 3 (three) times daily.       . metoprolol tartrate (LOPRESSOR) 25 MG tablet TAKE 1 TABLET BY MOUTH TWICE DAILY  60 tablet  3  . tadalafil (CIALIS) 5 MG tablet Take 5 mg by mouth daily as needed.      . testosterone cypionate (DEPOTESTOTERONE CYPIONATE) 200 MG/ML injection Inject 100 mg into the muscle every 14 (fourteen) days.      Marland Kitchen warfarin (COUMADIN) 5 MG tablet TAKE 1 TABLET BY MOUTH AS DIRECTED BY ANTICOAGULATION CLINIC  60 tablet  3   No current facility-administered medications for this visit.   Allergies   Allergen Reactions  . Contrast Media [Iodinated Diagnostic Agents]   . Sulfonamide Derivatives

## 2014-05-17 LAB — BASIC METABOLIC PANEL
ANION GAP: 10 (ref 5–15)
BUN: 12 mg/dL (ref 6–23)
CHLORIDE: 105 meq/L (ref 96–112)
CO2: 26 meq/L (ref 19–32)
Calcium: 9 mg/dL (ref 8.4–10.5)
Creatinine, Ser: 1.24 mg/dL (ref 0.50–1.35)
GFR calc non Af Amer: 57 mL/min — ABNORMAL LOW (ref >90)
GFR, EST AFRICAN AMERICAN: 66 mL/min — AB (ref >90)
Glucose, Bld: 90 mg/dL (ref 70–99)
Potassium: 4.1 mEq/L (ref 3.7–5.3)
Sodium: 141 mEq/L (ref 137–147)

## 2014-05-17 LAB — MAGNESIUM: MAGNESIUM: 2 mg/dL (ref 1.5–2.5)

## 2014-05-17 LAB — PROTIME-INR
INR: 2.35 — ABNORMAL HIGH (ref 0.00–1.49)
Prothrombin Time: 25.7 seconds — ABNORMAL HIGH (ref 11.6–15.2)

## 2014-05-17 MED ORDER — DOFETILIDE 500 MCG PO CAPS
500.0000 ug | ORAL_CAPSULE | Freq: Two times a day (BID) | ORAL | Status: DC
  Administered 2014-05-17 – 2014-05-19 (×4): 500 ug via ORAL
  Filled 2014-05-17 (×6): qty 1

## 2014-05-17 NOTE — Progress Notes (Signed)
UR Completed Rada Zegers Graves-Bigelow, RN,BSN [phone]  

## 2014-05-17 NOTE — Progress Notes (Addendum)
Patient rang out V-tach on the monitor, HR 117, MD T.Turner made aware. MD stated that patient had a run of atrial tachycardia.  Ordered to hold Tikosyn dose until patient is seen by Dr. Lovena Le. Will continue to monitor patient.

## 2014-05-17 NOTE — Care Management Note (Signed)
    Page 1 of 1   05/17/2014     10:29:04 AM CARE MANAGEMENT NOTE 05/17/2014  Patient:  [facility]   Account Number:  [id number]  Date Initiated:  05/17/2014  Documentation initiated by:  GRAVES-BIGELOW,Nalani Andreen  Subjective/Objective Assessment:   Pt admitted for Tikosyn Load.     Action/Plan:   Benefits check in process for co pay cost. Pt will need a 7 day Rx written to be filled via main pharmacy and CM will assist. Pt uses [facility] and they can order Tikosyn. Co pay will be 50.00. Pt is aware.   Anticipated DC Date:  05/20/2014   Anticipated DC Plan:  [place]  CM consult  Medication Assistance      Choice offered to / List presented to:             Status of service:  Completed, signed off Medicare Important Message given?  YES (If response is "NO", the following Medicare IM given date fields will be blank) Date Medicare IM given:  05/17/2014 Medicare IM given by:  GRAVES-BIGELOW,Ileane Sando Date Additional Medicare IM given:   Additional Medicare IM given by:    Discharge Disposition:  HOME/SELF CARE  Per UR Regulation:  Reviewed for med. necessity/level of care/duration of stay  If discussed at [place] of Stay Meetings, dates discussed:    Comments:

## 2014-05-17 NOTE — Discharge Instructions (Signed)

## 2014-05-17 NOTE — Progress Notes (Signed)
ANTICOAGULATION CONSULT NOTE - Follow-up  Pharmacy Consult for Coumadin Indication: atrial fibrillation  Allergies  Allergen Reactions  . Codeine     Vomiting   . Contrast Media [Iodinated Diagnostic Agents]     Hives   . Sulfonamide Derivatives     Breathing trouble     Patient Measurements: Height: 5\' 10"  (177.8 cm) Weight: 198 lb (89.812 kg) IBW/kg (Calculated) : 73  Vital Signs: Temp: 99 F (37.2 C) (08/21 0434) Temp src: Oral (08/21 0434) BP: 135/83 mmHg (08/21 1027) Pulse Rate: 78 (08/21 1027)  Labs:  Recent Labs  05/16/14 0946 05/17/14 0407  LABPROT 23.5* 25.7*  INR 2.09* 2.35*  CREATININE 1.29 1.24    Estimated Creatinine Clearance: 62.5 ml/min (by C-G formula based on Cr of 1.24).  Assessment: 73 y/o M with h/o afib present to [facility] for Tikosyn initiation. He continues on coumadin with a therapeutic INR of 2.35 on his home regimen of 7.5mg  daily. A CBC has not been checked. No bleeding noted.   Goal: INR 2-3  Plan: 1. Continue warfarin 7.5mg  PO daily 2. F/u AM INR 3. CBC in AM  Salome Arnt, PharmD, BCPS Pager # [phone] 05/17/2014 10:42 AM

## 2014-05-17 NOTE — Progress Notes (Signed)
   ELECTROPHYSIOLOGY ROUNDING NOTE    Patient Name: Joshua [name] Date of Encounter: 05/17/2014    SUBJECTIVE:Patient feels well this morning.  No chest pain or shortness of breath.  Some fatigue. Did not sleep well.  Admitted 05-16-14 for Tikosyn loading.    Labs, QTc stable.   TELEMETRY: Reviewed telemetry pt in atrial pacing with intrinsic ventricular conduction, intermittent tracking of atrial flutter   Filed Vitals:   05/16/14 1500 05/16/14 2005 05/17/14 0434  BP: 134/68 142/76 118/83  Pulse: 67 86 82  Temp: 98.6 F (37 C) 98.9 F (37.2 C) 99 F (37.2 C)  TempSrc: Oral Oral Oral  Resp: 17 18 18   Height: 5\' 10"  (1.778 m)    Weight: 198 lb (89.812 kg)  198 lb (89.812 kg)  SpO2: 97% 97% 95%    Intake/Output Summary (Last 24 hours) at 05/17/14 0744 Last data filed at 05/16/14 2132  Gross per 24 hour  Intake      3 ml  Output      0 ml  Net      3 ml    CURRENT MEDICATIONS: . dofetilide  500 mcg Oral BID  . gabapentin  300 mg Oral BID  . gabapentin  600 mg Oral QHS  . metoprolol tartrate  25 mg Oral BID  . sodium chloride  3 mL Intravenous Q12H  . warfarin  7.5 mg Oral q1800  . Warfarin - Pharmacist Dosing Inpatient   Does not apply q1800    LABS: Basic Metabolic Panel:  Recent Labs  05/16/14 0946 05/16/14 1254 05/17/14 0407  NA 141  --  141  K 4.3  --  4.1  CL 104  --  105  CO2 26  --  26  GLUCOSE 100*  --  90  BUN 13  --  12  CREATININE 1.29  --  1.24  CALCIUM 9.3  --  9.0  MG  --  2.1 2.0    PHYSICAL EXAM Well appearing 73 yo man, NAD HEENT: Unremarkable,[place], AT Neck:  6 JVD, no thyromegally Back:  No CVA tenderness Lungs:  Clear with no wheezes, rales, or rhonchi HEART:  Regular rate rhythm, no murmurs, no rubs, no clicks Abd:  soft, positive bowel sounds, no organomegally, no rebound, no guarding Ext:  2 plus pulses, no edema, no cyanosis, no clubbing Skin:  No rashes no nodules Neuro:  CN II through XII intact, motor grossly  intact    Active Problems:   Atrial fibrillation   A-fib   A/P 1. Atrial fib with an RVR 2. Symptomatic bradycardia, s/p PPM 3. Admit for tikosyn  Will have industry interrogate device and reprogram to prevent inappropriate tracking of atrial flutter and plan to observe with dc home Sunday early p.m.  Mikle Bosworth.D.

## 2014-05-18 LAB — CBC
HCT: 46.8 % (ref 39.0–52.0)
Hemoglobin: 15.8 g/dL (ref 13.0–17.0)
MCH: 29.5 pg (ref 26.0–34.0)
MCHC: 33.8 g/dL (ref 30.0–36.0)
MCV: 87.3 fL (ref 78.0–100.0)
PLATELETS: 199 10*3/uL (ref 150–400)
RBC: 5.36 MIL/uL (ref 4.22–5.81)
RDW: 15.7 % — AB (ref 11.5–15.5)
WBC: 10 10*3/uL (ref 4.0–10.5)

## 2014-05-18 LAB — BASIC METABOLIC PANEL
ANION GAP: 12 (ref 5–15)
BUN: 13 mg/dL (ref 6–23)
CO2: 22 mEq/L (ref 19–32)
Calcium: 8.9 mg/dL (ref 8.4–10.5)
Chloride: 104 mEq/L (ref 96–112)
Creatinine, Ser: 1.13 mg/dL (ref 0.50–1.35)
GFR, EST AFRICAN AMERICAN: 74 mL/min — AB (ref >90)
GFR, EST NON AFRICAN AMERICAN: 64 mL/min — AB (ref >90)
Glucose, Bld: 97 mg/dL (ref 70–99)
POTASSIUM: 4.3 meq/L (ref 3.7–5.3)
SODIUM: 138 meq/L (ref 137–147)

## 2014-05-18 LAB — PROTIME-INR
INR: 2.39 — ABNORMAL HIGH (ref 0.00–1.49)
PROTHROMBIN TIME: 26.1 s — AB (ref 11.6–15.2)

## 2014-05-18 LAB — MAGNESIUM: Magnesium: 2.1 mg/dL (ref 1.5–2.5)

## 2014-05-18 NOTE — Progress Notes (Signed)
ANTICOAGULATION CONSULT NOTE - Follow Up Consult  Pharmacy Consult for Coumadin Indication: Afib  Allergies  Allergen Reactions  . Codeine     Vomiting   . Contrast Media [Iodinated Diagnostic Agents]     Hives   . Sulfonamide Derivatives     Breathing trouble     Patient Measurements: Height: 5\' 10"  (177.8 cm) Weight: 197 lb 13.6 oz (89.744 kg) IBW/kg (Calculated) : 73  Vital Signs: Temp: 98.1 F (36.7 C) (08/22 0444) Temp src: Oral (08/22 0444) BP: 138/77 mmHg (08/22 0444) Pulse Rate: 100 (08/22 0444)  Labs:  Recent Labs  05/16/14 0946 05/17/14 0407 05/18/14 0545  HGB  --   --  15.8  HCT  --   --  46.8  PLT  --   --  199  LABPROT 23.5* 25.7* 26.1*  INR 2.09* 2.35* 2.39*  CREATININE 1.29 1.24 1.13    Estimated Creatinine Clearance: 68.6 ml/min (by C-G formula based on Cr of 1.13).  Assessment: 73 yo M w/ hx of Afib present to [facility] for Tikosyn initiation. He continues on coumadin with a therapeutic INR of 2.39 on his home regimen of 7.5mg  daily. No signs of bleeding. CBC ok  Goal of Therapy:  INR 2-3 Monitor platelets by anticoagulation protocol: Yes   Plan:  Continue coumadin 7.5mg  PO daily Daily CBC / INR Monitor s/sx of bleeding  Merced Hanners J 05/18/2014,8:16 AM

## 2014-05-18 NOTE — Progress Notes (Signed)
Patient ID: Joshua [name], male   DOB: Apr 10, 1941, 73 y.o.   MRN: [id number]    Patient Name: Joshua [name] Date of Encounter: 05/18/2014    SUBJECTIVE:Patient feels well this morning.  No chest pain or shortness of breath.   Indicates pacemaker company in to reprogram pacer yesterday  Admitted 05-16-14 for Tikosyn loading.    Labs, QTc stable.   TELEMETRY: NSR this am but ECG this am with atrial flutter   Filed Vitals:   05/17/14 1327 05/17/14 1927 05/17/14 2150 05/18/14 0444  BP: 121/70 130/76 131/85 138/77  Pulse: 64 80 95 100  Temp: 98.9 F (37.2 C) 98.4 F (36.9 C)  98.1 F (36.7 C)  TempSrc: Oral Oral  Oral  Resp: 16 16  18   Height:      Weight:    197 lb 13.6 oz (89.744 kg)  SpO2: 96% 98%  98%    Intake/Output Summary (Last 24 hours) at 05/18/14 0835 Last data filed at 05/18/14 7622  Gross per 24 hour  Intake    123 ml  Output      0 ml  Net    123 ml    CURRENT MEDICATIONS: . dofetilide  500 mcg Oral BID  . gabapentin  300 mg Oral BID  . gabapentin  600 mg Oral QHS  . metoprolol tartrate  25 mg Oral BID  . sodium chloride  3 mL Intravenous Q12H  . warfarin  7.5 mg Oral q1800  . Warfarin - Pharmacist Dosing Inpatient   Does not apply q1800    LABS: Basic Metabolic Panel:  Recent Labs  05/17/14 0407 05/18/14 0545  NA 141 138  K 4.1 4.3  CL 105 104  CO2 26 22  GLUCOSE 90 97  BUN 12 13  CREATININE 1.24 1.13  CALCIUM 9.0 8.9  MG 2.0 2.1    PHYSICAL EXAM Well appearing 73 yo man, NAD HEENT: Unremarkable,[place], AT Neck:  6 JVD, no thyromegally Back:  No CVA tenderness Lungs:  Clear with no wheezes, rales, or rhonchi HEART:  Regular rate rhythm, no murmurs, no rubs, no clicks Abd:  soft, positive bowel sounds, no organomegally, no rebound, no guarding Ext:  2 plus pulses, no edema, no cyanosis, no clubbing Skin:  No rashes no nodules Neuro:  CN II through XII intact, motor grossly intact    Active Problems:   Atrial fibrillation   A-fib     A/P 1. Atrial fib with an RVR 2. Symptomatic bradycardia, s/p PPM 3. Admit for tikosyn  PAF:  Tikosyn load  QT ok  D/C tomorrow INR has been stable Pacer reprogrammed

## 2014-05-19 LAB — PROTIME-INR
INR: 2.06 — ABNORMAL HIGH (ref 0.00–1.49)
PROTHROMBIN TIME: 23.2 s — AB (ref 11.6–15.2)

## 2014-05-19 LAB — BASIC METABOLIC PANEL
ANION GAP: 12 (ref 5–15)
BUN: 16 mg/dL (ref 6–23)
CALCIUM: 9 mg/dL (ref 8.4–10.5)
CO2: 24 mEq/L (ref 19–32)
Chloride: 104 mEq/L (ref 96–112)
Creatinine, Ser: 1.16 mg/dL (ref 0.50–1.35)
GFR, EST AFRICAN AMERICAN: 72 mL/min — AB (ref >90)
GFR, EST NON AFRICAN AMERICAN: 62 mL/min — AB (ref >90)
GLUCOSE: 92 mg/dL (ref 70–99)
POTASSIUM: 4 meq/L (ref 3.7–5.3)
Sodium: 140 mEq/L (ref 137–147)

## 2014-05-19 LAB — MAGNESIUM: MAGNESIUM: 2 mg/dL (ref 1.5–2.5)

## 2014-05-19 MED ORDER — DOFETILIDE 500 MCG PO CAPS: 500.0000 ug | ORAL_CAPSULE | Freq: Two times a day (BID) | ORAL | Status: DC

## 2014-05-19 NOTE — Progress Notes (Signed)
ANTICOAGULATION CONSULT NOTE - Follow Up Consult  Pharmacy Consult for warfarin Indication: Afib  Allergies  Allergen Reactions  . Codeine     Vomiting   . Contrast Media [Iodinated Diagnostic Agents]     Hives   . Sulfonamide Derivatives     Breathing trouble     Patient Measurements: Height: 5\' 10"  (177.8 cm) Weight: 197 lb 5 oz (89.5 kg) IBW/kg (Calculated) : 73  Vital Signs: Temp: 98.2 F (36.8 C) (08/23 0558) Temp src: Oral (08/23 0558) BP: 138/90 mmHg (08/23 0558) Pulse Rate: 105 (08/23 0558)  Labs:  Recent Labs  05/17/14 0407 05/18/14 0545 05/19/14 0444  HGB  --  15.8  --   HCT  --  46.8  --   PLT  --  199  --   LABPROT 25.7* 26.1* 23.2*  INR 2.35* 2.39* 2.06*  CREATININE 1.24 1.13 1.16    Estimated Creatinine Clearance: 66.7 ml/min (by C-G formula based on Cr of 1.16).  Assessment: 73 yo M w/ hx of Afib present to [facility] for Tikosyn initiation. He continues on coumadin with a therapeutic INR of 2.06 on his home regimen of 7.5mg  daily. No signs of bleeding. CBC stable.  Goal of Therapy:  INR 2-3 Monitor platelets by anticoagulation protocol: Yes   Plan:  Continue coumadin 7.5mg  PO daily  Monitor CBC, INR, s/sx of bleeding  Makiyah Zentz J 05/19/2014,7:23 AM

## 2014-05-19 NOTE — Progress Notes (Signed)
Patient ID: Joshua [name], male   DOB: 1940/10/09, 73 y.o.   MRN: [id number]    Patient Name: Joshua [name] Date of Encounter: 05/19/2014    SUBJECTIVE:Patient feels well this morning.  No chest pain or shortness of breath.   Just got tikosyn so ECG won't be until about 11:30  Admitted 05-16-14 for Tikosyn loading.    Labs, QTc stable.   TELEMETRY: NSR this am but ECG this am with atrial flutter   Filed Vitals:   05/18/14 1351 05/18/14 2047 05/18/14 2123 05/19/14 0558  BP: 131/81 139/68 137/84 138/90  Pulse: 65 87 88 105  Temp: 98.3 F (36.8 C) 98.4 F (36.9 C)  98.2 F (36.8 C)  TempSrc: Oral Oral  Oral  Resp: 18 18  18   Height:      Weight:    197 lb 5 oz (89.5 kg)  SpO2: 97% 94%  100%    Intake/Output Summary (Last 24 hours) at 05/19/14 9798 Last data filed at 05/19/14 0817  Gross per 24 hour  Intake    600 ml  Output      0 ml  Net    600 ml    CURRENT MEDICATIONS: . dofetilide  500 mcg Oral BID  . gabapentin  300 mg Oral BID  . gabapentin  600 mg Oral QHS  . metoprolol tartrate  25 mg Oral BID  . sodium chloride  3 mL Intravenous Q12H  . warfarin  7.5 mg Oral q1800  . Warfarin - Pharmacist Dosing Inpatient   Does not apply q1800    LABS: Basic Metabolic Panel:  Recent Labs  05/18/14 0545 05/19/14 0444  NA 138 140  K 4.3 4.0  CL 104 104  CO2 22 24  GLUCOSE 97 92  BUN 13 16  CREATININE 1.13 1.16  CALCIUM 8.9 9.0  MG 2.1 2.0    PHYSICAL EXAM Well appearing 73 yo man, NAD HEENT: Unremarkable,[place], AT Neck:  6 JVD, no thyromegally Back:  No CVA tenderness Lungs:  Clear with no wheezes, rales, or rhonchi HEART:  Regular rate rhythm, no murmurs, no rubs, no clicks Abd:  soft, positive bowel sounds, no organomegally, no rebound, no guarding Ext:  2 plus pulses, no edema, no cyanosis, no clubbing Skin:  No rashes no nodules Neuro:  CN II through XII intact, motor grossly intact    Active Problems:   Atrial fibrillation   A-fib   A/P 1. Atrial  fib with an RVR 2. Symptomatic bradycardia, s/p PPM 3. Admit for tikosyn  PAF:  Tikosyn load  QT ok  D/C today after ECG if QT ok  Discussed with PA Jannet Mantis has been stable Pacer reprogrammed

## 2014-05-21 NOTE — H&P (Signed)
Agree with above.  Mikle Bosworth.D.

## 2014-05-22 ENCOUNTER — Encounter: Payer: Self-pay | Admitting: Internal Medicine

## 2014-05-27 ENCOUNTER — Ambulatory Visit (INDEPENDENT_AMBULATORY_CARE_PROVIDER_SITE_OTHER): Payer: Medicare HMO | Admitting: Pharmacist

## 2014-05-27 DIAGNOSIS — I4891 Unspecified atrial fibrillation: Secondary | ICD-10-CM

## 2014-05-27 LAB — BASIC METABOLIC PANEL
BUN: 14 mg/dL (ref 6–23)
CALCIUM: 8.9 mg/dL (ref 8.4–10.5)
CO2: 28 meq/L (ref 19–32)
CREATININE: 1.4 mg/dL (ref 0.4–1.5)
Chloride: 104 mEq/L (ref 96–112)
GFR: 54.47 mL/min — AB (ref >60.00)
Glucose, Bld: 104 mg/dL — ABNORMAL HIGH (ref 70–99)
Potassium: 4.6 mEq/L (ref 3.5–5.1)
SODIUM: 141 meq/L (ref 135–145)

## 2014-05-27 LAB — MAGNESIUM: Magnesium: 2.3 mg/dL (ref 1.5–2.5)

## 2014-05-27 NOTE — Progress Notes (Signed)
Patient is a 73 year old WM presenting today for Tikosyn follow up post hospital discharge. Patient was seen by Dr. Lovena Le on 05/14/14.  The patient has a history of paroxysmal atrial fibrillation which has been well controlled with Rythmol until 3 weeks ago. Dr. Lovena Le discussed treatment options with patient, and he elected to proceed with Tikosyn initiation.    On 8/20, patient was started on Tikosyn 500 mcg BID in the hospital. He converted to normal sinus rhythm and was therapeutically anticoagulated on home dose of Coumadin. QTc remained stable and patient was discharged on 8/24.   Since discharge, patient states that he has more spurts of energy and a few occasions where he experiences less energy. Overall, he is feeling much better. He has had no trouble picking up his Tikosyn from the pharmacy.  His EKG was reviewed today by Dr. Rayann Heman. QTc remained stable since discharge at 437 msec, patient in NSR with ventricular rate of 91 bpm.   Current Outpatient Prescriptions  Medication Sig Dispense Refill  . dofetilide (TIKOSYN) 500 MCG capsule Take 1 capsule (500 mcg total) by mouth 2 (two) times daily.  60 capsule  10  . gabapentin (NEURONTIN) 300 MG capsule Take 300 mg by mouth 3 (three) times daily.       . metoprolol tartrate (LOPRESSOR) 25 MG tablet Take 25 mg by mouth 2 (two) times daily.      . tadalafil (CIALIS) 5 MG tablet Take 5 mg by mouth daily as needed. Take for urine function      . testosterone cypionate (DEPOTESTOTERONE CYPIONATE) 200 MG/ML injection Inject 100 mg into the muscle every 14 (fourteen) days.      Marland Kitchen warfarin (COUMADIN) 5 MG tablet Take 2.5-5 mg by mouth daily. Take total of 7.5mg  every day patient splits the 5mg  and splits it in half to make total of 7.5mg        No current facility-administered medications for this visit.   Allergies  Allergen Reactions  . Codeine     Vomiting   . Contrast Media [Iodinated Diagnostic Agents]     Hives   . Sulfonamide  Derivatives     Breathing trouble

## 2014-06-03 ENCOUNTER — Encounter: Payer: Self-pay | Admitting: Internal Medicine

## 2014-06-04 ENCOUNTER — Ambulatory Visit (INDEPENDENT_AMBULATORY_CARE_PROVIDER_SITE_OTHER): Payer: Medicare HMO | Admitting: *Deleted

## 2014-06-04 DIAGNOSIS — G459 Transient cerebral ischemic attack, unspecified: Secondary | ICD-10-CM

## 2014-06-04 DIAGNOSIS — I4891 Unspecified atrial fibrillation: Secondary | ICD-10-CM

## 2014-06-04 DIAGNOSIS — Z7901 Long term (current) use of anticoagulants: Secondary | ICD-10-CM

## 2014-06-04 DIAGNOSIS — Z5181 Encounter for therapeutic drug level monitoring: Secondary | ICD-10-CM

## 2014-06-04 LAB — POCT INR: INR: 1.8

## 2014-06-06 ENCOUNTER — Other Ambulatory Visit: Payer: Self-pay | Admitting: Internal Medicine

## 2014-06-21 ENCOUNTER — Ambulatory Visit (INDEPENDENT_AMBULATORY_CARE_PROVIDER_SITE_OTHER): Payer: Medicare HMO | Admitting: *Deleted

## 2014-06-21 DIAGNOSIS — Z7901 Long term (current) use of anticoagulants: Secondary | ICD-10-CM

## 2014-06-21 DIAGNOSIS — Z5181 Encounter for therapeutic drug level monitoring: Secondary | ICD-10-CM

## 2014-06-21 DIAGNOSIS — I4891 Unspecified atrial fibrillation: Secondary | ICD-10-CM

## 2014-06-21 DIAGNOSIS — G459 Transient cerebral ischemic attack, unspecified: Secondary | ICD-10-CM

## 2014-06-21 LAB — POCT INR: INR: 2.1

## 2014-06-24 ENCOUNTER — Other Ambulatory Visit: Payer: Self-pay

## 2014-06-25 ENCOUNTER — Ambulatory Visit (INDEPENDENT_AMBULATORY_CARE_PROVIDER_SITE_OTHER): Payer: Medicare HMO | Admitting: Internal Medicine

## 2014-06-25 ENCOUNTER — Encounter: Payer: Self-pay | Admitting: Internal Medicine

## 2014-06-25 ENCOUNTER — Other Ambulatory Visit: Payer: Self-pay | Admitting: *Deleted

## 2014-06-25 VITALS — BP 118/70 | HR 89 | Ht 70.0 in | Wt 200.4 lb

## 2014-06-25 DIAGNOSIS — I480 Paroxysmal atrial fibrillation: Secondary | ICD-10-CM

## 2014-06-25 DIAGNOSIS — I10 Essential (primary) hypertension: Secondary | ICD-10-CM

## 2014-06-25 DIAGNOSIS — I4891 Unspecified atrial fibrillation: Secondary | ICD-10-CM

## 2014-06-25 LAB — MDC_IDC_ENUM_SESS_TYPE_INCLINIC
Brady Statistic RA Percent Paced: 74 %
Brady Statistic RV Percent Paced: 3.3 %
Date Time Interrogation Session: 20150929131218
Implantable Pulse Generator Model: 2210
Implantable Pulse Generator Serial Number: 7196227
Lead Channel Impedance Value: 412.5 Ohm
Lead Channel Impedance Value: 425 Ohm
Lead Channel Pacing Threshold Amplitude: 0.625 V
Lead Channel Pacing Threshold Pulse Width: 0.4 ms
Lead Channel Pacing Threshold Pulse Width: 0.4 ms
Lead Channel Sensing Intrinsic Amplitude: 12 mV
Lead Channel Setting Pacing Amplitude: 1.625
Lead Channel Setting Sensing Sensitivity: 2 mV
MDC IDC MSMT BATTERY REMAINING LONGEVITY: 108 mo
MDC IDC MSMT BATTERY VOLTAGE: 2.95 V
MDC IDC MSMT LEADCHNL RA SENSING INTR AMPL: 3.4 mV
MDC IDC MSMT LEADCHNL RV PACING THRESHOLD AMPLITUDE: 1 V
MDC IDC SET LEADCHNL RV PACING AMPLITUDE: 1.25 V
MDC IDC SET LEADCHNL RV PACING PULSEWIDTH: 0.4 ms

## 2014-06-25 MED ORDER — WARFARIN SODIUM 5 MG PO TABS: ORAL_TABLET | ORAL | Status: DC

## 2014-06-25 NOTE — Assessment & Plan Note (Signed)
His atrial fib is under much better control. He will continue Tikosyn.

## 2014-06-25 NOTE — Assessment & Plan Note (Signed)
Labs remained stable since discharge.  Will continue current therapy and follow up with MD as previously scheduled.

## 2014-06-25 NOTE — Progress Notes (Signed)
HPI Joshua [name] returns today for followup. He is a 73 yo man with a history of symptomatic bradycardia, status post permanent pacemaker insertion. The patient has a history of paroxysmal atrial fibrillation which has been well-controlled with Rythmol until 2 months ago. He has was admitted and started on Tikosyn. No syncope. He does not have palpitations. He is overall better.  Allergies  Allergen Reactions  . Sulfonamide Derivatives Other (See Comments)    Breathing trouble   . Codeine Nausea And Vomiting  . Contrast Media [Iodinated Diagnostic Agents] Hives     Current Outpatient Prescriptions  Medication Sig Dispense Refill  . dofetilide (TIKOSYN) 500 MCG capsule Take 1 capsule (500 mcg total) by mouth 2 (two) times daily.  60 capsule  10  . gabapentin (NEURONTIN) 300 MG capsule Take 300 mg by mouth 3 (three) times daily.       . metoprolol tartrate (LOPRESSOR) 25 MG tablet Take 25 mg by mouth 2 (two) times daily.      . tadalafil (CIALIS) 5 MG tablet Take 5 mg by mouth daily as needed. Take for urine function      . testosterone cypionate (DEPOTESTOTERONE CYPIONATE) 200 MG/ML injection Inject 200 mg into the muscle every 14 (fourteen) days.       Marland Kitchen warfarin (COUMADIN) 5 MG tablet Take 1 1/2 tablets daily except 2 tablets on Wednesdays and Saturdays  60 tablet  6   No current facility-administered medications for this visit.     Past Medical History  Diagnosis Date  . Essential hypertension, benign   . ERYTHROCYTOSIS   . AF (paroxysmal atrial fibrillation)   . SICK SINUS SYNDROME   . DIZZINESS   . Other malaise and fatigue   . Painful respiration   . NUMBNESS   . CHEST PAIN UNSPECIFIED   . TIA   . Melanoma   . Depression   . Anxiety   . Visual loss     intermittent  . Complication of anesthesia   . PONV (postoperative nausea and vomiting)   . Pacemaker ? 2012  . GERD (gastroesophageal reflux disease)   . Dysrhythmia     hx of atrial fibrilation    ROS:   All  systems reviewed and negative except as noted in the HPI.   Past Surgical History  Procedure Laterality Date  . Pacemaker insertion    . Insert / replace / remove pacemaker    . Cholecystectomy    . Vasectomy    . Lymph node biopsy Left   . Skin cancer removal    . Colonoscopy  2013     Family History  Problem Relation Age of Onset  . Prostate cancer Father      History   Social History  . Marital Status: Married    Spouse Name: N/A    Number of Children: N/A  . Years of Education: N/A   Occupational History  . Not on file.   Social History Main Topics  . Smoking status: Former Research scientist (life sciences)  . Smokeless tobacco: Never Used     Comment: quit in 1976  . Alcohol Use: No  . Drug Use: No  . Sexual Activity: Not on file   Other Topics Concern  . Not on file   Social History Narrative  . No narrative on file     BP 118/70  Pulse 89  Ht 5\' 10"  (1.778 m)  Wt 200 lb 6.4 oz (90.901 kg)  BMI 28.75 kg/m2  Physical Exam:  Well appearing 73 yo man, NAD HEENT: Unremarkable Neck: 7 cm JVD, no thyromegally Back:  No CVA tenderness Lungs:  Clear with no wheezes, rales, or rhonchi. HEART:  IRegular rate rhythm, no murmurs, no rubs, no clicks Abd:  soft, positive bowel sounds, no organomegally, no rebound, no guarding Ext:  2 plus pulses, no edema, no cyanosis, no clubbing Skin:  No rashes no nodules Neuro:  CN II through XII intact, motor grossly intact   DEVICE  Normal device function.  See PaceArt for details.   Assess/Plan:

## 2014-06-25 NOTE — Assessment & Plan Note (Signed)
His blood pressure is well controlled. He will continue his current meds. 

## 2014-06-25 NOTE — Patient Instructions (Signed)
Your physician wants you to follow-up in: 3 months with Dr. Taylor. You will receive a reminder letter in the mail two months in advance. If you don't receive a letter, please call our office to schedule the follow-up appointment. 

## 2014-06-28 ENCOUNTER — Encounter: Payer: Self-pay | Admitting: Internal Medicine

## 2014-07-18 ENCOUNTER — Ambulatory Visit (INDEPENDENT_AMBULATORY_CARE_PROVIDER_SITE_OTHER): Payer: Medicare HMO | Admitting: *Deleted

## 2014-07-18 DIAGNOSIS — Z7901 Long term (current) use of anticoagulants: Secondary | ICD-10-CM

## 2014-07-18 DIAGNOSIS — I4891 Unspecified atrial fibrillation: Secondary | ICD-10-CM

## 2014-07-18 DIAGNOSIS — Z5181 Encounter for therapeutic drug level monitoring: Secondary | ICD-10-CM

## 2014-07-18 DIAGNOSIS — G459 Transient cerebral ischemic attack, unspecified: Secondary | ICD-10-CM

## 2014-07-18 LAB — POCT INR: INR: 2.8

## 2014-08-01 NOTE — Discharge Summary (Signed)
Physician Discharge Summary      Patient ID: Joshua [name] MRN: [id number] DOB/AGE: [birth year] 73 y.o.  Admit date: 05/16/2014 Discharge date: 08/01/2014  Admission Diagnoses:  Atrial Fib   Discharge Diagnoses:  Active Problems:   Atrial fibrillation    Discharged Condition: stable  Hospital Course:   The patient is a 73 yo man with PAF, symptomatic bradycardia, anxiety and depression. He has had progressive worsening of his atrial fibrillation and he has had a rapid ventricular response. He has continued to take his rhythmol but this medication has become ineffective. He presented for initiation of Tikosyn. He has not had syncope. He is s/p PPM insertion.   Mag and potassium were checked prior to starting and were 2.1 and 4.3 respectively.  QTc remained stable on Tikosyn, 566mcg BID.  Pacer rep interrogated device and reprogram to prevent inappropriate tracking of atrial flutter.   The patient was seen by Dr. Johnsie Cancel who felt he was stable for DC home.   Consults: pharmacy  Significant Diagnostic Studies: Serial EKGs  Treatments: Tikosyn start  Discharge Exam: Blood pressure 138/90, pulse 105, temperature 98.2 F (36.8 C), temperature source Oral, resp. rate 18, height 5\' 10"  (1.778 m), weight 197 lb 5 oz (89.5 kg), SpO2 100 %.   Disposition: 01-Home or Self Care      Discharge Instructions    Diet - low sodium heart healthy    Complete by:  As directed      Increase activity slowly    Complete by:  As directed             Medication List    STOP taking these medications        warfarin 5 MG tablet  Commonly known as:  COUMADIN      TAKE these medications        dofetilide 500 MCG capsule  Commonly known as:  TIKOSYN  Take 1 capsule (500 mcg total) by mouth 2 (two) times daily.     gabapentin 300 MG capsule  Commonly known as:  NEURONTIN  Take 300 mg by mouth 3 (three) times daily.     metoprolol tartrate 25 MG tablet  Commonly known as:  LOPRESSOR    Take 25 mg by mouth 2 (two) times daily.     tadalafil 5 MG tablet  Commonly known as:  CIALIS  Take 5 mg by mouth daily as needed. Take for urine function     testosterone cypionate 200 MG/ML injection  Commonly known as:  DEPOTESTOTERONE CYPIONATE  Inject 200 mg into the muscle every 14 (fourteen) days.         SignedTarri Fuller, [place] 08/01/2014, 1:09 PM

## 2014-08-15 ENCOUNTER — Ambulatory Visit (INDEPENDENT_AMBULATORY_CARE_PROVIDER_SITE_OTHER): Payer: Medicare HMO | Admitting: *Deleted

## 2014-08-15 DIAGNOSIS — G459 Transient cerebral ischemic attack, unspecified: Secondary | ICD-10-CM

## 2014-08-15 DIAGNOSIS — Z5181 Encounter for therapeutic drug level monitoring: Secondary | ICD-10-CM

## 2014-08-15 DIAGNOSIS — Z7901 Long term (current) use of anticoagulants: Secondary | ICD-10-CM

## 2014-08-15 DIAGNOSIS — I4891 Unspecified atrial fibrillation: Secondary | ICD-10-CM

## 2014-08-15 LAB — POCT INR: INR: 3.1

## 2014-09-10 ENCOUNTER — Ambulatory Visit (INDEPENDENT_AMBULATORY_CARE_PROVIDER_SITE_OTHER): Payer: Medicare HMO | Admitting: Internal Medicine

## 2014-09-10 ENCOUNTER — Encounter: Payer: Self-pay | Admitting: Internal Medicine

## 2014-09-10 VITALS — BP 132/78 | HR 64 | Ht 70.0 in | Wt 200.4 lb

## 2014-09-10 DIAGNOSIS — I495 Sick sinus syndrome: Secondary | ICD-10-CM

## 2014-09-10 DIAGNOSIS — I4891 Unspecified atrial fibrillation: Secondary | ICD-10-CM

## 2014-09-10 DIAGNOSIS — R079 Chest pain, unspecified: Secondary | ICD-10-CM

## 2014-09-10 DIAGNOSIS — R0789 Other chest pain: Secondary | ICD-10-CM

## 2014-09-10 DIAGNOSIS — I480 Paroxysmal atrial fibrillation: Secondary | ICD-10-CM

## 2014-09-10 DIAGNOSIS — Z950 Presence of cardiac pacemaker: Secondary | ICD-10-CM

## 2014-09-10 LAB — MDC_IDC_ENUM_SESS_TYPE_INCLINIC
Battery Remaining Longevity: 109.2 mo
Battery Voltage: 2.95 V
Brady Statistic RA Percent Paced: 76 %
Date Time Interrogation Session: 20151215155441
Implantable Pulse Generator Serial Number: 7196227
Lead Channel Impedance Value: 375 Ohm
Lead Channel Impedance Value: 412.5 Ohm
Lead Channel Pacing Threshold Amplitude: 1 V
Lead Channel Pacing Threshold Pulse Width: 0.4 ms
Lead Channel Setting Pacing Amplitude: 1.375
Lead Channel Setting Pacing Amplitude: 1.625
Lead Channel Setting Pacing Pulse Width: 0.4 ms
Lead Channel Setting Sensing Sensitivity: 2 mV
MDC IDC MSMT LEADCHNL RA PACING THRESHOLD AMPLITUDE: 0.75 V
MDC IDC MSMT LEADCHNL RA PACING THRESHOLD PULSEWIDTH: 0.4 ms
MDC IDC MSMT LEADCHNL RA SENSING INTR AMPL: 2.9 mV
MDC IDC MSMT LEADCHNL RV SENSING INTR AMPL: 12 mV
MDC IDC STAT BRADY RV PERCENT PACED: 3.2 %

## 2014-09-10 NOTE — Assessment & Plan Note (Signed)
Etiology of his chest pain is likely GI as it is non-exertional and improved with Zantac. Will follow.

## 2014-09-10 NOTE — Patient Instructions (Signed)
Your physician wants you to follow-up in: 6 months with Dr. Knox Saliva will receive a reminder letter in the mail two months in advance. If you don't receive a letter, please call our office to schedule the follow-up appointment.  Remote monitoring is used to monitor your Pacemaker or ICD from home. This monitoring reduces the number of office visits required to check your device to one time per year. It allows [place] to keep an eye on the functioning of your device to ensure it is working properly. You are scheduled for a device check from home on 12/10/14. You may send your transmission at any time that day. If you have a wireless device, the transmission will be sent automatically. After your physician reviews your transmission, you will receive a postcard with your next transmission date.

## 2014-09-10 NOTE — Assessment & Plan Note (Signed)
His [employer] DDD PM is working normally. Will recheck in several months. 

## 2014-09-10 NOTE — Progress Notes (Signed)
HPI Joshua [name] returns today for followup. He is a pleasant 73yo man with a h/o PAF and symptomatic tachybrady syndrome who underwent initiation of Tikosyn therapy approx. 3 months ago. In the interim he has done well. He exercises daily and walks over 10000 steps. He has had occaisional breakthroughs of atrial fibrillation. He has not had syncope. He had a single episode of chest pressure for which he sought medical attention. No sob or edema. Allergies  Allergen Reactions  . Sulfonamide Derivatives Other (See Comments)    Breathing trouble   . Codeine Nausea And Vomiting  . Contrast Media [Iodinated Diagnostic Agents] Hives     Current Outpatient Prescriptions  Medication Sig Dispense Refill  . dofetilide (TIKOSYN) 500 MCG capsule Take 1 capsule (500 mcg total) by mouth 2 (two) times daily. 60 capsule 10  . gabapentin (NEURONTIN) 300 MG capsule Take 300 mg by mouth 3 (three) times daily.     . metoprolol tartrate (LOPRESSOR) 25 MG tablet Take 25 mg by mouth 2 (two) times daily.    . ranitidine (ZANTAC) 300 MG tablet Take 1 tablet by mouth daily as needed. Acid reflux or heartburn    . tadalafil (CIALIS) 5 MG tablet Take 5 mg by mouth daily as needed. Take for urine function    . testosterone cypionate (DEPOTESTOTERONE CYPIONATE) 200 MG/ML injection Inject 200 mg into the muscle every 14 (fourteen) days.     Marland Kitchen warfarin (COUMADIN) 5 MG tablet Take 1 1/2 tablets daily except 2 tablets on Wednesdays and Saturdays 60 tablet 6   No current facility-administered medications for this visit.     Past Medical History  Diagnosis Date  . Essential hypertension, benign   . ERYTHROCYTOSIS   . AF (paroxysmal atrial fibrillation)   . SICK SINUS SYNDROME   . DIZZINESS   . Other malaise and fatigue   . Painful respiration   . NUMBNESS   . CHEST PAIN UNSPECIFIED   . TIA   . Melanoma   . Depression   . Anxiety   . Visual loss     intermittent  . Complication of anesthesia   . PONV  (postoperative nausea and vomiting)   . Pacemaker ? 2012  . GERD (gastroesophageal reflux disease)   . Dysrhythmia     hx of atrial fibrilation    ROS:   All systems reviewed and negative except as noted in the HPI.   Past Surgical History  Procedure Laterality Date  . Pacemaker insertion    . Insert / replace / remove pacemaker    . Cholecystectomy    . Vasectomy    . Lymph node biopsy Left   . Skin cancer removal    . Colonoscopy  2013     Family History  Problem Relation Age of Onset  . Prostate cancer Father      History   Social History  . Marital Status: Married    Spouse Name: N/A    Number of Children: N/A  . Years of Education: N/A   Occupational History  . Not on file.   Social History Main Topics  . Smoking status: Former Research scientist (life sciences)  . Smokeless tobacco: Never Used     Comment: quit in 1976  . Alcohol Use: No  . Drug Use: No  . Sexual Activity: Not on file   Other Topics Concern  . Not on file   Social History Narrative     BP 132/78 mmHg  Pulse 64  Ht 5\' 10"  (1.778 m)  Wt 200 lb 6.4 oz (90.901 kg)  BMI 28.75 kg/m2  Physical Exam:  Well appearing 73 yo man, NAD HEENT: Unremarkable Neck:  No JVD, no thyromegally Back:  No CVA tenderness Lungs:  Clear with no wheezes HEART:  Regular rate rhythm, no murmurs, no rubs, no clicks Abd:  soft, positive bowel sounds, no organomegally, no rebound, no guarding Ext:  2 plus pulses, no edema, no cyanosis, no clubbing Skin:  No rashes no nodules Neuro:  CN II through XII intact, motor grossly intact   DEVICE  Normal device function.  See PaceArt for details.   Assess/Plan:

## 2014-09-10 NOTE — Assessment & Plan Note (Signed)
He is maintaining NSR approximately 97% of the time and feels well. He will continue his Tikosyn.

## 2014-09-12 ENCOUNTER — Ambulatory Visit (INDEPENDENT_AMBULATORY_CARE_PROVIDER_SITE_OTHER): Payer: Medicare HMO | Admitting: *Deleted

## 2014-09-12 DIAGNOSIS — Z7901 Long term (current) use of anticoagulants: Secondary | ICD-10-CM

## 2014-09-12 DIAGNOSIS — Z5181 Encounter for therapeutic drug level monitoring: Secondary | ICD-10-CM

## 2014-09-12 DIAGNOSIS — I4891 Unspecified atrial fibrillation: Secondary | ICD-10-CM

## 2014-09-12 DIAGNOSIS — G459 Transient cerebral ischemic attack, unspecified: Secondary | ICD-10-CM

## 2014-09-12 LAB — POCT INR: INR: 2.9

## 2014-10-01 ENCOUNTER — Other Ambulatory Visit: Payer: Self-pay | Admitting: Internal Medicine

## 2014-10-02 ENCOUNTER — Other Ambulatory Visit: Payer: Self-pay

## 2014-10-02 MED ORDER — METOPROLOL TARTRATE 25 MG PO TABS: 25.0000 mg | ORAL_TABLET | Freq: Two times a day (BID) | ORAL | Status: DC

## 2014-10-24 ENCOUNTER — Ambulatory Visit (INDEPENDENT_AMBULATORY_CARE_PROVIDER_SITE_OTHER): Payer: Medicare HMO | Admitting: *Deleted

## 2014-10-24 DIAGNOSIS — I4891 Unspecified atrial fibrillation: Secondary | ICD-10-CM

## 2014-10-24 DIAGNOSIS — G459 Transient cerebral ischemic attack, unspecified: Secondary | ICD-10-CM

## 2014-10-24 DIAGNOSIS — Z7901 Long term (current) use of anticoagulants: Secondary | ICD-10-CM

## 2014-10-24 DIAGNOSIS — Z5181 Encounter for therapeutic drug level monitoring: Secondary | ICD-10-CM

## 2014-10-24 LAB — POCT INR: INR: 2.6

## 2014-12-05 ENCOUNTER — Ambulatory Visit (INDEPENDENT_AMBULATORY_CARE_PROVIDER_SITE_OTHER): Payer: Medicare PPO | Admitting: *Deleted

## 2014-12-05 DIAGNOSIS — G459 Transient cerebral ischemic attack, unspecified: Secondary | ICD-10-CM

## 2014-12-05 DIAGNOSIS — I4891 Unspecified atrial fibrillation: Secondary | ICD-10-CM

## 2014-12-05 DIAGNOSIS — Z5181 Encounter for therapeutic drug level monitoring: Secondary | ICD-10-CM

## 2014-12-05 DIAGNOSIS — Z7901 Long term (current) use of anticoagulants: Secondary | ICD-10-CM

## 2014-12-05 DIAGNOSIS — I480 Paroxysmal atrial fibrillation: Secondary | ICD-10-CM

## 2014-12-05 LAB — POCT INR: INR: 2.4

## 2014-12-10 ENCOUNTER — Ambulatory Visit (INDEPENDENT_AMBULATORY_CARE_PROVIDER_SITE_OTHER): Payer: Medicare PPO | Admitting: *Deleted

## 2014-12-10 DIAGNOSIS — I495 Sick sinus syndrome: Secondary | ICD-10-CM

## 2014-12-11 LAB — MDC_IDC_ENUM_SESS_TYPE_REMOTE
Battery Remaining Percentage: 74 %
Brady Statistic AP VP Percent: 5.1 %
Brady Statistic AS VP Percent: 1 %
Brady Statistic AS VS Percent: 15 %
Brady Statistic RA Percent Paced: 84 %
Brady Statistic RV Percent Paced: 5.1 %
Date Time Interrogation Session: 20160315062756
Implantable Pulse Generator Serial Number: 7196227
Lead Channel Impedance Value: 400 Ohm
Lead Channel Impedance Value: 410 Ohm
Lead Channel Pacing Threshold Amplitude: 1 V
Lead Channel Pacing Threshold Pulse Width: 0.4 ms
Lead Channel Pacing Threshold Pulse Width: 0.4 ms
Lead Channel Sensing Intrinsic Amplitude: 12 mV
Lead Channel Setting Pacing Amplitude: 1.25 V
MDC IDC MSMT BATTERY REMAINING LONGEVITY: 89 mo
MDC IDC MSMT BATTERY VOLTAGE: 2.95 V
MDC IDC MSMT LEADCHNL RA PACING THRESHOLD AMPLITUDE: 0.75 V
MDC IDC MSMT LEADCHNL RA SENSING INTR AMPL: 3.9 mV
MDC IDC SET LEADCHNL RA PACING AMPLITUDE: 1.75 V
MDC IDC SET LEADCHNL RV PACING PULSEWIDTH: 0.4 ms
MDC IDC SET LEADCHNL RV SENSING SENSITIVITY: 2 mV
MDC IDC STAT BRADY AP VS PERCENT: 79 %

## 2014-12-11 NOTE — Progress Notes (Signed)
Remote pacemaker transmission.   

## 2014-12-19 ENCOUNTER — Encounter: Payer: Self-pay | Admitting: Cardiology

## 2014-12-26 ENCOUNTER — Encounter: Payer: Self-pay | Admitting: Internal Medicine

## 2015-01-02 ENCOUNTER — Encounter: Payer: Self-pay | Admitting: Cardiology

## 2015-01-21 ENCOUNTER — Ambulatory Visit (INDEPENDENT_AMBULATORY_CARE_PROVIDER_SITE_OTHER): Payer: Medicare PPO | Admitting: *Deleted

## 2015-01-21 DIAGNOSIS — I4891 Unspecified atrial fibrillation: Secondary | ICD-10-CM | POA: Diagnosis not present

## 2015-01-21 DIAGNOSIS — Z7901 Long term (current) use of anticoagulants: Secondary | ICD-10-CM

## 2015-01-21 DIAGNOSIS — Z5181 Encounter for therapeutic drug level monitoring: Secondary | ICD-10-CM

## 2015-01-21 DIAGNOSIS — G459 Transient cerebral ischemic attack, unspecified: Secondary | ICD-10-CM

## 2015-01-21 DIAGNOSIS — I480 Paroxysmal atrial fibrillation: Secondary | ICD-10-CM

## 2015-01-21 LAB — POCT INR: INR: 2.3

## 2015-01-28 ENCOUNTER — Other Ambulatory Visit: Payer: Self-pay | Admitting: Internal Medicine

## 2015-01-29 ENCOUNTER — Other Ambulatory Visit: Payer: Self-pay

## 2015-01-29 MED ORDER — METOPROLOL TARTRATE 25 MG PO TABS: 25.0000 mg | ORAL_TABLET | Freq: Two times a day (BID) | ORAL | Status: DC

## 2015-03-04 ENCOUNTER — Ambulatory Visit (INDEPENDENT_AMBULATORY_CARE_PROVIDER_SITE_OTHER): Payer: Medicare PPO | Admitting: *Deleted

## 2015-03-04 ENCOUNTER — Encounter: Payer: Self-pay | Admitting: Cardiovascular Disease

## 2015-03-04 DIAGNOSIS — I4891 Unspecified atrial fibrillation: Secondary | ICD-10-CM | POA: Diagnosis not present

## 2015-03-04 DIAGNOSIS — G459 Transient cerebral ischemic attack, unspecified: Secondary | ICD-10-CM

## 2015-03-04 DIAGNOSIS — Z7901 Long term (current) use of anticoagulants: Secondary | ICD-10-CM | POA: Diagnosis not present

## 2015-03-04 DIAGNOSIS — Z5181 Encounter for therapeutic drug level monitoring: Secondary | ICD-10-CM | POA: Diagnosis not present

## 2015-03-04 LAB — POCT INR: INR: 2.5

## 2015-03-10 ENCOUNTER — Other Ambulatory Visit: Payer: Self-pay | Admitting: Internal Medicine

## 2015-03-14 ENCOUNTER — Encounter: Payer: Self-pay | Admitting: Internal Medicine

## 2015-03-14 ENCOUNTER — Ambulatory Visit (INDEPENDENT_AMBULATORY_CARE_PROVIDER_SITE_OTHER): Payer: Medicare PPO | Admitting: Internal Medicine

## 2015-03-14 VITALS — BP 128/74 | HR 60 | Ht 70.0 in | Wt 201.8 lb

## 2015-03-14 DIAGNOSIS — I480 Paroxysmal atrial fibrillation: Secondary | ICD-10-CM | POA: Diagnosis not present

## 2015-03-14 DIAGNOSIS — I495 Sick sinus syndrome: Secondary | ICD-10-CM | POA: Diagnosis not present

## 2015-03-14 DIAGNOSIS — Z950 Presence of cardiac pacemaker: Secondary | ICD-10-CM

## 2015-03-14 DIAGNOSIS — I10 Essential (primary) hypertension: Secondary | ICD-10-CM

## 2015-03-14 LAB — CUP PACEART INCLINIC DEVICE CHECK
Battery Voltage: 2.93 V
Brady Statistic RA Percent Paced: 87 %
Brady Statistic RV Percent Paced: 6.2 %
Lead Channel Impedance Value: 400 Ohm
Lead Channel Pacing Threshold Amplitude: 0.75 V
Lead Channel Pacing Threshold Pulse Width: 0.4 ms
Lead Channel Sensing Intrinsic Amplitude: 2.6 mV
Lead Channel Setting Pacing Amplitude: 1.75 V
Lead Channel Setting Sensing Sensitivity: 2 mV
MDC IDC MSMT BATTERY REMAINING LONGEVITY: 94.8 mo
MDC IDC MSMT LEADCHNL RV IMPEDANCE VALUE: 400 Ohm
MDC IDC MSMT LEADCHNL RV PACING THRESHOLD AMPLITUDE: 1.125 V
MDC IDC MSMT LEADCHNL RV PACING THRESHOLD PULSEWIDTH: 0.4 ms
MDC IDC MSMT LEADCHNL RV SENSING INTR AMPL: 12 mV
MDC IDC SESS DTM: 20160617132356
MDC IDC SET LEADCHNL RV PACING AMPLITUDE: 1.375
MDC IDC SET LEADCHNL RV PACING PULSEWIDTH: 0.4 ms
Pulse Gen Model: 2210
Pulse Gen Serial Number: 7196227

## 2015-03-14 NOTE — Assessment & Plan Note (Signed)
He is maintaining NSR 99% of the time. He will continue Tikosyn. We discussed the use of the new generic Tikosyn and for now he would like to wait on this and use branded. I agree.

## 2015-03-14 NOTE — Assessment & Plan Note (Signed)
His [employer] DDD PM is working normally. Will recheck in several months. 

## 2015-03-14 NOTE — Patient Instructions (Signed)
Medication Instructions:  Your physician recommends that you continue on your current medications as directed. Please refer to the Current Medication list given to you today.   Labwork: None ordered  Testing/Procedures: None ordered  Follow-Up: Your physician wants you to follow-up in: 12 months with Dr Knox Saliva will receive a reminder letter in the mail two months in advance. If you don't receive a letter, please call our office to schedule the follow-up appointment.   Remote monitoring is used to monitor your Pacemaker or ICD from home. This monitoring reduces the number of office visits required to check your device to one time per year. It allows [place] to keep an eye on the functioning of your device to ensure it is working properly. You are scheduled for a device check from home on 06/16/15. You may send your transmission at any time that day. If you have a wireless device, the transmission will be sent automatically. After your physician reviews your transmission, you will receive a postcard with your next transmission date.    Any Other Special Instructions Will Be Listed Below (If Applicable).

## 2015-03-14 NOTE — Assessment & Plan Note (Signed)
His blood pressure is well controlled. He will continue his current meds. 

## 2015-03-14 NOTE — Progress Notes (Signed)
HPI [name] returns today for followup. He is a pleasant 74yo man with a h/o PAF and symptomatic tachybrady syndrome who underwent initiation of Tikosyn therapy approx. 15 months ago. In the interim he has done well. He exercises daily.  He has had occaisional breakthroughs of atrial fibrillation. He has not had syncope. He has rare palpitations. No sob or edema. Allergies  Allergen Reactions  . Sulfonamide Derivatives Other (See Comments)    Breathing trouble   . Codeine Nausea And Vomiting  . Contrast Media [Iodinated Diagnostic Agents] Hives     Current Outpatient Prescriptions  Medication Sig Dispense Refill  . dofetilide (TIKOSYN) 500 MCG capsule Take 1 capsule (500 mcg total) by mouth 2 (two) times daily. 60 capsule 10  . gabapentin (NEURONTIN) 300 MG capsule Take 300 mg by mouth 3 (three) times daily.     . metoprolol tartrate (LOPRESSOR) 25 MG tablet Take 1 tablet (25 mg total) by mouth 2 (two) times daily. 60 tablet 3  . ranitidine (ZANTAC) 300 MG tablet Take 1 tablet by mouth daily as needed. Acid reflux or heartburn    . tadalafil (CIALIS) 5 MG tablet Take 5 mg by mouth daily as needed. Take for urine function    . testosterone cypionate (DEPOTESTOTERONE CYPIONATE) 200 MG/ML injection Inject 10 mg into the muscle every 14 (fourteen) days.     Marland Kitchen warfarin (COUMADIN) 5 MG tablet TAKE 1 AND 1/2 TABLET BY MOUTH DAILY , EXCEPT ON WEDNESDAYS AND SATURDAYS TAKE 2 TABLETS 60 tablet 6   No current facility-administered medications for this visit.     Past Medical History  Diagnosis Date  . Essential hypertension, benign   . ERYTHROCYTOSIS   . AF (paroxysmal atrial fibrillation)   . SICK SINUS SYNDROME   . DIZZINESS   . Other malaise and fatigue   . Painful respiration   . NUMBNESS   . CHEST PAIN UNSPECIFIED   . TIA   . Melanoma   . Depression   . Anxiety   . Visual loss     intermittent  . Complication of anesthesia   . PONV (postoperative nausea and vomiting)    . Pacemaker ? 2012  . GERD (gastroesophageal reflux disease)   . Dysrhythmia     hx of atrial fibrilation    ROS:   All systems reviewed and negative except as noted in the HPI.   Past Surgical History  Procedure Laterality Date  . Pacemaker insertion    . Insert / replace / remove pacemaker    . Cholecystectomy    . Vasectomy    . Lymph node biopsy Left   . Skin cancer removal    . Colonoscopy  2013     Family History  Problem Relation Age of Onset  . Prostate cancer Father      History   Social History  . Marital Status: Married    Spouse Name: N/A  . Number of Children: N/A  . Years of Education: N/A   Occupational History  . Not on file.   Social History Main Topics  . Smoking status: Former Research scientist (life sciences)  . Smokeless tobacco: Never Used     Comment: quit in 1976  . Alcohol Use: No  . Drug Use: No  . Sexual Activity: Not on file   Other Topics Concern  . Not on file   Social History Narrative     BP 128/74 mmHg  Pulse 60  Ht 5\' 10"  (1.778 m)  Wt 201 lb 12.8 oz (91.536 kg)  BMI 28.96 kg/m2  Physical Exam:  Well appearing 74 yo man, NAD HEENT: Unremarkable Neck:  No JVD, no thyromegally Back:  No CVA tenderness Lungs:  Clear with no wheezes HEART:  Regular rate rhythm, no murmurs, no rubs, no clicks Abd:  soft, positive bowel sounds, no organomegally, no rebound, no guarding Ext:  2 plus pulses, no edema, no cyanosis, no clubbing Skin:  No rashes no nodules Neuro:  CN II through XII intact, motor grossly intact   DEVICE  Normal device function.  See PaceArt for details.   Assess/Plan:

## 2015-03-24 ENCOUNTER — Other Ambulatory Visit: Payer: Self-pay

## 2015-04-10 ENCOUNTER — Other Ambulatory Visit: Payer: Self-pay | Admitting: Physician Assistant

## 2015-04-11 ENCOUNTER — Other Ambulatory Visit: Payer: Self-pay

## 2015-04-11 MED ORDER — DOFETILIDE 500 MCG PO CAPS: 500.0000 ug | ORAL_CAPSULE | Freq: Two times a day (BID) | ORAL | Status: DC

## 2015-04-15 ENCOUNTER — Ambulatory Visit (INDEPENDENT_AMBULATORY_CARE_PROVIDER_SITE_OTHER): Payer: Medicare PPO | Admitting: *Deleted

## 2015-04-15 DIAGNOSIS — I4891 Unspecified atrial fibrillation: Secondary | ICD-10-CM

## 2015-04-15 DIAGNOSIS — Z5181 Encounter for therapeutic drug level monitoring: Secondary | ICD-10-CM

## 2015-04-15 DIAGNOSIS — G459 Transient cerebral ischemic attack, unspecified: Secondary | ICD-10-CM

## 2015-04-15 DIAGNOSIS — Z7901 Long term (current) use of anticoagulants: Secondary | ICD-10-CM

## 2015-04-15 LAB — POCT INR: INR: 2.5

## 2015-05-27 ENCOUNTER — Ambulatory Visit (INDEPENDENT_AMBULATORY_CARE_PROVIDER_SITE_OTHER): Payer: Medicare PPO | Admitting: *Deleted

## 2015-05-27 DIAGNOSIS — G459 Transient cerebral ischemic attack, unspecified: Secondary | ICD-10-CM | POA: Diagnosis not present

## 2015-05-27 DIAGNOSIS — I4891 Unspecified atrial fibrillation: Secondary | ICD-10-CM | POA: Diagnosis not present

## 2015-05-27 DIAGNOSIS — Z5181 Encounter for therapeutic drug level monitoring: Secondary | ICD-10-CM

## 2015-05-27 DIAGNOSIS — Z7901 Long term (current) use of anticoagulants: Secondary | ICD-10-CM | POA: Diagnosis not present

## 2015-05-27 LAB — POCT INR: INR: 2.3

## 2015-05-28 ENCOUNTER — Other Ambulatory Visit: Payer: Self-pay | Admitting: Internal Medicine

## 2015-06-16 ENCOUNTER — Ambulatory Visit (INDEPENDENT_AMBULATORY_CARE_PROVIDER_SITE_OTHER): Payer: Medicare PPO | Admitting: *Deleted

## 2015-06-16 ENCOUNTER — Encounter: Payer: Self-pay | Admitting: Internal Medicine

## 2015-06-16 DIAGNOSIS — I495 Sick sinus syndrome: Secondary | ICD-10-CM

## 2015-06-17 NOTE — Progress Notes (Signed)
Remote pacemaker transmission.   

## 2015-06-23 LAB — CUP PACEART REMOTE DEVICE CHECK
Battery Remaining Longevity: 86 mo
Battery Remaining Percentage: 73 %
Battery Voltage: 2.92 V
Brady Statistic AP VP Percent: 8.3 %
Brady Statistic AP VS Percent: 84 %
Brady Statistic AS VP Percent: 1 %
Brady Statistic AS VS Percent: 7.3 %
Brady Statistic RA Percent Paced: 91 %
Brady Statistic RV Percent Paced: 8.6 %
Date Time Interrogation Session: 20160919073027
Lead Channel Impedance Value: 350 Ohm
Lead Channel Impedance Value: 380 Ohm
Lead Channel Pacing Threshold Amplitude: 0.75 V
Lead Channel Pacing Threshold Amplitude: 1.125 V
Lead Channel Pacing Threshold Pulse Width: 0.4 ms
Lead Channel Pacing Threshold Pulse Width: 0.4 ms
Lead Channel Sensing Intrinsic Amplitude: 12 mV
Lead Channel Sensing Intrinsic Amplitude: 2.4 mV
Lead Channel Setting Pacing Amplitude: 1.375
Lead Channel Setting Pacing Amplitude: 1.75 V
Lead Channel Setting Pacing Pulse Width: 0.4 ms
Lead Channel Setting Sensing Sensitivity: 2 mV
Pulse Gen Model: 2210
Pulse Gen Serial Number: 7196227

## 2015-07-08 ENCOUNTER — Encounter: Payer: Self-pay | Admitting: Cardiology

## 2015-07-08 ENCOUNTER — Ambulatory Visit (INDEPENDENT_AMBULATORY_CARE_PROVIDER_SITE_OTHER): Payer: Medicare PPO | Admitting: *Deleted

## 2015-07-08 DIAGNOSIS — Z5181 Encounter for therapeutic drug level monitoring: Secondary | ICD-10-CM

## 2015-07-08 DIAGNOSIS — Z7901 Long term (current) use of anticoagulants: Secondary | ICD-10-CM | POA: Diagnosis not present

## 2015-07-08 DIAGNOSIS — G459 Transient cerebral ischemic attack, unspecified: Secondary | ICD-10-CM

## 2015-07-08 DIAGNOSIS — I481 Persistent atrial fibrillation: Secondary | ICD-10-CM

## 2015-07-08 DIAGNOSIS — I4819 Other persistent atrial fibrillation: Secondary | ICD-10-CM

## 2015-07-08 DIAGNOSIS — I4891 Unspecified atrial fibrillation: Secondary | ICD-10-CM | POA: Diagnosis not present

## 2015-07-08 LAB — POCT INR: INR: 2.6

## 2015-07-22 ENCOUNTER — Encounter: Payer: Self-pay | Admitting: Cardiology

## 2015-08-19 ENCOUNTER — Ambulatory Visit (INDEPENDENT_AMBULATORY_CARE_PROVIDER_SITE_OTHER): Payer: Medicare PPO | Admitting: *Deleted

## 2015-08-19 DIAGNOSIS — Z5181 Encounter for therapeutic drug level monitoring: Secondary | ICD-10-CM

## 2015-08-19 DIAGNOSIS — Z7901 Long term (current) use of anticoagulants: Secondary | ICD-10-CM

## 2015-08-19 DIAGNOSIS — I4891 Unspecified atrial fibrillation: Secondary | ICD-10-CM | POA: Diagnosis not present

## 2015-08-19 DIAGNOSIS — G459 Transient cerebral ischemic attack, unspecified: Secondary | ICD-10-CM

## 2015-08-19 LAB — POCT INR: INR: 2.8

## 2015-09-23 ENCOUNTER — Ambulatory Visit (INDEPENDENT_AMBULATORY_CARE_PROVIDER_SITE_OTHER): Payer: Medicare PPO | Admitting: *Deleted

## 2015-09-23 DIAGNOSIS — I495 Sick sinus syndrome: Secondary | ICD-10-CM | POA: Diagnosis not present

## 2015-09-24 NOTE — Progress Notes (Signed)
Remote pacemaker transmission.   

## 2015-09-30 ENCOUNTER — Ambulatory Visit (INDEPENDENT_AMBULATORY_CARE_PROVIDER_SITE_OTHER): Payer: Medicare Other | Admitting: *Deleted

## 2015-09-30 DIAGNOSIS — I4891 Unspecified atrial fibrillation: Secondary | ICD-10-CM | POA: Diagnosis not present

## 2015-09-30 DIAGNOSIS — Z5181 Encounter for therapeutic drug level monitoring: Secondary | ICD-10-CM | POA: Diagnosis not present

## 2015-09-30 DIAGNOSIS — Z7901 Long term (current) use of anticoagulants: Secondary | ICD-10-CM | POA: Diagnosis not present

## 2015-09-30 DIAGNOSIS — G459 Transient cerebral ischemic attack, unspecified: Secondary | ICD-10-CM | POA: Diagnosis not present

## 2015-09-30 DIAGNOSIS — I480 Paroxysmal atrial fibrillation: Secondary | ICD-10-CM

## 2015-09-30 LAB — POCT INR: INR: 2.1

## 2015-10-10 LAB — CUP PACEART REMOTE DEVICE CHECK
Battery Remaining Percentage: 73 %
Battery Voltage: 2.92 V
Brady Statistic RA Percent Paced: 91 %
Brady Statistic RV Percent Paced: 9.2 %
Date Time Interrogation Session: 20161227080611
Implantable Lead Implant Date: 20120109
Lead Channel Impedance Value: 360 Ohm
Lead Channel Impedance Value: 400 Ohm
Lead Channel Pacing Threshold Amplitude: 1.125 V
Lead Channel Sensing Intrinsic Amplitude: 12 mV
Lead Channel Sensing Intrinsic Amplitude: 2.7 mV
Lead Channel Setting Pacing Amplitude: 1.375
Lead Channel Setting Pacing Pulse Width: 0.4 ms
Lead Channel Setting Sensing Sensitivity: 2 mV
MDC IDC LEAD IMPLANT DT: 20120109
MDC IDC LEAD LOCATION: 753859
MDC IDC LEAD LOCATION: 753860
MDC IDC MSMT BATTERY REMAINING LONGEVITY: 85 mo
MDC IDC MSMT LEADCHNL RA PACING THRESHOLD AMPLITUDE: 0.75 V
MDC IDC MSMT LEADCHNL RA PACING THRESHOLD PULSEWIDTH: 0.4 ms
MDC IDC MSMT LEADCHNL RV PACING THRESHOLD PULSEWIDTH: 0.4 ms
MDC IDC SET LEADCHNL RA PACING AMPLITUDE: 1.75 V
MDC IDC STAT BRADY AP VP PERCENT: 8.9 %
MDC IDC STAT BRADY AP VS PERCENT: 85 %
MDC IDC STAT BRADY AS VP PERCENT: 1 %
MDC IDC STAT BRADY AS VS PERCENT: 6.2 %
Pulse Gen Model: 2210
Pulse Gen Serial Number: 7196227

## 2015-10-15 ENCOUNTER — Encounter: Payer: Self-pay | Admitting: Cardiology

## 2015-10-29 ENCOUNTER — Encounter: Payer: Self-pay | Admitting: Cardiology

## 2015-11-13 ENCOUNTER — Ambulatory Visit (INDEPENDENT_AMBULATORY_CARE_PROVIDER_SITE_OTHER): Payer: Medicare Other | Admitting: *Deleted

## 2015-11-13 DIAGNOSIS — Z7901 Long term (current) use of anticoagulants: Secondary | ICD-10-CM

## 2015-11-13 DIAGNOSIS — G459 Transient cerebral ischemic attack, unspecified: Secondary | ICD-10-CM | POA: Diagnosis not present

## 2015-11-13 DIAGNOSIS — I4891 Unspecified atrial fibrillation: Secondary | ICD-10-CM | POA: Diagnosis not present

## 2015-11-13 DIAGNOSIS — Z5181 Encounter for therapeutic drug level monitoring: Secondary | ICD-10-CM

## 2015-11-13 LAB — POCT INR: INR: 3.3

## 2015-11-21 ENCOUNTER — Other Ambulatory Visit: Payer: Self-pay | Admitting: Internal Medicine

## 2015-12-09 ENCOUNTER — Ambulatory Visit (INDEPENDENT_AMBULATORY_CARE_PROVIDER_SITE_OTHER): Payer: Medicare Other | Admitting: *Deleted

## 2015-12-09 DIAGNOSIS — Z5181 Encounter for therapeutic drug level monitoring: Secondary | ICD-10-CM

## 2015-12-09 DIAGNOSIS — Z7901 Long term (current) use of anticoagulants: Secondary | ICD-10-CM | POA: Diagnosis not present

## 2015-12-09 DIAGNOSIS — I4891 Unspecified atrial fibrillation: Secondary | ICD-10-CM

## 2015-12-09 DIAGNOSIS — G459 Transient cerebral ischemic attack, unspecified: Secondary | ICD-10-CM

## 2015-12-09 LAB — POCT INR: INR: 3.4

## 2015-12-23 ENCOUNTER — Ambulatory Visit (INDEPENDENT_AMBULATORY_CARE_PROVIDER_SITE_OTHER): Payer: Medicare Other | Admitting: *Deleted

## 2015-12-23 DIAGNOSIS — Z7901 Long term (current) use of anticoagulants: Secondary | ICD-10-CM | POA: Diagnosis not present

## 2015-12-23 DIAGNOSIS — I495 Sick sinus syndrome: Secondary | ICD-10-CM

## 2015-12-23 DIAGNOSIS — Z5181 Encounter for therapeutic drug level monitoring: Secondary | ICD-10-CM

## 2015-12-23 DIAGNOSIS — G459 Transient cerebral ischemic attack, unspecified: Secondary | ICD-10-CM | POA: Diagnosis not present

## 2015-12-23 DIAGNOSIS — I4891 Unspecified atrial fibrillation: Secondary | ICD-10-CM

## 2015-12-23 LAB — POCT INR: INR: 2.3

## 2015-12-23 NOTE — Progress Notes (Signed)
Remote pacemaker transmission.   

## 2016-01-20 ENCOUNTER — Ambulatory Visit (INDEPENDENT_AMBULATORY_CARE_PROVIDER_SITE_OTHER): Payer: Medicare Other | Admitting: *Deleted

## 2016-01-20 DIAGNOSIS — I4891 Unspecified atrial fibrillation: Secondary | ICD-10-CM | POA: Diagnosis not present

## 2016-01-20 DIAGNOSIS — Z5181 Encounter for therapeutic drug level monitoring: Secondary | ICD-10-CM | POA: Diagnosis not present

## 2016-01-20 DIAGNOSIS — G459 Transient cerebral ischemic attack, unspecified: Secondary | ICD-10-CM

## 2016-01-20 DIAGNOSIS — Z7901 Long term (current) use of anticoagulants: Secondary | ICD-10-CM

## 2016-01-20 LAB — POCT INR: INR: 2.3

## 2016-01-22 LAB — CUP PACEART REMOTE DEVICE CHECK
Battery Remaining Longevity: 87 mo
Battery Remaining Percentage: 73 %
Battery Voltage: 2.92 V
Brady Statistic AP VS Percent: 85 %
Brady Statistic AS VS Percent: 5.8 %
Date Time Interrogation Session: 20170328063904
Implantable Lead Implant Date: 20120109
Implantable Lead Location: 753860
Lead Channel Impedance Value: 410 Ohm
Lead Channel Pacing Threshold Amplitude: 1 V
Lead Channel Pacing Threshold Pulse Width: 0.4 ms
Lead Channel Setting Pacing Pulse Width: 0.4 ms
Lead Channel Setting Sensing Sensitivity: 2 mV
MDC IDC LEAD IMPLANT DT: 20120109
MDC IDC LEAD LOCATION: 753859
MDC IDC MSMT LEADCHNL RA PACING THRESHOLD AMPLITUDE: 0.75 V
MDC IDC MSMT LEADCHNL RA PACING THRESHOLD PULSEWIDTH: 0.4 ms
MDC IDC MSMT LEADCHNL RA SENSING INTR AMPL: 2.5 mV
MDC IDC MSMT LEADCHNL RV IMPEDANCE VALUE: 440 Ohm
MDC IDC MSMT LEADCHNL RV SENSING INTR AMPL: 12 mV
MDC IDC PG SERIAL: 7196227
MDC IDC SET LEADCHNL RA PACING AMPLITUDE: 1.75 V
MDC IDC SET LEADCHNL RV PACING AMPLITUDE: 1.25 V
MDC IDC STAT BRADY AP VP PERCENT: 8.9 %
MDC IDC STAT BRADY AS VP PERCENT: 1 %
MDC IDC STAT BRADY RA PERCENT PACED: 92 %
MDC IDC STAT BRADY RV PERCENT PACED: 9.2 %

## 2016-01-23 ENCOUNTER — Encounter: Payer: Self-pay | Admitting: Cardiology

## 2016-02-06 ENCOUNTER — Encounter: Payer: Self-pay | Admitting: Cardiology

## 2016-02-17 ENCOUNTER — Ambulatory Visit (INDEPENDENT_AMBULATORY_CARE_PROVIDER_SITE_OTHER): Payer: Medicare Other | Admitting: *Deleted

## 2016-02-17 DIAGNOSIS — Z5181 Encounter for therapeutic drug level monitoring: Secondary | ICD-10-CM | POA: Diagnosis not present

## 2016-02-17 DIAGNOSIS — I4891 Unspecified atrial fibrillation: Secondary | ICD-10-CM

## 2016-02-17 DIAGNOSIS — G459 Transient cerebral ischemic attack, unspecified: Secondary | ICD-10-CM

## 2016-02-17 DIAGNOSIS — Z7901 Long term (current) use of anticoagulants: Secondary | ICD-10-CM | POA: Diagnosis not present

## 2016-02-17 LAB — POCT INR: INR: 2.5

## 2016-03-12 ENCOUNTER — Other Ambulatory Visit: Payer: Self-pay | Admitting: Physician Assistant

## 2016-03-18 ENCOUNTER — Ambulatory Visit (INDEPENDENT_AMBULATORY_CARE_PROVIDER_SITE_OTHER): Payer: Medicare Other | Admitting: Internal Medicine

## 2016-03-18 ENCOUNTER — Encounter: Payer: Self-pay | Admitting: Internal Medicine

## 2016-03-18 VITALS — BP 116/78 | HR 95 | Ht 70.0 in | Wt 197.8 lb

## 2016-03-18 DIAGNOSIS — I495 Sick sinus syndrome: Secondary | ICD-10-CM

## 2016-03-18 NOTE — Progress Notes (Signed)
HPI [name] returns today for followup. He is a pleasant 75yo man with a h/o PAF and symptomatic tachybrady syndrome on Tikosyn. In the interim he has done well. He exercises daily.  He has had occaisional breakthroughs of atrial fibrillation. He has not had syncope. He has rare palpitations. No sob or edema. Allergies  Allergen Reactions  . Sulfonamide Derivatives Other (See Comments)    Breathing trouble   . Codeine Nausea And Vomiting  . Contrast Media [Iodinated Diagnostic Agents] Hives     Current Outpatient Prescriptions  Medication Sig Dispense Refill  . gabapentin (NEURONTIN) 300 MG capsule Take 300 mg by mouth 3 (three) times daily.     . metoprolol tartrate (LOPRESSOR) 25 MG tablet TAKE 1 TABLET BY MOUTH TWICE DAILY 60 tablet 10  . ranitidine (ZANTAC) 300 MG tablet Take 1 tablet by mouth daily as needed. Acid reflux or heartburn    . tadalafil (CIALIS) 5 MG tablet Take 5 mg by mouth daily as needed. Take for urine function    . testosterone cypionate (DEPOTESTOTERONE CYPIONATE) 200 MG/ML injection Inject 10 mg into the muscle every 14 (fourteen) days.     Marland Kitchen TIKOSYN 500 MCG capsule TAKE 1 CAPSULE BY MOUTH 2 TIMES DAILY. 60 capsule 0  . warfarin (COUMADIN) 5 MG tablet TAKE 1 AND 1/2 TABLET BY MOUTH DAILY , EXCEPT ON WEDNESDAYS AND SATURDAYS TAKE 2 TABLETS 60 tablet 6   No current facility-administered medications for this visit.     Past Medical History  Diagnosis Date  . Essential hypertension, benign   . ERYTHROCYTOSIS   . AF (paroxysmal atrial fibrillation) (Joshua [name])   . SICK SINUS SYNDROME   . DIZZINESS   . Other malaise and fatigue   . Painful respiration   . NUMBNESS   . CHEST PAIN UNSPECIFIED   . TIA   . Melanoma (Joshua [name])   . Depression   . Anxiety   . Visual loss     intermittent  . Complication of anesthesia   . PONV (postoperative nausea and vomiting)   . Pacemaker ? 2012  . GERD (gastroesophageal reflux disease)   . Dysrhythmia     hx of atrial  fibrilation    ROS:   All systems reviewed and negative except as noted in the HPI.   Past Surgical History  Procedure Laterality Date  . Pacemaker insertion    . Insert / replace / remove pacemaker    . Cholecystectomy    . Vasectomy    . Lymph node biopsy Left   . Skin cancer removal    . Colonoscopy  2013     Family History  Problem Relation Age of Onset  . Prostate cancer Father      Social History   Social History  . Marital Status: Married    Spouse Name: N/A  . Number of Children: N/A  . Years of Education: N/A   Occupational History  . Not on file.   Social History Main Topics  . Smoking status: Former Research scientist (life sciences)  . Smokeless tobacco: Never Used     Comment: quit in 1976  . Alcohol Use: No  . Drug Use: No  . Sexual Activity: Not on file   Other Topics Concern  . Not on file   Social History Narrative     BP 116/78 mmHg  Pulse 95  Ht 5\' 10"  (1.778 m)  Wt 197 lb 12.8 oz (89.721 kg)  BMI 28.38 kg/m2  Physical Exam:  Well appearing 75 yo man, NAD HEENT: Unremarkable Neck:  6 cm JVD, no thyromegally Back:  No CVA tenderness Lungs:  Clear with no wheezes HEART:  Regular rate rhythm, no murmurs, no rubs, no clicks Abd:  soft, positive bowel sounds, no organomegally, no rebound, no guarding Ext:  2 plus pulses, no edema, no cyanosis, no clubbing Skin:  No rashes no nodules Neuro:  CN II through XII intact, motor grossly intact  ECG - NSR with atrial pacing  DEVICE  Normal device function.  See PaceArt for details.   Assess/Plan: 1. PAF - he is maintaining NSR about 98% of the time. He will continue his Tikosyn 2. HTN - his blood pressure is well controlled. Will follow. 3. PPM - his St. Jude device is working normally. Will recheck in several months.  Joshua [name].D.

## 2016-03-18 NOTE — Patient Instructions (Addendum)
Medication Instructions:  Your physician recommends that you continue on your current medications as directed. Please refer to the Current Medication list given to you today.  Labwork: None ordered  Testing/Procedures: None ordered  Follow-Up: Your physician wants you to follow-up in: 1 year with Dr. Taylor. You will receive a reminder letter in the mail two months in advance. If you don't receive a letter, please call our office to schedule the follow-up appointment.  If you need a refill on your cardiac medications before your next appointment, please call your pharmacy.  Thank you for choosing [facility]!!        

## 2016-03-19 LAB — CUP PACEART INCLINIC DEVICE CHECK
Battery Voltage: 2.9 V
Brady Statistic RV Percent Paced: 9.5 %
Implantable Lead Implant Date: 20120109
Implantable Lead Location: 753860
Lead Channel Impedance Value: 362.5 Ohm
Lead Channel Impedance Value: 375 Ohm
Lead Channel Pacing Threshold Amplitude: 0.75 V
Lead Channel Pacing Threshold Pulse Width: 0.4 ms
Lead Channel Pacing Threshold Pulse Width: 0.4 ms
Lead Channel Setting Pacing Amplitude: 1.75 V
Lead Channel Setting Sensing Sensitivity: 2 mV
MDC IDC LEAD IMPLANT DT: 20120109
MDC IDC LEAD LOCATION: 753859
MDC IDC MSMT BATTERY REMAINING LONGEVITY: 73.2
MDC IDC MSMT LEADCHNL RA SENSING INTR AMPL: 2.8 mV
MDC IDC MSMT LEADCHNL RV PACING THRESHOLD AMPLITUDE: 1 V
MDC IDC MSMT LEADCHNL RV SENSING INTR AMPL: 12 mV
MDC IDC SESS DTM: 20170622174555
MDC IDC SET LEADCHNL RV PACING AMPLITUDE: 1.25 V
MDC IDC SET LEADCHNL RV PACING PULSEWIDTH: 0.4 ms
MDC IDC STAT BRADY RA PERCENT PACED: 92 %
Pulse Gen Serial Number: 7196227

## 2016-04-01 ENCOUNTER — Ambulatory Visit (INDEPENDENT_AMBULATORY_CARE_PROVIDER_SITE_OTHER): Payer: Medicare Other | Admitting: *Deleted

## 2016-04-01 DIAGNOSIS — Z7901 Long term (current) use of anticoagulants: Secondary | ICD-10-CM

## 2016-04-01 DIAGNOSIS — I4891 Unspecified atrial fibrillation: Secondary | ICD-10-CM | POA: Diagnosis not present

## 2016-04-01 DIAGNOSIS — G459 Transient cerebral ischemic attack, unspecified: Secondary | ICD-10-CM | POA: Diagnosis not present

## 2016-04-01 DIAGNOSIS — Z5181 Encounter for therapeutic drug level monitoring: Secondary | ICD-10-CM

## 2016-04-01 LAB — POCT INR: INR: 2.4

## 2016-04-09 ENCOUNTER — Other Ambulatory Visit: Payer: Self-pay | Admitting: *Deleted

## 2016-04-09 MED ORDER — DOFETILIDE 500 MCG PO CAPS: ORAL_CAPSULE | ORAL | Status: DC

## 2016-04-22 ENCOUNTER — Other Ambulatory Visit: Payer: Self-pay | Admitting: Internal Medicine

## 2016-05-04 ENCOUNTER — Ambulatory Visit (INDEPENDENT_AMBULATORY_CARE_PROVIDER_SITE_OTHER): Payer: Medicare Other | Admitting: *Deleted

## 2016-05-04 DIAGNOSIS — Z5181 Encounter for therapeutic drug level monitoring: Secondary | ICD-10-CM

## 2016-05-04 DIAGNOSIS — I4891 Unspecified atrial fibrillation: Secondary | ICD-10-CM

## 2016-05-04 DIAGNOSIS — Z7901 Long term (current) use of anticoagulants: Secondary | ICD-10-CM | POA: Diagnosis not present

## 2016-05-04 DIAGNOSIS — G459 Transient cerebral ischemic attack, unspecified: Secondary | ICD-10-CM

## 2016-05-04 LAB — POCT INR: INR: 2.1

## 2016-05-12 ENCOUNTER — Telehealth: Payer: Self-pay | Admitting: Cardiology

## 2016-05-12 NOTE — Telephone Encounter (Signed)
Attempted to call pt about home monitor not updating in at least 8 days. No answer and unable to leave a message b/c number was busy.

## 2016-06-15 ENCOUNTER — Ambulatory Visit (INDEPENDENT_AMBULATORY_CARE_PROVIDER_SITE_OTHER): Payer: Medicare Other | Admitting: *Deleted

## 2016-06-15 DIAGNOSIS — I4891 Unspecified atrial fibrillation: Secondary | ICD-10-CM | POA: Diagnosis not present

## 2016-06-15 DIAGNOSIS — Z5181 Encounter for therapeutic drug level monitoring: Secondary | ICD-10-CM

## 2016-06-15 DIAGNOSIS — G459 Transient cerebral ischemic attack, unspecified: Secondary | ICD-10-CM | POA: Diagnosis not present

## 2016-06-15 DIAGNOSIS — Z7901 Long term (current) use of anticoagulants: Secondary | ICD-10-CM

## 2016-06-15 LAB — POCT INR: INR: 2.1

## 2016-06-18 ENCOUNTER — Ambulatory Visit (INDEPENDENT_AMBULATORY_CARE_PROVIDER_SITE_OTHER): Payer: Medicare Other | Admitting: *Deleted

## 2016-06-18 DIAGNOSIS — I495 Sick sinus syndrome: Secondary | ICD-10-CM | POA: Diagnosis not present

## 2016-06-18 NOTE — Progress Notes (Signed)
Remote pacemaker transmission.   

## 2016-06-22 ENCOUNTER — Encounter: Payer: Self-pay | Admitting: Cardiology

## 2016-07-06 ENCOUNTER — Encounter: Payer: Self-pay | Admitting: Cardiology

## 2016-07-13 LAB — CUP PACEART REMOTE DEVICE CHECK
Battery Remaining Longevity: 87 mo
Battery Remaining Percentage: 73 %
Brady Statistic AP VP Percent: 8.8 %
Brady Statistic AS VS Percent: 5.5 %
Brady Statistic RA Percent Paced: 90 %
Brady Statistic RV Percent Paced: 9.6 %
Date Time Interrogation Session: 20170921074223
Implantable Lead Implant Date: 20120109
Implantable Lead Location: 753859
Lead Channel Impedance Value: 360 Ohm
Lead Channel Impedance Value: 400 Ohm
Lead Channel Pacing Threshold Pulse Width: 0.4 ms
Lead Channel Pacing Threshold Pulse Width: 0.4 ms
Lead Channel Setting Sensing Sensitivity: 3 mV
MDC IDC LEAD IMPLANT DT: 20120109
MDC IDC LEAD LOCATION: 753860
MDC IDC MSMT BATTERY VOLTAGE: 2.92 V
MDC IDC MSMT LEADCHNL RA PACING THRESHOLD AMPLITUDE: 0.625 V
MDC IDC MSMT LEADCHNL RA SENSING INTR AMPL: 3.5 mV
MDC IDC MSMT LEADCHNL RV PACING THRESHOLD AMPLITUDE: 1 V
MDC IDC MSMT LEADCHNL RV SENSING INTR AMPL: 12 mV
MDC IDC SET LEADCHNL RA PACING AMPLITUDE: 1.625
MDC IDC SET LEADCHNL RV PACING AMPLITUDE: 1.25 V
MDC IDC SET LEADCHNL RV PACING PULSEWIDTH: 0.4 ms
MDC IDC STAT BRADY AP VS PERCENT: 85 %
MDC IDC STAT BRADY AS VP PERCENT: 1 %
Pulse Gen Model: 2210
Pulse Gen Serial Number: 7196227

## 2016-07-27 ENCOUNTER — Ambulatory Visit (INDEPENDENT_AMBULATORY_CARE_PROVIDER_SITE_OTHER): Payer: Medicare Other | Admitting: *Deleted

## 2016-07-27 DIAGNOSIS — I4891 Unspecified atrial fibrillation: Secondary | ICD-10-CM | POA: Diagnosis not present

## 2016-07-27 DIAGNOSIS — Z5181 Encounter for therapeutic drug level monitoring: Secondary | ICD-10-CM | POA: Diagnosis not present

## 2016-07-27 DIAGNOSIS — G459 Transient cerebral ischemic attack, unspecified: Secondary | ICD-10-CM | POA: Diagnosis not present

## 2016-07-27 DIAGNOSIS — Z7901 Long term (current) use of anticoagulants: Secondary | ICD-10-CM

## 2016-07-27 LAB — POCT INR: INR: 2.8

## 2016-08-13 ENCOUNTER — Other Ambulatory Visit: Payer: Self-pay | Admitting: Internal Medicine

## 2016-09-07 ENCOUNTER — Ambulatory Visit (INDEPENDENT_AMBULATORY_CARE_PROVIDER_SITE_OTHER): Payer: Medicare Other | Admitting: *Deleted

## 2016-09-07 DIAGNOSIS — G459 Transient cerebral ischemic attack, unspecified: Secondary | ICD-10-CM | POA: Diagnosis not present

## 2016-09-07 DIAGNOSIS — Z5181 Encounter for therapeutic drug level monitoring: Secondary | ICD-10-CM | POA: Diagnosis not present

## 2016-09-07 DIAGNOSIS — Z7901 Long term (current) use of anticoagulants: Secondary | ICD-10-CM

## 2016-09-07 DIAGNOSIS — I4891 Unspecified atrial fibrillation: Secondary | ICD-10-CM | POA: Diagnosis not present

## 2016-09-07 LAB — POCT INR: INR: 2.2

## 2016-09-21 ENCOUNTER — Ambulatory Visit (INDEPENDENT_AMBULATORY_CARE_PROVIDER_SITE_OTHER): Payer: Medicare Other | Admitting: *Deleted

## 2016-09-21 DIAGNOSIS — I495 Sick sinus syndrome: Secondary | ICD-10-CM

## 2016-09-22 NOTE — Progress Notes (Signed)
Remote pacemaker transmission.   

## 2016-09-23 LAB — CUP PACEART REMOTE DEVICE CHECK
Battery Voltage: 2.9 V
Brady Statistic AP VS Percent: 86 %
Brady Statistic RA Percent Paced: 88 %
Brady Statistic RV Percent Paced: 11 %
Date Time Interrogation Session: 20171226081445
Implantable Lead Implant Date: 20120109
Implantable Lead Location: 753859
Implantable Pulse Generator Implant Date: 20120109
Lead Channel Pacing Threshold Amplitude: 0.625 V
Lead Channel Pacing Threshold Pulse Width: 0.4 ms
Lead Channel Sensing Intrinsic Amplitude: 12 mV
Lead Channel Setting Pacing Amplitude: 1.625
Lead Channel Setting Sensing Sensitivity: 3 mV
MDC IDC LEAD IMPLANT DT: 20120109
MDC IDC LEAD LOCATION: 753860
MDC IDC MSMT BATTERY REMAINING LONGEVITY: 77 mo
MDC IDC MSMT BATTERY REMAINING PERCENTAGE: 65 %
MDC IDC MSMT LEADCHNL RA IMPEDANCE VALUE: 400 Ohm
MDC IDC MSMT LEADCHNL RA SENSING INTR AMPL: 3.6 mV
MDC IDC MSMT LEADCHNL RV IMPEDANCE VALUE: 400 Ohm
MDC IDC MSMT LEADCHNL RV PACING THRESHOLD AMPLITUDE: 1 V
MDC IDC MSMT LEADCHNL RV PACING THRESHOLD PULSEWIDTH: 0.4 ms
MDC IDC SET LEADCHNL RV PACING AMPLITUDE: 1.25 V
MDC IDC SET LEADCHNL RV PACING PULSEWIDTH: 0.4 ms
MDC IDC STAT BRADY AP VP PERCENT: 8.9 %
MDC IDC STAT BRADY AS VP PERCENT: 1 %
MDC IDC STAT BRADY AS VS PERCENT: 5.1 %
Pulse Gen Model: 2210
Pulse Gen Serial Number: 7196227

## 2016-09-24 ENCOUNTER — Encounter: Payer: Self-pay | Admitting: Cardiology

## 2016-10-08 ENCOUNTER — Encounter: Payer: Self-pay | Admitting: Cardiology

## 2016-10-19 ENCOUNTER — Ambulatory Visit (INDEPENDENT_AMBULATORY_CARE_PROVIDER_SITE_OTHER): Payer: Medicare Other | Admitting: *Deleted

## 2016-10-19 DIAGNOSIS — Z5181 Encounter for therapeutic drug level monitoring: Secondary | ICD-10-CM | POA: Diagnosis not present

## 2016-10-19 DIAGNOSIS — G459 Transient cerebral ischemic attack, unspecified: Secondary | ICD-10-CM | POA: Diagnosis not present

## 2016-10-19 DIAGNOSIS — Z7901 Long term (current) use of anticoagulants: Secondary | ICD-10-CM

## 2016-10-19 DIAGNOSIS — I4891 Unspecified atrial fibrillation: Secondary | ICD-10-CM | POA: Diagnosis not present

## 2016-10-19 LAB — POCT INR: INR: 2.8

## 2016-11-30 ENCOUNTER — Ambulatory Visit (INDEPENDENT_AMBULATORY_CARE_PROVIDER_SITE_OTHER): Payer: Medicare Other | Admitting: *Deleted

## 2016-11-30 DIAGNOSIS — Z5181 Encounter for therapeutic drug level monitoring: Secondary | ICD-10-CM

## 2016-11-30 DIAGNOSIS — I4891 Unspecified atrial fibrillation: Secondary | ICD-10-CM

## 2016-11-30 DIAGNOSIS — G459 Transient cerebral ischemic attack, unspecified: Secondary | ICD-10-CM | POA: Diagnosis not present

## 2016-11-30 DIAGNOSIS — Z7901 Long term (current) use of anticoagulants: Secondary | ICD-10-CM | POA: Diagnosis not present

## 2016-11-30 LAB — POCT INR: INR: 2.6

## 2016-12-20 ENCOUNTER — Other Ambulatory Visit: Payer: Self-pay | Admitting: *Deleted

## 2016-12-20 MED ORDER — WARFARIN SODIUM 5 MG PO TABS: ORAL_TABLET | ORAL | 3 refills | Status: DC

## 2016-12-21 ENCOUNTER — Ambulatory Visit (INDEPENDENT_AMBULATORY_CARE_PROVIDER_SITE_OTHER): Payer: Medicare Other | Admitting: *Deleted

## 2016-12-21 DIAGNOSIS — I495 Sick sinus syndrome: Secondary | ICD-10-CM

## 2016-12-22 LAB — CUP PACEART REMOTE DEVICE CHECK
Implantable Lead Location: 753859
Implantable Pulse Generator Implant Date: 20120109
Lead Channel Setting Pacing Pulse Width: 0.4 ms
MDC IDC LEAD IMPLANT DT: 20120109
MDC IDC LEAD IMPLANT DT: 20120109
MDC IDC LEAD LOCATION: 753860
MDC IDC SESS DTM: 20180328191036
MDC IDC SET LEADCHNL RA PACING AMPLITUDE: 1.75 V
MDC IDC SET LEADCHNL RV PACING AMPLITUDE: 1.25 V
MDC IDC SET LEADCHNL RV SENSING SENSITIVITY: 2 mV
Pulse Gen Model: 2210
Pulse Gen Serial Number: 7196227

## 2016-12-22 NOTE — Progress Notes (Signed)
Remote pacemaker transmission.   

## 2016-12-24 ENCOUNTER — Encounter: Payer: Self-pay | Admitting: Cardiology

## 2017-01-07 ENCOUNTER — Encounter: Payer: Self-pay | Admitting: Cardiology

## 2017-01-11 ENCOUNTER — Ambulatory Visit (INDEPENDENT_AMBULATORY_CARE_PROVIDER_SITE_OTHER): Payer: Medicare Other | Admitting: *Deleted

## 2017-01-11 DIAGNOSIS — G459 Transient cerebral ischemic attack, unspecified: Secondary | ICD-10-CM | POA: Diagnosis not present

## 2017-01-11 DIAGNOSIS — Z7901 Long term (current) use of anticoagulants: Secondary | ICD-10-CM | POA: Diagnosis not present

## 2017-01-11 DIAGNOSIS — I4891 Unspecified atrial fibrillation: Secondary | ICD-10-CM | POA: Diagnosis not present

## 2017-01-11 DIAGNOSIS — Z5181 Encounter for therapeutic drug level monitoring: Secondary | ICD-10-CM

## 2017-01-11 LAB — POCT INR: INR: 2.8

## 2017-02-10 ENCOUNTER — Other Ambulatory Visit: Payer: Self-pay | Admitting: Internal Medicine

## 2017-02-22 ENCOUNTER — Ambulatory Visit (INDEPENDENT_AMBULATORY_CARE_PROVIDER_SITE_OTHER): Payer: Medicare Other | Admitting: *Deleted

## 2017-02-22 DIAGNOSIS — G459 Transient cerebral ischemic attack, unspecified: Secondary | ICD-10-CM | POA: Diagnosis not present

## 2017-02-22 DIAGNOSIS — Z7901 Long term (current) use of anticoagulants: Secondary | ICD-10-CM

## 2017-02-22 DIAGNOSIS — Z5181 Encounter for therapeutic drug level monitoring: Secondary | ICD-10-CM

## 2017-02-22 DIAGNOSIS — I4891 Unspecified atrial fibrillation: Secondary | ICD-10-CM

## 2017-02-22 LAB — POCT INR: INR: 2.2

## 2017-03-16 ENCOUNTER — Other Ambulatory Visit: Payer: Self-pay | Admitting: *Deleted

## 2017-03-16 MED ORDER — TIKOSYN 500 MCG PO CAPS: ORAL_CAPSULE | ORAL | 0 refills | Status: DC

## 2017-03-18 ENCOUNTER — Ambulatory Visit (INDEPENDENT_AMBULATORY_CARE_PROVIDER_SITE_OTHER): Payer: Medicare Other | Admitting: Internal Medicine

## 2017-03-18 ENCOUNTER — Encounter: Payer: Self-pay | Admitting: Internal Medicine

## 2017-03-18 VITALS — BP 120/82 | HR 67 | Ht 70.0 in | Wt 201.0 lb

## 2017-03-18 DIAGNOSIS — Z950 Presence of cardiac pacemaker: Secondary | ICD-10-CM

## 2017-03-18 DIAGNOSIS — I495 Sick sinus syndrome: Secondary | ICD-10-CM | POA: Diagnosis not present

## 2017-03-18 NOTE — Progress Notes (Signed)
HPI [name] returns today for followup. He is a pleasant 76 yo man with a h/o PAF and symptomatic tachybrady syndrome on Tikosyn. In the interim he has done well. He exercises daily.  He has not felt his atrial fib since we saw him last.  Allergies  Allergen Reactions  . Sulfonamide Derivatives Other (See Comments)    Breathing trouble   . Codeine Nausea And Vomiting  . Contrast Media [Iodinated Diagnostic Agents] Hives     Current Outpatient Prescriptions  Medication Sig Dispense Refill  . gabapentin (NEURONTIN) 300 MG capsule Take 300 mg by mouth 3 (three) times daily.     . metoprolol tartrate (LOPRESSOR) 25 MG tablet TAKE 1 TABLET BY MOUTH TWICE DAILY 60 tablet 11  . ranitidine (ZANTAC) 300 MG tablet Take 1 tablet by mouth daily as needed. Acid reflux or heartburn    . tadalafil (CIALIS) 5 MG tablet Take 5 mg by mouth daily as needed. Take for urine function    . testosterone cypionate (DEPOTESTOTERONE CYPIONATE) 200 MG/ML injection Inject 10 mg into the muscle every 14 (fourteen) days.     Marland Kitchen TIKOSYN 500 MCG capsule TAKE 1 CAPSULE BY MOUTH 2 TIMES DAILY. 60 capsule 0  . warfarin (COUMADIN) 5 MG tablet Take 1 1/2 tablets daily except 2 tablets on Wednesdays 60 tablet 3   No current facility-administered medications for this visit.      Past Medical History:  Diagnosis Date  . AF (paroxysmal atrial fibrillation) ([place])   . Anxiety   . CHEST PAIN UNSPECIFIED   . Complication of anesthesia   . Depression   . DIZZINESS   . Dysrhythmia    hx of atrial fibrilation  . ERYTHROCYTOSIS   . Essential hypertension, benign   . GERD (gastroesophageal reflux disease)   . Melanoma ([place])   . NUMBNESS   . Other malaise and fatigue   . Pacemaker ? 2012  . Painful respiration   . PONV (postoperative nausea and vomiting)   . SICK SINUS SYNDROME   . TIA   . Visual loss    intermittent    ROS:   All systems reviewed and negative except as noted in the HPI.   Past  Surgical History:  Procedure Laterality Date  . CHOLECYSTECTOMY    . COLONOSCOPY  2013  . INSERT / REPLACE / REMOVE PACEMAKER    . LYMPH NODE BIOPSY Left   . PACEMAKER INSERTION    . skin cancer removal    . VASECTOMY       Family History  Problem Relation Age of Onset  . Prostate cancer Father      Social History   Social History  . Marital status: Married    Spouse name: N/A  . Number of children: N/A  . Years of education: N/A   Occupational History  . Not on file.   Social History Main Topics  . Smoking status: Former Research scientist (life sciences)  . Smokeless tobacco: Never Used     Comment: quit in 1976  . Alcohol use No  . Drug use: No  . Sexual activity: Not on file   Other Topics Concern  . Not on file   Social History Narrative  . No narrative on file     BP 120/82   Pulse 67   Ht 5\' 10"  (1.778 m)   Wt 201 lb (91.2 kg)   SpO2 97%   BMI 28.84 kg/m   Physical Exam:  Well appearing  76 yo man, NAD HEENT: Unremarkable Neck:  6 cm JVD, no thyromegally Back:  No CVA tenderness Lungs:  Clear with no wheezes HEART:  Regular rate rhythm, no murmurs, no rubs, no clicks Abd:  soft, positive bowel sounds, no organomegally, no rebound, no guarding Ext:  2 plus pulses, no edema, no cyanosis, no clubbing Skin:  No rashes no nodules Neuro:  CN II through XII intact, motor grossly intact  ECG - NSR with atrial pacing  DEVICE  Normal device function.  See PaceArt for details.   Assess/Plan: 1. PAF - he is maintaining NSR about 97% of the time. He will continue his Tikosyn 2. HTN - his blood pressure is well controlled. Will follow. 3. PPM - his St. Jude device is working normally. Will recheck in several months. 4. Sinus node dysfunction - he is asymptomatic, s/p PPM insertion.  Mikle Bosworth.D.

## 2017-03-18 NOTE — Patient Instructions (Addendum)
Medication Instructions:  Your physician recommends that you continue on your current medications as directed. Please refer to the Current Medication list given to you today.   Labwork: None Ordered   Testing/Procedures: None Ordered   Follow-Up: Your physician wants you to follow-up in: 1 year with Dr. Lovena Le. You will receive a reminder letter in the mail two months in advance. If you don't receive a letter, please call our office to schedule the follow-up appointment.  Remote monitoring is used to monitor your Pacemaker from home. This monitoring reduces the number of office visits required to check your device to one time per year. It allows [place] to keep an eye on the functioning of your device to ensure it is working properly. You are scheduled for a device check from home on  06/20/17 . You may send your transmission at any time that day. If you have a wireless device, the transmission will be sent automatically. After your physician reviews your transmission, you will receive a postcard with your next transmission date.    Any Other Special Instructions Will Be Listed Below (If Applicable).     If you need a refill on your cardiac medications before your next appointment, please call your pharmacy.

## 2017-03-21 LAB — CUP PACEART INCLINIC DEVICE CHECK
Battery Voltage: 2.89 V
Brady Statistic RA Percent Paced: 87 %
Implantable Lead Implant Date: 20120109
Implantable Lead Location: 753860
Implantable Pulse Generator Implant Date: 20120109
Lead Channel Impedance Value: 375 Ohm
Lead Channel Impedance Value: 425 Ohm
Lead Channel Pacing Threshold Pulse Width: 0.4 ms
Lead Channel Sensing Intrinsic Amplitude: 3.3 mV
Lead Channel Setting Pacing Amplitude: 1.625
MDC IDC LEAD IMPLANT DT: 20120109
MDC IDC LEAD LOCATION: 753859
MDC IDC MSMT BATTERY REMAINING LONGEVITY: 63 mo
MDC IDC MSMT LEADCHNL RA PACING THRESHOLD AMPLITUDE: 0.75 V
MDC IDC MSMT LEADCHNL RV PACING THRESHOLD AMPLITUDE: 1 V
MDC IDC MSMT LEADCHNL RV PACING THRESHOLD PULSEWIDTH: 0.4 ms
MDC IDC MSMT LEADCHNL RV SENSING INTR AMPL: 12 mV
MDC IDC SESS DTM: 20180622132004
MDC IDC SET LEADCHNL RV PACING AMPLITUDE: 1.25 V
MDC IDC SET LEADCHNL RV PACING PULSEWIDTH: 0.4 ms
MDC IDC SET LEADCHNL RV SENSING SENSITIVITY: 3 mV
MDC IDC STAT BRADY RV PERCENT PACED: 12 %
Pulse Gen Model: 2210
Pulse Gen Serial Number: 7196227

## 2017-03-27 ENCOUNTER — Other Ambulatory Visit: Payer: Self-pay | Admitting: Internal Medicine

## 2017-04-05 ENCOUNTER — Ambulatory Visit (INDEPENDENT_AMBULATORY_CARE_PROVIDER_SITE_OTHER): Payer: Medicare Other | Admitting: *Deleted

## 2017-04-05 DIAGNOSIS — I4891 Unspecified atrial fibrillation: Secondary | ICD-10-CM | POA: Diagnosis not present

## 2017-04-05 DIAGNOSIS — Z7901 Long term (current) use of anticoagulants: Secondary | ICD-10-CM | POA: Diagnosis not present

## 2017-04-05 DIAGNOSIS — Z5181 Encounter for therapeutic drug level monitoring: Secondary | ICD-10-CM | POA: Diagnosis not present

## 2017-04-05 DIAGNOSIS — G459 Transient cerebral ischemic attack, unspecified: Secondary | ICD-10-CM | POA: Diagnosis not present

## 2017-04-05 LAB — POCT INR: INR: 2.4

## 2017-04-11 ENCOUNTER — Other Ambulatory Visit: Payer: Self-pay | Admitting: Internal Medicine

## 2017-04-13 ENCOUNTER — Other Ambulatory Visit: Payer: Self-pay | Admitting: Internal Medicine

## 2017-05-17 ENCOUNTER — Ambulatory Visit (INDEPENDENT_AMBULATORY_CARE_PROVIDER_SITE_OTHER): Payer: Medicare Other | Admitting: *Deleted

## 2017-05-17 DIAGNOSIS — Z5181 Encounter for therapeutic drug level monitoring: Secondary | ICD-10-CM

## 2017-05-17 DIAGNOSIS — G459 Transient cerebral ischemic attack, unspecified: Secondary | ICD-10-CM | POA: Diagnosis not present

## 2017-05-17 DIAGNOSIS — I4891 Unspecified atrial fibrillation: Secondary | ICD-10-CM | POA: Diagnosis not present

## 2017-05-17 DIAGNOSIS — Z7901 Long term (current) use of anticoagulants: Secondary | ICD-10-CM | POA: Diagnosis not present

## 2017-05-17 LAB — POCT INR: INR: 2.3

## 2017-06-20 ENCOUNTER — Ambulatory Visit (INDEPENDENT_AMBULATORY_CARE_PROVIDER_SITE_OTHER): Payer: Medicare Other | Admitting: *Deleted

## 2017-06-20 DIAGNOSIS — I495 Sick sinus syndrome: Secondary | ICD-10-CM

## 2017-06-21 NOTE — Progress Notes (Signed)
Remote pacemaker transmission.   

## 2017-06-23 ENCOUNTER — Encounter: Payer: Self-pay | Admitting: Cardiology

## 2017-06-24 LAB — CUP PACEART REMOTE DEVICE CHECK
Battery Remaining Percentage: 65 %
Brady Statistic AP VP Percent: 9.8 %
Brady Statistic AP VS Percent: 88 %
Brady Statistic AS VP Percent: 1 %
Brady Statistic AS VS Percent: 1.4 %
Brady Statistic RV Percent Paced: 15 %
Date Time Interrogation Session: 20180924075311
Implantable Lead Implant Date: 20120109
Implantable Lead Location: 753859
Implantable Lead Location: 753860
Lead Channel Impedance Value: 360 Ohm
Lead Channel Pacing Threshold Amplitude: 0.625 V
Lead Channel Pacing Threshold Amplitude: 0.875 V
Lead Channel Pacing Threshold Pulse Width: 0.4 ms
Lead Channel Sensing Intrinsic Amplitude: 12 mV
Lead Channel Sensing Intrinsic Amplitude: 5 mV
Lead Channel Setting Pacing Amplitude: 1.125
Lead Channel Setting Pacing Amplitude: 1.625
Lead Channel Setting Sensing Sensitivity: 3 mV
MDC IDC LEAD IMPLANT DT: 20120109
MDC IDC MSMT BATTERY REMAINING LONGEVITY: 71 mo
MDC IDC MSMT BATTERY VOLTAGE: 2.9 V
MDC IDC MSMT LEADCHNL RA IMPEDANCE VALUE: 340 Ohm
MDC IDC MSMT LEADCHNL RA PACING THRESHOLD PULSEWIDTH: 0.4 ms
MDC IDC PG IMPLANT DT: 20120109
MDC IDC SET LEADCHNL RV PACING PULSEWIDTH: 0.4 ms
MDC IDC STAT BRADY RA PERCENT PACED: 77 %
Pulse Gen Model: 2210
Pulse Gen Serial Number: 7196227

## 2017-06-28 ENCOUNTER — Ambulatory Visit (INDEPENDENT_AMBULATORY_CARE_PROVIDER_SITE_OTHER): Payer: Medicare Other | Admitting: *Deleted

## 2017-06-28 DIAGNOSIS — G459 Transient cerebral ischemic attack, unspecified: Secondary | ICD-10-CM

## 2017-06-28 DIAGNOSIS — I4891 Unspecified atrial fibrillation: Secondary | ICD-10-CM

## 2017-06-28 DIAGNOSIS — Z5181 Encounter for therapeutic drug level monitoring: Secondary | ICD-10-CM | POA: Diagnosis not present

## 2017-06-28 DIAGNOSIS — Z7901 Long term (current) use of anticoagulants: Secondary | ICD-10-CM

## 2017-06-28 LAB — POCT INR: INR: 2.9

## 2017-07-08 ENCOUNTER — Encounter: Payer: Self-pay | Admitting: Cardiology

## 2017-07-26 ENCOUNTER — Other Ambulatory Visit: Payer: Self-pay | Admitting: *Deleted

## 2017-07-26 MED ORDER — WARFARIN SODIUM 5 MG PO TABS: ORAL_TABLET | ORAL | 4 refills | Status: DC

## 2017-08-11 ENCOUNTER — Ambulatory Visit (INDEPENDENT_AMBULATORY_CARE_PROVIDER_SITE_OTHER): Payer: Medicare Other | Admitting: *Deleted

## 2017-08-11 DIAGNOSIS — Z7901 Long term (current) use of anticoagulants: Secondary | ICD-10-CM | POA: Diagnosis not present

## 2017-08-11 DIAGNOSIS — G459 Transient cerebral ischemic attack, unspecified: Secondary | ICD-10-CM

## 2017-08-11 DIAGNOSIS — Z5181 Encounter for therapeutic drug level monitoring: Secondary | ICD-10-CM

## 2017-08-11 DIAGNOSIS — I4891 Unspecified atrial fibrillation: Secondary | ICD-10-CM | POA: Diagnosis not present

## 2017-08-11 LAB — POCT INR: INR: 2.7

## 2017-09-19 ENCOUNTER — Encounter: Payer: Medicare Other | Admitting: *Deleted

## 2017-09-21 ENCOUNTER — Encounter: Payer: Self-pay | Admitting: Cardiology

## 2017-09-22 ENCOUNTER — Ambulatory Visit (INDEPENDENT_AMBULATORY_CARE_PROVIDER_SITE_OTHER): Payer: Medicare Other | Admitting: *Deleted

## 2017-09-22 DIAGNOSIS — Z5181 Encounter for therapeutic drug level monitoring: Secondary | ICD-10-CM | POA: Diagnosis not present

## 2017-09-22 DIAGNOSIS — G459 Transient cerebral ischemic attack, unspecified: Secondary | ICD-10-CM | POA: Diagnosis not present

## 2017-09-22 DIAGNOSIS — Z7901 Long term (current) use of anticoagulants: Secondary | ICD-10-CM

## 2017-09-22 DIAGNOSIS — I4891 Unspecified atrial fibrillation: Secondary | ICD-10-CM

## 2017-09-22 LAB — POCT INR: INR: 2.2

## 2017-09-22 NOTE — Patient Instructions (Signed)
Continue coumadin 1 1/2 tablets daily except 2 tablets on Wednesdays  Recheck in 6 weeks

## 2017-11-03 ENCOUNTER — Ambulatory Visit (INDEPENDENT_AMBULATORY_CARE_PROVIDER_SITE_OTHER): Payer: Medicare Other | Admitting: *Deleted

## 2017-11-03 DIAGNOSIS — Z7901 Long term (current) use of anticoagulants: Secondary | ICD-10-CM | POA: Diagnosis not present

## 2017-11-03 DIAGNOSIS — G459 Transient cerebral ischemic attack, unspecified: Secondary | ICD-10-CM

## 2017-11-03 DIAGNOSIS — Z5181 Encounter for therapeutic drug level monitoring: Secondary | ICD-10-CM | POA: Diagnosis not present

## 2017-11-03 DIAGNOSIS — I4891 Unspecified atrial fibrillation: Secondary | ICD-10-CM | POA: Diagnosis not present

## 2017-11-03 LAB — POCT INR: INR: 2

## 2017-11-03 NOTE — Patient Instructions (Signed)
Increase coumadin to 1 1/2 tablets daily except 2 tablets on Mondays, Wednesdays and Fridays  Recheck in 6 weeks

## 2017-12-15 ENCOUNTER — Ambulatory Visit (INDEPENDENT_AMBULATORY_CARE_PROVIDER_SITE_OTHER): Payer: Medicare Other | Admitting: *Deleted

## 2017-12-15 DIAGNOSIS — I4891 Unspecified atrial fibrillation: Secondary | ICD-10-CM | POA: Diagnosis not present

## 2017-12-15 DIAGNOSIS — G459 Transient cerebral ischemic attack, unspecified: Secondary | ICD-10-CM | POA: Diagnosis not present

## 2017-12-15 DIAGNOSIS — Z5181 Encounter for therapeutic drug level monitoring: Secondary | ICD-10-CM

## 2017-12-15 LAB — POCT INR: INR: 2.7

## 2017-12-15 NOTE — Patient Instructions (Signed)
Continue coumadin 1 1/2 tablets daily except 2 tablets on Mondays, Wednesdays and Fridays  Recheck in 6 weeks

## 2017-12-19 ENCOUNTER — Ambulatory Visit (INDEPENDENT_AMBULATORY_CARE_PROVIDER_SITE_OTHER): Payer: Medicare Other | Admitting: *Deleted

## 2017-12-19 DIAGNOSIS — I495 Sick sinus syndrome: Secondary | ICD-10-CM | POA: Diagnosis not present

## 2017-12-19 NOTE — Progress Notes (Signed)
Remote pacemaker transmission.   

## 2017-12-20 ENCOUNTER — Encounter: Payer: Self-pay | Admitting: Cardiology

## 2017-12-20 LAB — CUP PACEART REMOTE DEVICE CHECK
Battery Remaining Longevity: 46 mo
Brady Statistic AP VS Percent: 87 %
Brady Statistic AS VP Percent: 1.3 %
Brady Statistic RV Percent Paced: 16 %
Date Time Interrogation Session: 20190325130059
Implantable Lead Implant Date: 20120109
Lead Channel Impedance Value: 350 Ohm
Lead Channel Pacing Threshold Amplitude: 1 V
Lead Channel Sensing Intrinsic Amplitude: 12 mV
Lead Channel Setting Pacing Amplitude: 1.625
Lead Channel Setting Pacing Pulse Width: 0.4 ms
Lead Channel Setting Sensing Sensitivity: 3 mV
MDC IDC LEAD IMPLANT DT: 20120109
MDC IDC LEAD LOCATION: 753859
MDC IDC LEAD LOCATION: 753860
MDC IDC MSMT BATTERY REMAINING PERCENTAGE: 40 %
MDC IDC MSMT BATTERY VOLTAGE: 2.84 V
MDC IDC MSMT LEADCHNL RA PACING THRESHOLD AMPLITUDE: 0.625 V
MDC IDC MSMT LEADCHNL RA PACING THRESHOLD PULSEWIDTH: 0.4 ms
MDC IDC MSMT LEADCHNL RA SENSING INTR AMPL: 3 mV
MDC IDC MSMT LEADCHNL RV IMPEDANCE VALUE: 360 Ohm
MDC IDC MSMT LEADCHNL RV PACING THRESHOLD PULSEWIDTH: 0.4 ms
MDC IDC PG IMPLANT DT: 20120109
MDC IDC SET LEADCHNL RV PACING AMPLITUDE: 1.25 V
MDC IDC STAT BRADY AP VP PERCENT: 11 %
MDC IDC STAT BRADY AS VS PERCENT: 1.1 %
MDC IDC STAT BRADY RA PERCENT PACED: 81 %
Pulse Gen Model: 2210
Pulse Gen Serial Number: 7196227

## 2018-01-26 ENCOUNTER — Ambulatory Visit (INDEPENDENT_AMBULATORY_CARE_PROVIDER_SITE_OTHER): Payer: Medicare Other | Admitting: *Deleted

## 2018-01-26 DIAGNOSIS — Z5181 Encounter for therapeutic drug level monitoring: Secondary | ICD-10-CM | POA: Diagnosis not present

## 2018-01-26 DIAGNOSIS — I4891 Unspecified atrial fibrillation: Secondary | ICD-10-CM | POA: Diagnosis not present

## 2018-01-26 DIAGNOSIS — G459 Transient cerebral ischemic attack, unspecified: Secondary | ICD-10-CM

## 2018-01-26 LAB — POCT INR
INR: 3.2
INR: 3.2

## 2018-01-26 NOTE — Patient Instructions (Signed)
Take coumadin 1 tablet tonight then resume 1 1/2 tablets daily except 2 tablets on Mondays, Wednesdays and Fridays  Recheck in 6 weeks

## 2018-01-27 ENCOUNTER — Other Ambulatory Visit: Payer: Self-pay | Admitting: Internal Medicine

## 2018-03-09 ENCOUNTER — Ambulatory Visit (INDEPENDENT_AMBULATORY_CARE_PROVIDER_SITE_OTHER): Payer: Medicare Other | Admitting: *Deleted

## 2018-03-09 DIAGNOSIS — I4891 Unspecified atrial fibrillation: Secondary | ICD-10-CM | POA: Diagnosis not present

## 2018-03-09 DIAGNOSIS — Z5181 Encounter for therapeutic drug level monitoring: Secondary | ICD-10-CM

## 2018-03-09 DIAGNOSIS — G459 Transient cerebral ischemic attack, unspecified: Secondary | ICD-10-CM | POA: Diagnosis not present

## 2018-03-09 LAB — POCT INR: INR: 3.2 — AB (ref 2.0–3.0)

## 2018-03-09 NOTE — Patient Instructions (Signed)
Decrease coumadin to 1 1/2 tablets daily except 2 tablets on Mondays and Fridays  Recheck in 4 weeks

## 2018-03-20 ENCOUNTER — Telehealth: Payer: Self-pay

## 2018-03-20 ENCOUNTER — Encounter: Payer: Medicare Other | Admitting: *Deleted

## 2018-03-20 NOTE — Telephone Encounter (Signed)
LMOVM reminding pt to send remote transmission.   

## 2018-03-22 ENCOUNTER — Encounter: Payer: Self-pay | Admitting: Cardiology

## 2018-03-23 ENCOUNTER — Other Ambulatory Visit: Payer: Self-pay | Admitting: Internal Medicine

## 2018-03-23 ENCOUNTER — Encounter: Payer: Self-pay | Admitting: Internal Medicine

## 2018-03-29 ENCOUNTER — Encounter: Payer: Self-pay | Admitting: Internal Medicine

## 2018-03-29 ENCOUNTER — Ambulatory Visit: Payer: Medicare Other | Admitting: Internal Medicine

## 2018-03-29 VITALS — BP 158/90 | HR 60 | Ht 70.0 in | Wt 179.0 lb

## 2018-03-29 DIAGNOSIS — Z950 Presence of cardiac pacemaker: Secondary | ICD-10-CM | POA: Diagnosis not present

## 2018-03-29 DIAGNOSIS — I495 Sick sinus syndrome: Secondary | ICD-10-CM | POA: Diagnosis not present

## 2018-03-29 DIAGNOSIS — I4891 Unspecified atrial fibrillation: Secondary | ICD-10-CM

## 2018-03-29 DIAGNOSIS — I10 Essential (primary) hypertension: Secondary | ICD-10-CM

## 2018-03-29 NOTE — Progress Notes (Signed)
HPI Mr. Trussell returns today for followup. He is a pleasant 77 yo man with a h/o PAF and symptomatic tachybrady syndrome, s/p PP, on Tikosyn. In the interim he has done well. He exercises daily.  He has not felt his atrial fib since we saw him last. 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 No edema.  Allergies  Allergen Reactions  . Sulfonamide Derivatives Other (See Comments)    Breathing trouble   . Codeine Nausea And Vomiting  . Contrast Media [Iodinated Diagnostic Agents] Hives     Current Outpatient Medications  Medication Sig Dispense Refill  . gabapentin (NEURONTIN) 300 MG capsule Take 300 mg by mouth 3 (three) times daily.     . metoprolol tartrate (LOPRESSOR) 25 MG tablet TAKE 1 TABLET BY MOUTH TWICE DAILY 60 tablet 11  . ranitidine (ZANTAC) 300 MG tablet Take 1 tablet by mouth daily as needed. Acid reflux or heartburn    . tadalafil (CIALIS) 5 MG tablet Take 5 mg by mouth daily as needed. Take for urine function    . testosterone cypionate (DEPOTESTOTERONE CYPIONATE) 200 MG/ML injection Inject 10 mg into the muscle every 14 (fourteen) days.     Marland Kitchen TIKOSYN 500 MCG capsule TAKE ONE CAPSULE BY MOUTH TWICE DAILY 60 capsule 0  . warfarin (COUMADIN) 5 MG tablet TAKE ONE & ONE-HALF TABLET BY MOUTH EVERY DAY EXPECT ON WEDNESDAYS (TAKE TWO TABS ON WED) 50 tablet 3   No current facility-administered medications for this visit.      Past Medical History:  Diagnosis Date  . AF (paroxysmal atrial fibrillation) ([place])   . Anxiety   . CHEST PAIN UNSPECIFIED   . Complication of anesthesia   . Depression   . DIZZINESS   . Dysrhythmia    hx of atrial fibrilation  . ERYTHROCYTOSIS   . Essential hypertension, benign   . GERD (gastroesophageal reflux disease)   . Melanoma ([place])   . NUMBNESS   . Other malaise and fatigue   . Pacemaker ? 2012  . Painful respiration   . PONV (postoperative nausea and vomiting)   . SICK SINUS SYNDROME   . TIA   . Visual loss    intermittent    ROS:   All systems reviewed and negative except as noted in the HPI.   Past Surgical History:  Procedure Laterality Date  . CHOLECYSTECTOMY    . COLONOSCOPY  2013  . INSERT / REPLACE / REMOVE PACEMAKER    . LYMPH NODE BIOPSY Left   . PACEMAKER INSERTION    . skin cancer removal    . VASECTOMY       Family History  Problem Relation Age of Onset  . Prostate cancer Father      Social History   Socioeconomic History  . Marital status: Married    Spouse name: Not on file  . Number of children: Not on file  . Years of education: Not on file  . Highest education level: Not on file  Occupational History  . Not on file  Social Needs  . Financial resource strain: Not on file  . Food insecurity:    Worry: Not on file    Inability: Not on file  . Transportation needs:    Medical: Not on file    Non-medical: Not on file  Tobacco Use  . Smoking status: Former Research scientist (life sciences)  . Smokeless tobacco: Never Used  . Tobacco comment: quit in 1976  Substance and  Sexual Activity  . Alcohol use: No  . Drug use: No  . Sexual activity: Not on file  Lifestyle  . Physical activity:    Days per week: Not on file    Minutes per session: Not on file  . Stress: Not on file  Relationships  . Social connections:    Talks on phone: Not on file    Gets together: Not on file    Attends religious service: Not on file    Active member of club or organization: Not on file    Attends meetings of clubs or organizations: Not on file    Relationship status: Not on file  . Intimate partner violence:    Fear of current or ex partner: Not on file    Emotionally abused: Not on file    Physically abused: Not on file    Forced sexual activity: Not on file  Other Topics Concern  . Not on file  Social History Narrative  . Not on file     Ht 5\' 10"  (1.778 m)   BMI 28.84 kg/m   Physical Exam:  Well appearing NAD HEENT: Unremarkable Neck:  No JVD, no thyromegally Lymphatics:  No  adenopathy Back:  No CVA tenderness Lungs:  Clear with no wheezes HEART:  Regular rate rhythm, no murmurs, no rubs, no clicks Abd:  soft, positive bowel sounds, no organomegally, no rebound, no guarding Ext:  2 plus pulses, no edema, no cyanosis, no clubbing Skin:  No rashes no nodules Neuro:  CN II through XII intact, motor grossly intact  EKG - nsr with atrial pacing  DEVICE  Normal device function.  See PaceArt for details.   Assess/Plan: 1. Sinus node dysfunction - he is s/p PPM insertion.  2. PPM - his St. Jude DDD PM is working normally. He has about 3 years of battery longevity. 3. PAF - he will continue his dofetilide. He is rhythm about 95% of the time.  Mikle Bosworth.D.

## 2018-03-29 NOTE — Patient Instructions (Signed)

## 2018-03-31 LAB — CUP PACEART INCLINIC DEVICE CHECK
Brady Statistic RA Percent Paced: 80 %
Brady Statistic RV Percent Paced: 17 %
Date Time Interrogation Session: 20190703173900
Implantable Lead Implant Date: 20120109
Implantable Lead Location: 753859
Lead Channel Impedance Value: 362.5 Ohm
Lead Channel Pacing Threshold Amplitude: 0.75 V
Lead Channel Pacing Threshold Amplitude: 1 V
Lead Channel Pacing Threshold Pulse Width: 0.4 ms
Lead Channel Pacing Threshold Pulse Width: 0.4 ms
Lead Channel Sensing Intrinsic Amplitude: 12 mV
Lead Channel Setting Pacing Amplitude: 2.5 V
Lead Channel Setting Pacing Pulse Width: 0.4 ms
Lead Channel Setting Sensing Sensitivity: 3 mV
MDC IDC LEAD IMPLANT DT: 20120109
MDC IDC LEAD LOCATION: 753860
MDC IDC MSMT BATTERY REMAINING LONGEVITY: 36 mo
MDC IDC MSMT BATTERY VOLTAGE: 2.83 V
MDC IDC MSMT LEADCHNL RA IMPEDANCE VALUE: 362.5 Ohm
MDC IDC MSMT LEADCHNL RA PACING THRESHOLD AMPLITUDE: 0.75 V
MDC IDC MSMT LEADCHNL RA PACING THRESHOLD PULSEWIDTH: 0.4 ms
MDC IDC MSMT LEADCHNL RA SENSING INTR AMPL: 3.2 mV
MDC IDC MSMT LEADCHNL RV PACING THRESHOLD AMPLITUDE: 1 V
MDC IDC MSMT LEADCHNL RV PACING THRESHOLD PULSEWIDTH: 0.4 ms
MDC IDC PG IMPLANT DT: 20120109
MDC IDC SET LEADCHNL RA PACING AMPLITUDE: 1.625
Pulse Gen Model: 2210
Pulse Gen Serial Number: 7196227

## 2018-04-06 ENCOUNTER — Ambulatory Visit (INDEPENDENT_AMBULATORY_CARE_PROVIDER_SITE_OTHER): Payer: Medicare Other | Admitting: *Deleted

## 2018-04-06 DIAGNOSIS — I4891 Unspecified atrial fibrillation: Secondary | ICD-10-CM | POA: Diagnosis not present

## 2018-04-06 DIAGNOSIS — Z5181 Encounter for therapeutic drug level monitoring: Secondary | ICD-10-CM

## 2018-04-06 DIAGNOSIS — G459 Transient cerebral ischemic attack, unspecified: Secondary | ICD-10-CM

## 2018-04-06 LAB — POCT INR: INR: 2.8 (ref 2.0–3.0)

## 2018-04-06 NOTE — Patient Instructions (Signed)
Continue coumadin 1 1/2 tablets daily except 2 tablets on Mondays and Fridays  Recheck in 4 weeks

## 2018-04-17 ENCOUNTER — Other Ambulatory Visit: Payer: Self-pay | Admitting: Internal Medicine

## 2018-04-21 ENCOUNTER — Other Ambulatory Visit: Payer: Self-pay | Admitting: Internal Medicine

## 2018-05-04 ENCOUNTER — Ambulatory Visit: Payer: Medicare Other | Admitting: *Deleted

## 2018-05-04 DIAGNOSIS — G459 Transient cerebral ischemic attack, unspecified: Secondary | ICD-10-CM | POA: Diagnosis not present

## 2018-05-04 DIAGNOSIS — Z5181 Encounter for therapeutic drug level monitoring: Secondary | ICD-10-CM

## 2018-05-04 DIAGNOSIS — I4891 Unspecified atrial fibrillation: Secondary | ICD-10-CM | POA: Diagnosis not present

## 2018-05-04 LAB — POCT INR: INR: 3.9 — AB (ref 2.0–3.0)

## 2018-05-04 NOTE — Patient Instructions (Signed)
Hold coumadin tonight then decrease dose to 1 1/2 tablets daily  Recheck in 2.5 weeks

## 2018-05-23 ENCOUNTER — Ambulatory Visit (INDEPENDENT_AMBULATORY_CARE_PROVIDER_SITE_OTHER): Payer: Medicare Other | Admitting: *Deleted

## 2018-05-23 DIAGNOSIS — I4891 Unspecified atrial fibrillation: Secondary | ICD-10-CM | POA: Diagnosis not present

## 2018-05-23 DIAGNOSIS — Z5181 Encounter for therapeutic drug level monitoring: Secondary | ICD-10-CM

## 2018-05-23 DIAGNOSIS — G459 Transient cerebral ischemic attack, unspecified: Secondary | ICD-10-CM

## 2018-05-23 LAB — POCT INR: INR: 2.2 (ref 2.0–3.0)

## 2018-05-23 NOTE — Patient Instructions (Signed)
Increase coumadin to 1 1/2 tablets daily except 2 tablets on Tuesdays Dr Lovena Le prefers higher end of range.  Pt has stopped taking Tylenol at night Recheck in 4 weeks

## 2018-06-01 ENCOUNTER — Other Ambulatory Visit: Payer: Self-pay | Admitting: Internal Medicine

## 2018-06-19 ENCOUNTER — Ambulatory Visit (INDEPENDENT_AMBULATORY_CARE_PROVIDER_SITE_OTHER): Payer: Medicare Other | Admitting: *Deleted

## 2018-06-19 DIAGNOSIS — I495 Sick sinus syndrome: Secondary | ICD-10-CM | POA: Diagnosis not present

## 2018-06-19 NOTE — Progress Notes (Signed)
Remote pacemaker transmission.   

## 2018-06-20 ENCOUNTER — Encounter: Payer: Self-pay | Admitting: Cardiology

## 2018-06-22 ENCOUNTER — Ambulatory Visit: Payer: Medicare Other | Admitting: *Deleted

## 2018-06-22 DIAGNOSIS — Z5181 Encounter for therapeutic drug level monitoring: Secondary | ICD-10-CM

## 2018-06-22 DIAGNOSIS — G459 Transient cerebral ischemic attack, unspecified: Secondary | ICD-10-CM | POA: Diagnosis not present

## 2018-06-22 DIAGNOSIS — I4891 Unspecified atrial fibrillation: Secondary | ICD-10-CM | POA: Diagnosis not present

## 2018-06-22 LAB — POCT INR: INR: 2.4 (ref 2.0–3.0)

## 2018-06-22 NOTE — Patient Instructions (Signed)
Continue coumadin 1 1/2 tablets daily except 2 tablets on Tuesdays Recheck in 4 weeks

## 2018-07-08 LAB — CUP PACEART REMOTE DEVICE CHECK
Battery Remaining Longevity: 32 mo
Brady Statistic AP VS Percent: 84 %
Brady Statistic AS VP Percent: 1 %
Brady Statistic AS VS Percent: 1 %
Brady Statistic RA Percent Paced: 52 %
Implantable Lead Implant Date: 20120109
Implantable Lead Implant Date: 20120109
Implantable Lead Location: 753859
Lead Channel Impedance Value: 340 Ohm
Lead Channel Pacing Threshold Amplitude: 1 V
Lead Channel Pacing Threshold Pulse Width: 0.4 ms
Lead Channel Sensing Intrinsic Amplitude: 12 mV
Lead Channel Sensing Intrinsic Amplitude: 3.9 mV
Lead Channel Setting Pacing Amplitude: 2.5 V
Lead Channel Setting Pacing Pulse Width: 0.4 ms
Lead Channel Setting Sensing Sensitivity: 3 mV
MDC IDC LEAD LOCATION: 753860
MDC IDC MSMT BATTERY REMAINING PERCENTAGE: 29 %
MDC IDC MSMT BATTERY VOLTAGE: 2.81 V
MDC IDC MSMT LEADCHNL RA PACING THRESHOLD AMPLITUDE: 0.625 V
MDC IDC MSMT LEADCHNL RA PACING THRESHOLD PULSEWIDTH: 0.4 ms
MDC IDC MSMT LEADCHNL RV IMPEDANCE VALUE: 360 Ohm
MDC IDC PG IMPLANT DT: 20120109
MDC IDC SESS DTM: 20190923060542
MDC IDC SET LEADCHNL RA PACING AMPLITUDE: 1.625
MDC IDC STAT BRADY AP VP PERCENT: 14 %
MDC IDC STAT BRADY RV PERCENT PACED: 34 %
Pulse Gen Model: 2210
Pulse Gen Serial Number: 7196227

## 2018-07-20 ENCOUNTER — Ambulatory Visit: Payer: Medicare Other | Admitting: *Deleted

## 2018-07-20 DIAGNOSIS — Z5181 Encounter for therapeutic drug level monitoring: Secondary | ICD-10-CM

## 2018-07-20 DIAGNOSIS — I4891 Unspecified atrial fibrillation: Secondary | ICD-10-CM

## 2018-07-20 DIAGNOSIS — G459 Transient cerebral ischemic attack, unspecified: Secondary | ICD-10-CM

## 2018-07-20 LAB — POCT INR: INR: 2.4 (ref 2.0–3.0)

## 2018-07-20 NOTE — Patient Instructions (Signed)
Continue coumadin 1 1/2 tablets daily except 2 tablets on Tuesdays Recheck in 6 weeks 

## 2018-08-29 ENCOUNTER — Ambulatory Visit: Payer: Medicare Other | Admitting: *Deleted

## 2018-08-29 DIAGNOSIS — Z5181 Encounter for therapeutic drug level monitoring: Secondary | ICD-10-CM

## 2018-08-29 DIAGNOSIS — I4891 Unspecified atrial fibrillation: Secondary | ICD-10-CM | POA: Diagnosis not present

## 2018-08-29 DIAGNOSIS — G459 Transient cerebral ischemic attack, unspecified: Secondary | ICD-10-CM | POA: Diagnosis not present

## 2018-08-29 LAB — POCT INR: INR: 2.3 (ref 2.0–3.0)

## 2018-08-29 NOTE — Patient Instructions (Signed)
Continue coumadin 1 1/2 tablets daily except 2 tablets on Tuesdays Recheck in 6 weeks 

## 2018-09-18 ENCOUNTER — Ambulatory Visit (INDEPENDENT_AMBULATORY_CARE_PROVIDER_SITE_OTHER): Payer: Medicare Other

## 2018-09-18 DIAGNOSIS — I4891 Unspecified atrial fibrillation: Secondary | ICD-10-CM | POA: Diagnosis not present

## 2018-09-18 DIAGNOSIS — I495 Sick sinus syndrome: Secondary | ICD-10-CM

## 2018-09-18 NOTE — Progress Notes (Signed)
Remote pacemaker transmission.   

## 2018-09-20 LAB — CUP PACEART REMOTE DEVICE CHECK
Battery Remaining Longevity: 24 mo
Battery Remaining Percentage: 22 %
Battery Voltage: 2.78 V
Brady Statistic AS VP Percent: 1.3 %
Brady Statistic RA Percent Paced: 52 %
Brady Statistic RV Percent Paced: 34 %
Implantable Lead Implant Date: 20120109
Implantable Lead Location: 753859
Implantable Pulse Generator Implant Date: 20120109
Lead Channel Impedance Value: 330 Ohm
Lead Channel Pacing Threshold Amplitude: 0.625 V
Lead Channel Pacing Threshold Pulse Width: 0.4 ms
Lead Channel Sensing Intrinsic Amplitude: 5 mV
Lead Channel Setting Pacing Amplitude: 1.625
Lead Channel Setting Sensing Sensitivity: 3 mV
MDC IDC LEAD IMPLANT DT: 20120109
MDC IDC LEAD LOCATION: 753860
MDC IDC MSMT LEADCHNL RV IMPEDANCE VALUE: 340 Ohm
MDC IDC MSMT LEADCHNL RV PACING THRESHOLD AMPLITUDE: 1 V
MDC IDC MSMT LEADCHNL RV PACING THRESHOLD PULSEWIDTH: 0.4 ms
MDC IDC MSMT LEADCHNL RV SENSING INTR AMPL: 12 mV
MDC IDC PG SERIAL: 7196227
MDC IDC SESS DTM: 20191223083458
MDC IDC SET LEADCHNL RV PACING AMPLITUDE: 2.5 V
MDC IDC SET LEADCHNL RV PACING PULSEWIDTH: 0.4 ms
MDC IDC STAT BRADY AP VP PERCENT: 15 %
MDC IDC STAT BRADY AP VS PERCENT: 83 %
MDC IDC STAT BRADY AS VS PERCENT: 1 %

## 2018-10-03 ENCOUNTER — Other Ambulatory Visit: Payer: Self-pay | Admitting: Internal Medicine

## 2018-10-10 ENCOUNTER — Ambulatory Visit: Payer: Medicare Other | Admitting: Pharmacist

## 2018-10-10 DIAGNOSIS — Z5181 Encounter for therapeutic drug level monitoring: Secondary | ICD-10-CM | POA: Diagnosis not present

## 2018-10-10 DIAGNOSIS — I4891 Unspecified atrial fibrillation: Secondary | ICD-10-CM

## 2018-10-10 DIAGNOSIS — G459 Transient cerebral ischemic attack, unspecified: Secondary | ICD-10-CM

## 2018-10-10 LAB — POCT INR: INR: 2.4 (ref 2.0–3.0)

## 2018-10-10 NOTE — Patient Instructions (Signed)
Description   Continue coumadin 1 1/2 tablets daily except 2 tablets on Tuesdays Recheck in 6 weeks

## 2018-11-21 ENCOUNTER — Ambulatory Visit: Payer: Medicare Other | Admitting: *Deleted

## 2018-11-21 DIAGNOSIS — G459 Transient cerebral ischemic attack, unspecified: Secondary | ICD-10-CM

## 2018-11-21 DIAGNOSIS — I4891 Unspecified atrial fibrillation: Secondary | ICD-10-CM

## 2018-11-21 DIAGNOSIS — Z5181 Encounter for therapeutic drug level monitoring: Secondary | ICD-10-CM | POA: Diagnosis not present

## 2018-11-21 LAB — POCT INR: INR: 2.8 (ref 2.0–3.0)

## 2018-11-21 NOTE — Patient Instructions (Signed)
Continue coumadin 1 1/2 tablets daily except 2 tablets on Tuesdays Recheck in 6 weeks 

## 2018-12-05 ENCOUNTER — Telehealth: Payer: Self-pay | Admitting: Internal Medicine

## 2018-12-05 DIAGNOSIS — I4891 Unspecified atrial fibrillation: Secondary | ICD-10-CM

## 2018-12-05 MED ORDER — DOFETILIDE 125 MCG PO CAPS: 500.0000 ug | ORAL_CAPSULE | Freq: Two times a day (BID) | ORAL | Status: DC

## 2018-12-05 NOTE — Telephone Encounter (Signed)
° ° °  Pt c/o medication issue:  1. Name of Medication: TIKOSYN 500 MCG capsule  2. How are you currently taking this medication (dosage and times per day)? n/a  3. Are you having a reaction (difficulty breathing--STAT)? no  4. What is your medication issue?  [facility] calling to report brand name of Tikosyn out of stock until 2021. Please advise

## 2018-12-05 NOTE — Telephone Encounter (Signed)
Spoke with [facility] regarding pt's Tikosyn refill Pt last received drug for 30 day suply on 11/01/2018  Attempted calling patient but phone number has been disconnected  Will order generic Tikosyn Note to pharmacy to ask pt to call and schedule an appt if they get in touch with him

## 2018-12-07 NOTE — Telephone Encounter (Signed)
Received call from [facility] stating they never received the generic tikosyn rx that was sent. Telephone refill given to pharmacist with instructions for patient to contact office for nurse visit/EKG one week of starting.

## 2018-12-18 ENCOUNTER — Other Ambulatory Visit (INDEPENDENT_AMBULATORY_CARE_PROVIDER_SITE_OTHER): Payer: Medicare Other

## 2018-12-18 ENCOUNTER — Ambulatory Visit (INDEPENDENT_AMBULATORY_CARE_PROVIDER_SITE_OTHER): Payer: Medicare Other | Admitting: *Deleted

## 2018-12-18 ENCOUNTER — Other Ambulatory Visit: Payer: Self-pay

## 2018-12-18 VITALS — BP 122/78 | HR 70 | Wt 183.0 lb

## 2018-12-18 DIAGNOSIS — I4891 Unspecified atrial fibrillation: Secondary | ICD-10-CM

## 2018-12-18 DIAGNOSIS — I495 Sick sinus syndrome: Secondary | ICD-10-CM | POA: Diagnosis not present

## 2018-12-18 NOTE — Progress Notes (Unsigned)
1.) Reason for visit: EKG, starting generic dofetilide    2.) Name of MD requesting visit: Dr. Lovena Le  3.) H&P: PAF,Tachybrady syndrome   4.) Assessment and plan per MD: The patient was asymptomatic during the office visit. Dr. Rayann Heman reviewed the EKG and stated the QT looked fine, but he was in afib.

## 2018-12-19 LAB — CUP PACEART REMOTE DEVICE CHECK
Battery Remaining Longevity: 22 mo
Battery Remaining Percentage: 19 %
Battery Voltage: 2.77 V
Brady Statistic AP VP Percent: 15 %
Brady Statistic AP VS Percent: 82 %
Brady Statistic AS VP Percent: 1.4 %
Brady Statistic AS VS Percent: 1 %
Brady Statistic RA Percent Paced: 49 %
Brady Statistic RV Percent Paced: 37 %
Date Time Interrogation Session: 20200323060006
Implantable Lead Implant Date: 20120109
Implantable Lead Location: 753859
Implantable Lead Location: 753860
Implantable Pulse Generator Implant Date: 20120109
Lead Channel Impedance Value: 340 Ohm
Lead Channel Impedance Value: 350 Ohm
Lead Channel Pacing Threshold Amplitude: 1 V
Lead Channel Pacing Threshold Pulse Width: 0.4 ms
Lead Channel Sensing Intrinsic Amplitude: 12 mV
Lead Channel Sensing Intrinsic Amplitude: 3.8 mV
Lead Channel Setting Pacing Amplitude: 1.625
Lead Channel Setting Pacing Pulse Width: 0.4 ms
Lead Channel Setting Sensing Sensitivity: 3 mV
MDC IDC LEAD IMPLANT DT: 20120109
MDC IDC MSMT LEADCHNL RA PACING THRESHOLD AMPLITUDE: 0.625 V
MDC IDC MSMT LEADCHNL RV PACING THRESHOLD PULSEWIDTH: 0.4 ms
MDC IDC SET LEADCHNL RV PACING AMPLITUDE: 2.5 V
Pulse Gen Model: 2210
Pulse Gen Serial Number: 7196227

## 2018-12-27 ENCOUNTER — Other Ambulatory Visit: Payer: Self-pay | Admitting: Internal Medicine

## 2018-12-27 NOTE — Progress Notes (Signed)
Remote pacemaker transmission.   

## 2019-01-02 ENCOUNTER — Telehealth: Payer: Self-pay | Admitting: *Deleted

## 2019-01-02 NOTE — Telephone Encounter (Signed)

## 2019-01-03 ENCOUNTER — Other Ambulatory Visit: Payer: Self-pay

## 2019-01-03 ENCOUNTER — Ambulatory Visit (INDEPENDENT_AMBULATORY_CARE_PROVIDER_SITE_OTHER): Payer: Medicare Other | Admitting: *Deleted

## 2019-01-03 DIAGNOSIS — I4891 Unspecified atrial fibrillation: Secondary | ICD-10-CM

## 2019-01-03 DIAGNOSIS — Z5181 Encounter for therapeutic drug level monitoring: Secondary | ICD-10-CM | POA: Diagnosis not present

## 2019-01-03 DIAGNOSIS — G459 Transient cerebral ischemic attack, unspecified: Secondary | ICD-10-CM | POA: Diagnosis not present

## 2019-01-03 LAB — POCT INR: INR: 3 (ref 2.0–3.0)

## 2019-01-03 NOTE — Patient Instructions (Signed)
Continue coumadin 1 1/2 tablets daily except 2 tablets on Tuesdays Recheck in 6 weeks

## 2019-02-05 ENCOUNTER — Other Ambulatory Visit: Payer: Self-pay | Admitting: Internal Medicine

## 2019-02-14 ENCOUNTER — Telehealth: Payer: Self-pay | Admitting: *Deleted

## 2019-02-14 NOTE — Telephone Encounter (Signed)
° ° °  COVID-19 Pre-Screening Questions: ° °• In the past 7 to 10 days have you had a cough,  shortness of breath, headache, congestion, fever, body aches, chills, sore throat, or sudden loss of taste or sense of smell?  NO °• Have you been around anyone with known Covid 19.  NO °• Have you been around anyone who is awaiting Covid 19 test results in the past 7 to 10 days?  NO °• Have you been around anyone who has been exposed to Covid 19, or has mentioned symptoms of Covid 19 within the past 7 to 10 days?  NO ° °If you have any concerns/questions about symptoms patients report during screening (either on the phone or at threshold). Contact the provider seeing the patient or DOD for further guidance.  If neither are available contact a member of the leadership team. ° ° ° °   ° ° ° ° °

## 2019-02-20 ENCOUNTER — Ambulatory Visit (INDEPENDENT_AMBULATORY_CARE_PROVIDER_SITE_OTHER): Payer: Medicare Other | Admitting: *Deleted

## 2019-02-20 ENCOUNTER — Other Ambulatory Visit: Payer: Self-pay

## 2019-02-20 DIAGNOSIS — I4891 Unspecified atrial fibrillation: Secondary | ICD-10-CM

## 2019-02-20 DIAGNOSIS — G459 Transient cerebral ischemic attack, unspecified: Secondary | ICD-10-CM

## 2019-02-20 DIAGNOSIS — Z5181 Encounter for therapeutic drug level monitoring: Secondary | ICD-10-CM | POA: Diagnosis not present

## 2019-02-20 LAB — POCT INR: INR: 2.3 (ref 2.0–3.0)

## 2019-02-20 NOTE — Patient Instructions (Signed)
Continue coumadin 1 1/2 tablets daily except 2 tablets on Tuesdays Recheck in 6 weeks

## 2019-03-19 ENCOUNTER — Ambulatory Visit (INDEPENDENT_AMBULATORY_CARE_PROVIDER_SITE_OTHER): Payer: Medicare Other | Admitting: *Deleted

## 2019-03-19 DIAGNOSIS — I495 Sick sinus syndrome: Secondary | ICD-10-CM | POA: Diagnosis not present

## 2019-03-19 DIAGNOSIS — R55 Syncope and collapse: Secondary | ICD-10-CM

## 2019-03-20 ENCOUNTER — Telehealth: Payer: Self-pay

## 2019-03-20 NOTE — Telephone Encounter (Signed)
Left message for patient to remind of missed remote transmission.  

## 2019-03-25 LAB — CUP PACEART REMOTE DEVICE CHECK
Battery Remaining Longevity: 12 mo
Battery Remaining Percentage: 10 %
Battery Voltage: 2.71 V
Brady Statistic AP VP Percent: 16 %
Brady Statistic AP VS Percent: 82 %
Brady Statistic AS VP Percent: 1.5 %
Brady Statistic AS VS Percent: 1 %
Brady Statistic RA Percent Paced: 46 %
Brady Statistic RV Percent Paced: 40 %
Date Time Interrogation Session: 20200628014243
Implantable Lead Implant Date: 20120109
Implantable Lead Implant Date: 20120109
Implantable Lead Location: 753859
Implantable Lead Location: 753860
Implantable Pulse Generator Implant Date: 20120109
Lead Channel Impedance Value: 330 Ohm
Lead Channel Impedance Value: 330 Ohm
Lead Channel Pacing Threshold Amplitude: 0.625 V
Lead Channel Pacing Threshold Amplitude: 1 V
Lead Channel Pacing Threshold Pulse Width: 0.4 ms
Lead Channel Pacing Threshold Pulse Width: 0.4 ms
Lead Channel Sensing Intrinsic Amplitude: 12 mV
Lead Channel Sensing Intrinsic Amplitude: 3 mV
Lead Channel Setting Pacing Amplitude: 1.625
Lead Channel Setting Pacing Amplitude: 2.5 V
Lead Channel Setting Pacing Pulse Width: 0.4 ms
Lead Channel Setting Sensing Sensitivity: 3 mV
Pulse Gen Model: 2210
Pulse Gen Serial Number: 7196227

## 2019-03-29 NOTE — Progress Notes (Signed)
Remote pacemaker transmission.   

## 2019-04-03 ENCOUNTER — Ambulatory Visit (INDEPENDENT_AMBULATORY_CARE_PROVIDER_SITE_OTHER): Payer: Medicare Other | Admitting: *Deleted

## 2019-04-03 DIAGNOSIS — Z5181 Encounter for therapeutic drug level monitoring: Secondary | ICD-10-CM | POA: Diagnosis not present

## 2019-04-03 DIAGNOSIS — I4891 Unspecified atrial fibrillation: Secondary | ICD-10-CM

## 2019-04-03 DIAGNOSIS — G459 Transient cerebral ischemic attack, unspecified: Secondary | ICD-10-CM | POA: Diagnosis not present

## 2019-04-03 LAB — POCT INR: INR: 2.9 (ref 2.0–3.0)

## 2019-04-03 NOTE — Patient Instructions (Signed)
Continue coumadin 1 1/2 tablets daily except 2 tablets on Tuesdays Recheck in 6 weeks

## 2019-04-11 ENCOUNTER — Telehealth: Payer: Self-pay | Admitting: Internal Medicine

## 2019-04-11 NOTE — Telephone Encounter (Signed)

## 2019-04-12 ENCOUNTER — Encounter: Payer: Self-pay | Admitting: Internal Medicine

## 2019-04-12 ENCOUNTER — Other Ambulatory Visit: Payer: Self-pay

## 2019-04-12 ENCOUNTER — Ambulatory Visit (INDEPENDENT_AMBULATORY_CARE_PROVIDER_SITE_OTHER): Payer: Medicare Other | Admitting: Internal Medicine

## 2019-04-12 VITALS — BP 120/84 | HR 71 | Ht 70.0 in | Wt 181.0 lb

## 2019-04-12 DIAGNOSIS — I480 Paroxysmal atrial fibrillation: Secondary | ICD-10-CM | POA: Diagnosis not present

## 2019-04-12 DIAGNOSIS — I10 Essential (primary) hypertension: Secondary | ICD-10-CM

## 2019-04-12 DIAGNOSIS — Z950 Presence of cardiac pacemaker: Secondary | ICD-10-CM | POA: Diagnosis not present

## 2019-04-12 DIAGNOSIS — I495 Sick sinus syndrome: Secondary | ICD-10-CM

## 2019-04-12 LAB — CUP PACEART INCLINIC DEVICE CHECK
Battery Remaining Longevity: 14 mo
Battery Voltage: 2.72 V
Brady Statistic RA Percent Paced: 46 %
Brady Statistic RV Percent Paced: 40 %
Date Time Interrogation Session: 20200716163506
Implantable Lead Implant Date: 20120109
Implantable Lead Implant Date: 20120109
Implantable Lead Location: 753859
Implantable Lead Location: 753860
Implantable Pulse Generator Implant Date: 20120109
Lead Channel Impedance Value: 362.5 Ohm
Lead Channel Impedance Value: 362.5 Ohm
Lead Channel Pacing Threshold Amplitude: 0.5 V
Lead Channel Pacing Threshold Amplitude: 1.25 V
Lead Channel Pacing Threshold Amplitude: 1.25 V
Lead Channel Pacing Threshold Pulse Width: 0.4 ms
Lead Channel Pacing Threshold Pulse Width: 0.4 ms
Lead Channel Pacing Threshold Pulse Width: 0.4 ms
Lead Channel Sensing Intrinsic Amplitude: 12 mV
Lead Channel Sensing Intrinsic Amplitude: 4 mV
Lead Channel Setting Pacing Amplitude: 1.5 V
Lead Channel Setting Pacing Amplitude: 2.5 V
Lead Channel Setting Pacing Pulse Width: 0.4 ms
Lead Channel Setting Sensing Sensitivity: 3 mV
Pulse Gen Model: 2210
Pulse Gen Serial Number: 7196227

## 2019-04-12 NOTE — Patient Instructions (Addendum)
Medication Instructions:  Your physician recommends that you continue on your current medications as directed. Please refer to the Current Medication list given to you today.  Labwork: None ordered.  Testing/Procedures: None ordered.  Follow-Up: Your physician wants you to follow-up in: one year with Dr. Lovena Le.   You will receive a reminder letter in the mail two months in advance. If you don't receive a letter, please call our office to schedule the follow-up appointment.  Remote monitoring is used to monitor your Pacemaker from home. This monitoring reduces the number of office visits required to check your device to one time per year. It allows [place] to keep an eye on the functioning of your device to ensure it is working properly. You are scheduled for a device check from home on 06/19/2019. You may send your transmission at any time that day. If you have a wireless device, the transmission will be sent automatically. After your physician reviews your transmission, you will receive a postcard with your next transmission date.  Any Other Special Instructions Will Be Listed Below (If Applicable).  If you need a refill on your cardiac medications before your next appointment, please call your pharmacy.

## 2019-04-12 NOTE — Progress Notes (Signed)
HPI [name] returns today for ongoing evaluation and management of PAF, sinus node dysfunction s/p PPM insertion. The patient has had a minimal increase in his symptoms of palpitations. He remains on dofetilide therapy. He denies chest pain or sob. No syncope.  Allergies  Allergen Reactions  . Sulfonamide Derivatives Other (See Comments)    Breathing trouble   . Codeine Nausea And Vomiting  . Contrast Media [Iodinated Diagnostic Agents] Hives     Current Outpatient Medications  Medication Sig Dispense Refill  . dofetilide (TIKOSYN) 500 MCG capsule TAKE ONE CAPSULE BY MOUTH TWICE DAILY - PT MUST SCHEDULE APPOINTMENT FOR EKG AFTER STARTING GENERIC 180 capsule 1  . gabapentin (NEURONTIN) 300 MG capsule Take 300 mg by mouth 3 (three) times daily.     . metoprolol tartrate (LOPRESSOR) 25 MG tablet TAKE 1 TABLET BY MOUTH TWICE DAILY 60 tablet 11  . ranitidine (ZANTAC) 300 MG tablet Take 1 tablet by mouth daily as needed. Acid reflux or heartburn    . tadalafil (CIALIS) 5 MG tablet Take 5 mg by mouth daily as needed. Take for urine function    . testosterone cypionate (DEPOTESTOTERONE CYPIONATE) 200 MG/ML injection Inject 10 mg into the muscle every 14 (fourteen) days.     Marland Kitchen warfarin (COUMADIN) 5 MG tablet TAKE AS DIRECTED BY COUMADIN CLINIC 50 tablet 3   Current Facility-Administered Medications  Medication Dose Route Frequency Provider Last Rate Last Dose  . dofetilide (TIKOSYN) capsule 500 mcg  500 mcg Oral BID Evans Lance, MD         Past Medical History:  Diagnosis Date  . AF (paroxysmal atrial fibrillation) ([place])   . Anxiety   . CHEST PAIN UNSPECIFIED   . Complication of anesthesia   . Depression   . DIZZINESS   . Dysrhythmia    hx of atrial fibrilation  . ERYTHROCYTOSIS   . Essential hypertension, benign   . GERD (gastroesophageal reflux disease)   . Melanoma ([place])   . NUMBNESS   . Other malaise and fatigue   . Pacemaker ? 2012  . Painful respiration   .  PONV (postoperative nausea and vomiting)   . SICK SINUS SYNDROME   . TIA   . Visual loss    intermittent    ROS:   All systems reviewed and negative except as noted in the HPI.   Past Surgical History:  Procedure Laterality Date  . CHOLECYSTECTOMY    . COLONOSCOPY  2013  . INSERT / REPLACE / REMOVE PACEMAKER    . LYMPH NODE BIOPSY Left   . PACEMAKER INSERTION    . skin cancer removal    . VASECTOMY       Family History  Problem Relation Age of Onset  . Prostate cancer Father      Social History   Socioeconomic History  . Marital status: Married    Spouse name: Not on file  . Number of children: Not on file  . Years of education: Not on file  . Highest education level: Not on file  Occupational History  . Not on file  Social Needs  . Financial resource strain: Not on file  . Food insecurity    Worry: Not on file    Inability: Not on file  . Transportation needs    Medical: Not on file    Non-medical: Not on file  Tobacco Use  . Smoking status: Former Research scientist (life sciences)  . Smokeless tobacco: Never Used  .  Tobacco comment: quit in 1976  Substance and Sexual Activity  . Alcohol use: No  . Drug use: No  . Sexual activity: Not on file  Lifestyle  . Physical activity    Days per week: Not on file    Minutes per session: Not on file  . Stress: Not on file  Relationships  . Social Herbalist on phone: Not on file    Gets together: Not on file    Attends religious service: Not on file    Active member of club or organization: Not on file    Attends meetings of clubs or organizations: Not on file    Relationship status: Not on file  . Intimate partner violence    Fear of current or ex partner: Not on file    Emotionally abused: Not on file    Physically abused: Not on file    Forced sexual activity: Not on file  Other Topics Concern  . Not on file  Social History Narrative  . Not on file     BP 120/84   Pulse 71   Ht 5\' 10"  (1.778 m)   Wt 181 lb  (82.1 kg)   SpO2 98%   BMI 25.97 kg/m   Physical Exam:  Well appearing NAD HEENT: Unremarkable Neck:  No JVD, no thyromegally Lymphatics:  No adenopathy Back:  No CVA tenderness Lungs:  Clear with no wheezes HEART:  Regular rate rhythm, no murmurs, no rubs, no clicks Abd:  soft, positive bowel sounds, no organomegally, no rebound, no guarding Ext:  2 plus pulses, no edema, no cyanosis, no clubbing Skin:  No rashes no nodules Neuro:  CN II through XII intact, motor grossly intact  EKG - atrial fib with a controlled VR and ventricular pacing  DEVICE  Normal device function.  See PaceArt for details.   Assess/Plan: 1. PAF - he is s/p ablation and will continue with dofetilide. His device interrogation demonstrates that he is in atrial fib about 10% of the time.  2. Sinus node dysfunction - he is asymptomatic, s/p PPM insertion. He is pacing about 50% of the time. 3. HTN - his blood pressure is well controlled on medical therapy.   Joshua [name].D.

## 2019-04-19 ENCOUNTER — Other Ambulatory Visit: Payer: Self-pay | Admitting: Internal Medicine

## 2019-05-14 ENCOUNTER — Telehealth: Payer: Self-pay | Admitting: Cardiovascular Disease

## 2019-05-14 NOTE — Telephone Encounter (Signed)

## 2019-05-15 ENCOUNTER — Other Ambulatory Visit: Payer: Self-pay

## 2019-05-15 ENCOUNTER — Ambulatory Visit: Payer: Medicare Other

## 2019-05-15 DIAGNOSIS — I4891 Unspecified atrial fibrillation: Secondary | ICD-10-CM

## 2019-05-15 DIAGNOSIS — G459 Transient cerebral ischemic attack, unspecified: Secondary | ICD-10-CM | POA: Diagnosis not present

## 2019-05-15 DIAGNOSIS — Z5181 Encounter for therapeutic drug level monitoring: Secondary | ICD-10-CM | POA: Diagnosis not present

## 2019-05-15 LAB — POCT INR: INR: 2.6 (ref 2.0–3.0)

## 2019-05-15 NOTE — Patient Instructions (Signed)
Description   Continue coumadin 1.5 tablets daily except 2 tablets on Tuesdays. Recheck in 6 weeks

## 2019-06-13 ENCOUNTER — Other Ambulatory Visit: Payer: Self-pay | Admitting: Internal Medicine

## 2019-06-19 ENCOUNTER — Ambulatory Visit (INDEPENDENT_AMBULATORY_CARE_PROVIDER_SITE_OTHER): Payer: Medicare Other | Admitting: *Deleted

## 2019-06-19 DIAGNOSIS — I480 Paroxysmal atrial fibrillation: Secondary | ICD-10-CM

## 2019-06-19 DIAGNOSIS — I495 Sick sinus syndrome: Secondary | ICD-10-CM

## 2019-06-22 LAB — CUP PACEART REMOTE DEVICE CHECK
Battery Remaining Longevity: 7 mo
Battery Remaining Percentage: 6 %
Battery Voltage: 2.68 V
Brady Statistic AP VP Percent: 18 %
Brady Statistic AP VS Percent: 78 %
Brady Statistic AS VP Percent: 2.2 %
Brady Statistic AS VS Percent: 1.3 %
Brady Statistic RA Percent Paced: 35 %
Brady Statistic RV Percent Paced: 51 %
Date Time Interrogation Session: 20200925001504
Implantable Lead Implant Date: 20120109
Implantable Lead Implant Date: 20120109
Implantable Lead Location: 753859
Implantable Lead Location: 753860
Implantable Pulse Generator Implant Date: 20120109
Lead Channel Impedance Value: 330 Ohm
Lead Channel Impedance Value: 340 Ohm
Lead Channel Pacing Threshold Amplitude: 0.625 V
Lead Channel Pacing Threshold Amplitude: 1.25 V
Lead Channel Pacing Threshold Pulse Width: 0.4 ms
Lead Channel Pacing Threshold Pulse Width: 0.4 ms
Lead Channel Sensing Intrinsic Amplitude: 12 mV
Lead Channel Sensing Intrinsic Amplitude: 3.7 mV
Lead Channel Setting Pacing Amplitude: 1.625
Lead Channel Setting Pacing Amplitude: 2.5 V
Lead Channel Setting Pacing Pulse Width: 0.4 ms
Lead Channel Setting Sensing Sensitivity: 3 mV
Pulse Gen Model: 2210
Pulse Gen Serial Number: 7196227

## 2019-06-23 ENCOUNTER — Other Ambulatory Visit: Payer: Self-pay | Admitting: Internal Medicine

## 2019-06-26 ENCOUNTER — Other Ambulatory Visit: Payer: Self-pay

## 2019-06-26 ENCOUNTER — Ambulatory Visit: Payer: Medicare Other | Admitting: Pharmacist

## 2019-06-26 DIAGNOSIS — I4891 Unspecified atrial fibrillation: Secondary | ICD-10-CM | POA: Diagnosis not present

## 2019-06-26 DIAGNOSIS — Z5181 Encounter for therapeutic drug level monitoring: Secondary | ICD-10-CM | POA: Diagnosis not present

## 2019-06-26 DIAGNOSIS — G459 Transient cerebral ischemic attack, unspecified: Secondary | ICD-10-CM | POA: Diagnosis not present

## 2019-06-26 LAB — POCT INR: INR: 2.5 (ref 2.0–3.0)

## 2019-06-26 NOTE — Patient Instructions (Signed)
Description   Continue coumadin 1.5 tablets daily except 2 tablets on Tuesdays. Recheck in 4 weeks - 2 days before nose surgery. INR needs to be < 2.6 per MD for 10/28 procedure.

## 2019-07-02 NOTE — Progress Notes (Signed)
Remote pacemaker transmission.   

## 2019-07-23 ENCOUNTER — Ambulatory Visit (INDEPENDENT_AMBULATORY_CARE_PROVIDER_SITE_OTHER): Payer: Medicare Other | Admitting: *Deleted

## 2019-07-23 ENCOUNTER — Other Ambulatory Visit: Payer: Self-pay

## 2019-07-23 DIAGNOSIS — G459 Transient cerebral ischemic attack, unspecified: Secondary | ICD-10-CM

## 2019-07-23 DIAGNOSIS — Z5181 Encounter for therapeutic drug level monitoring: Secondary | ICD-10-CM | POA: Diagnosis not present

## 2019-07-23 DIAGNOSIS — I4891 Unspecified atrial fibrillation: Secondary | ICD-10-CM | POA: Diagnosis not present

## 2019-07-23 LAB — POCT INR: INR: 3.2 — AB (ref 2.0–3.0)

## 2019-07-23 NOTE — Patient Instructions (Signed)
Hold coumadin tonight and recheck tomorrow. 10/28 nose surgery. INR needs to be < 2.6 per MD for procedure.

## 2019-07-24 ENCOUNTER — Ambulatory Visit (INDEPENDENT_AMBULATORY_CARE_PROVIDER_SITE_OTHER): Payer: Medicare Other | Admitting: *Deleted

## 2019-07-24 DIAGNOSIS — G459 Transient cerebral ischemic attack, unspecified: Secondary | ICD-10-CM

## 2019-07-24 DIAGNOSIS — Z5181 Encounter for therapeutic drug level monitoring: Secondary | ICD-10-CM | POA: Diagnosis not present

## 2019-07-24 DIAGNOSIS — I4891 Unspecified atrial fibrillation: Secondary | ICD-10-CM

## 2019-07-24 LAB — POCT INR: INR: 2.4 (ref 2.0–3.0)

## 2019-07-24 NOTE — Patient Instructions (Signed)
Take warfarin 1 tablet tonight then resume 1 1/2 tablets daily except 2 tablets on Tuesdays tomorrow. 10/28 nose surgery. INR needs to be < 2.6 per MD for procedure.

## 2019-08-06 ENCOUNTER — Ambulatory Visit: Payer: Medicare Other | Admitting: *Deleted

## 2019-08-06 ENCOUNTER — Other Ambulatory Visit: Payer: Self-pay

## 2019-08-06 DIAGNOSIS — Z5181 Encounter for therapeutic drug level monitoring: Secondary | ICD-10-CM | POA: Diagnosis not present

## 2019-08-06 DIAGNOSIS — G459 Transient cerebral ischemic attack, unspecified: Secondary | ICD-10-CM | POA: Diagnosis not present

## 2019-08-06 DIAGNOSIS — I4891 Unspecified atrial fibrillation: Secondary | ICD-10-CM

## 2019-08-06 LAB — POCT INR: INR: 3 (ref 2.0–3.0)

## 2019-08-06 NOTE — Patient Instructions (Signed)
Continue warfarin 1 1/2 tablets daily except 2 tablets on Tuesdays. Increase Vit K foods a little Recheck in 3 weeks

## 2019-08-27 ENCOUNTER — Ambulatory Visit: Payer: Medicare Other | Admitting: *Deleted

## 2019-08-27 ENCOUNTER — Other Ambulatory Visit: Payer: Self-pay

## 2019-08-27 DIAGNOSIS — G459 Transient cerebral ischemic attack, unspecified: Secondary | ICD-10-CM

## 2019-08-27 DIAGNOSIS — I4891 Unspecified atrial fibrillation: Secondary | ICD-10-CM | POA: Diagnosis not present

## 2019-08-27 DIAGNOSIS — Z5181 Encounter for therapeutic drug level monitoring: Secondary | ICD-10-CM

## 2019-08-27 LAB — POCT INR: INR: 2.7 (ref 2.0–3.0)

## 2019-08-27 NOTE — Patient Instructions (Signed)
Continue warfarin 1 1/2 tablets daily except 2 tablets on Tuesdays. Continue Vit K foods Recheck in 5 weeks

## 2019-09-18 ENCOUNTER — Ambulatory Visit (INDEPENDENT_AMBULATORY_CARE_PROVIDER_SITE_OTHER): Payer: Medicare Other | Admitting: *Deleted

## 2019-09-18 DIAGNOSIS — Z950 Presence of cardiac pacemaker: Secondary | ICD-10-CM | POA: Diagnosis not present

## 2019-09-18 LAB — CUP PACEART REMOTE DEVICE CHECK
Battery Remaining Longevity: 1 mo
Battery Remaining Percentage: 0.5 %
Battery Voltage: 2.62 V
Brady Statistic AP VP Percent: 16 %
Brady Statistic AP VS Percent: 81 %
Brady Statistic AS VP Percent: 1.8 %
Brady Statistic AS VS Percent: 1.1 %
Brady Statistic RA Percent Paced: 33 %
Brady Statistic RV Percent Paced: 50 %
Date Time Interrogation Session: 20201222034805
Implantable Lead Implant Date: 20120109
Implantable Lead Implant Date: 20120109
Implantable Lead Location: 753859
Implantable Lead Location: 753860
Implantable Pulse Generator Implant Date: 20120109
Lead Channel Impedance Value: 330 Ohm
Lead Channel Impedance Value: 340 Ohm
Lead Channel Pacing Threshold Amplitude: 0.5 V
Lead Channel Pacing Threshold Amplitude: 1.25 V
Lead Channel Pacing Threshold Pulse Width: 0.4 ms
Lead Channel Pacing Threshold Pulse Width: 0.4 ms
Lead Channel Sensing Intrinsic Amplitude: 12 mV
Lead Channel Sensing Intrinsic Amplitude: 4.4 mV
Lead Channel Setting Pacing Amplitude: 1.5 V
Lead Channel Setting Pacing Amplitude: 2.5 V
Lead Channel Setting Pacing Pulse Width: 0.4 ms
Lead Channel Setting Sensing Sensitivity: 3 mV
Pulse Gen Model: 2210
Pulse Gen Serial Number: 7196227

## 2019-10-01 ENCOUNTER — Other Ambulatory Visit: Payer: Self-pay

## 2019-10-01 ENCOUNTER — Ambulatory Visit: Payer: Medicare PPO | Admitting: *Deleted

## 2019-10-01 DIAGNOSIS — Z5181 Encounter for therapeutic drug level monitoring: Secondary | ICD-10-CM

## 2019-10-01 DIAGNOSIS — I4891 Unspecified atrial fibrillation: Secondary | ICD-10-CM

## 2019-10-01 DIAGNOSIS — G459 Transient cerebral ischemic attack, unspecified: Secondary | ICD-10-CM

## 2019-10-01 LAB — POCT INR: INR: 2.9 (ref 2.0–3.0)

## 2019-10-01 NOTE — Patient Instructions (Signed)
Continue warfarin 1 1/2 tablets daily except 2 tablets on Tuesdays. Continue Vit K foods Recheck in 6 weeks

## 2019-10-20 ENCOUNTER — Other Ambulatory Visit: Payer: Self-pay | Admitting: Internal Medicine

## 2019-10-22 ENCOUNTER — Telehealth: Payer: Self-pay | Admitting: Internal Medicine

## 2019-10-22 NOTE — Telephone Encounter (Signed)
New message   Pt calling requesting appt with Dr. Lovena Le. He said that for the last 5 days he has had pain next to his left rib cage and it spreads over to where his pacemaker is. He said it's a constant pain but sometimes if he gets into the right position it will ease off. He also said that when he stands up after sitting for a while he gets a sharp pain across that area. He is concerned because he looked it up and saw something about pericarditis. He is scheduled for 01.25.21 at 11:30.

## 2019-10-23 ENCOUNTER — Ambulatory Visit: Payer: Medicare PPO | Admitting: Internal Medicine

## 2019-10-23 ENCOUNTER — Encounter: Payer: Self-pay | Admitting: Internal Medicine

## 2019-10-23 ENCOUNTER — Other Ambulatory Visit: Payer: Self-pay

## 2019-10-23 VITALS — BP 134/88 | HR 70 | Ht 70.0 in | Wt 188.2 lb

## 2019-10-23 DIAGNOSIS — I495 Sick sinus syndrome: Secondary | ICD-10-CM

## 2019-10-23 DIAGNOSIS — I480 Paroxysmal atrial fibrillation: Secondary | ICD-10-CM | POA: Diagnosis not present

## 2019-10-23 DIAGNOSIS — Z950 Presence of cardiac pacemaker: Secondary | ICD-10-CM | POA: Diagnosis not present

## 2019-10-23 DIAGNOSIS — I10 Essential (primary) hypertension: Secondary | ICD-10-CM

## 2019-10-23 DIAGNOSIS — R079 Chest pain, unspecified: Secondary | ICD-10-CM

## 2019-10-23 LAB — BASIC METABOLIC PANEL
BUN/Creatinine Ratio: 11 (ref 10–24)
BUN: 14 mg/dL (ref 8–27)
CO2: 30 mmol/L — ABNORMAL HIGH (ref 20–29)
Calcium: 9.2 mg/dL (ref 8.6–10.2)
Chloride: 103 mmol/L (ref 96–106)
Creatinine, Ser: 1.32 mg/dL — ABNORMAL HIGH (ref 0.76–1.27)
GFR calc Af Amer: 60 mL/min/{1.73_m2} (ref >59)
GFR calc non Af Amer: 52 mL/min/{1.73_m2} — ABNORMAL LOW (ref >59)
Glucose: 100 mg/dL — ABNORMAL HIGH (ref 65–99)
Potassium: 4.6 mmol/L (ref 3.5–5.2)
Sodium: 139 mmol/L (ref 134–144)

## 2019-10-23 LAB — CBC WITH DIFFERENTIAL/PLATELET
Basophils Absolute: 0 10*3/uL (ref 0.0–0.2)
Basos: 0 %
EOS (ABSOLUTE): 0.2 10*3/uL (ref 0.0–0.4)
Eos: 2 %
Hematocrit: 49 % (ref 37.5–51.0)
Hemoglobin: 16.9 g/dL (ref 13.0–17.7)
Lymphocytes Absolute: 2.8 10*3/uL (ref 0.7–3.1)
Lymphs: 25 %
MCH: 31.1 pg (ref 26.6–33.0)
MCHC: 34.5 g/dL (ref 31.5–35.7)
MCV: 90 fL (ref 79–97)
Monocytes Absolute: 0.8 10*3/uL (ref 0.1–0.9)
Monocytes: 7 %
Neutrophils Absolute: 7.4 10*3/uL — ABNORMAL HIGH (ref 1.4–7.0)
Neutrophils: 66 %
Platelets: 243 10*3/uL (ref 150–450)
RBC: 5.43 x10E6/uL (ref 4.14–5.80)
RDW: 16.2 % — ABNORMAL HIGH (ref 11.6–15.4)
WBC: 11.3 10*3/uL — ABNORMAL HIGH (ref 3.4–10.8)

## 2019-10-23 LAB — SEDIMENTATION RATE: Sed Rate: 0 mm/hr (ref 0–30)

## 2019-10-23 LAB — PROTIME-INR
INR: 2.2 — ABNORMAL HIGH (ref 0.9–1.2)
Prothrombin Time: 23.1 s — ABNORMAL HIGH (ref 9.1–12.0)

## 2019-10-23 LAB — TROPONIN T: Troponin T TROPT: <0.011 ng/mL (ref <0.011)

## 2019-10-23 NOTE — Progress Notes (Signed)
    HPI [name] returns today for followup. He is a pleasant 78 yo man with a h/o atrial fib, on dofetilide, sinus node dysfunction, s/p PPM insertion. In the interim, he has experienced an episode of chest pain, which is pleuritic in nature, not associated with exertion and no sob. He denies fever or chills.  Allergies  Allergen Reactions  . Sulfonamide Derivatives Other (See Comments)    Breathing trouble   . Codeine Nausea And Vomiting  . Contrast Media [Iodinated Diagnostic Agents] Hives     Current Outpatient Medications  Medication Sig Dispense Refill  . dofetilide (TIKOSYN) 500 MCG capsule TAKE ONE CAPSULE BY MOUTH TWICE DAILY - PT MUST SCHEDULE APPOINTMENT FOR EKG AFTER STARTING GENERIC 180 capsule 2  . gabapentin (NEURONTIN) 300 MG capsule Take 300 mg by mouth 3 (three) times daily.     . metoprolol tartrate (LOPRESSOR) 25 MG tablet TAKE 1 TABLET BY MOUTH TWICE DAILY 60 tablet 11  . tadalafil (CIALIS) 5 MG tablet Take 5 mg by mouth daily as needed. Take for urine function    . testosterone cypionate (DEPOTESTOTERONE CYPIONATE) 200 MG/ML injection Inject 10 mg into the muscle every 14 (fourteen) days.     . warfarin (COUMADIN) 5 MG tablet TAKE AS DIRECTED BY COUMADIN CLINIC 50 tablet 3   Current Facility-Administered Medications  Medication Dose Route Frequency Provider Last Rate Last Admin  . dofetilide (TIKOSYN) capsule 500 mcg  500 mcg Oral BID Taylor, Gregg W, MD         Past Medical History:  Diagnosis Date  . AF (paroxysmal atrial fibrillation) ([facility])   . Anxiety   . CHEST PAIN UNSPECIFIED   . Complication of anesthesia   . Depression   . DIZZINESS   . Dysrhythmia    hx of atrial fibrilation  . ERYTHROCYTOSIS   . Essential hypertension, benign   . GERD (gastroesophageal reflux disease)   . Melanoma ([facility])   . NUMBNESS   . Other malaise and fatigue   . Pacemaker ? 2012  . Painful respiration   . PONV (postoperative nausea and vomiting)   . SICK SINUS  SYNDROME   . TIA   . Visual loss    intermittent    ROS:   All systems reviewed and negative except as noted in the HPI.   Past Surgical History:  Procedure Laterality Date  . CHOLECYSTECTOMY    . COLONOSCOPY  2013  . INSERT / REPLACE / REMOVE PACEMAKER    . LYMPH NODE BIOPSY Left   . PACEMAKER INSERTION    . skin cancer removal    . VASECTOMY       Family History  Problem Relation Age of Onset  . Prostate cancer Father      Social History   Socioeconomic History  . Marital status: Married    Spouse name: Not on file  . Number of children: Not on file  . Years of education: Not on file  . Highest education level: Not on file  Occupational History  . Not on file  Tobacco Use  . Smoking status: Former Smoker  . Smokeless tobacco: Never Used  . Tobacco comment: quit in 1976  Substance and Sexual Activity  . Alcohol use: No  . Drug use: No  . Sexual activity: Not on file  Other Topics Concern  . Not on file  Social History Narrative  . Not on file   Social Determinants of Health   Financial Resource Strain:   .   Difficulty of Paying Living Expenses: Not on file  Food Insecurity:   . Worried About Running Out of Food in the Last Year: Not on file  . Ran Out of Food in the Last Year: Not on file  Transportation Needs:   . Lack of Transportation (Medical): Not on file  . Lack of Transportation (Non-Medical): Not on file  Physical Activity:   . Days of Exercise per Week: Not on file  . Minutes of Exercise per Session: Not on file  Stress:   . Feeling of Stress : Not on file  Social Connections:   . Frequency of Communication with Friends and Family: Not on file  . Frequency of Social Gatherings with Friends and Family: Not on file  . Attends Religious Services: Not on file  . Active Member of Clubs or Organizations: Not on file  . Attends Club or Organization Meetings: Not on file  . Marital Status: Not on file  Intimate Partner Violence:   . Fear of  Current or Ex-Partner: Not on file  . Emotionally Abused: Not on file  . Physically Abused: Not on file  . Sexually Abused: Not on file     BP 134/88   Pulse 70   Ht 5' 10" (1.778 m)   Wt 188 lb 3.2 oz (85.4 kg)   SpO2 97%   BMI 27.00 kg/m   Physical Exam:  Well appearing NAD HEENT: Unremarkable Neck:  No JVD, no thyromegally Lymphatics:  No adenopathy Back:  No CVA tenderness Lungs:  Clear with no wheezes HEART:  Regular rate rhythm, no murmurs, no rubs, no clicks Abd:  soft, positive bowel sounds, no organomegally, no rebound, no guarding Ext:  2 plus pulses, no edema, no cyanosis, no clubbing Skin:  No rashes no nodules Neuro:  CN II through XII intact, motor grossly intact  EKG - atrial fib/flutter with ventricular pacing  DEVICE  Normal device function.  See PaceArt for details. [facility].  Assess/Plan: 1. PAF/Persistent atrial fib- he is having additional atrial fib and this is despite dofetilide. I have discussed the options for treatment and recommended he continue dofetilide for now. 2. PPM- his St. Jude device is at [facility], and we will schedule a PPM gen change out in the next few weeks. 3. Chest pain - I suspect that his symptoms are related to pleurisy, possibly from a viral infection. We will check labs today including ESR/troponin. 4. HTN - his bp is fairly well controlled. He will continue his current meds.  Gregg Taylor,M.D.  

## 2019-10-23 NOTE — H&P (View-Only) (Signed)
    HPI [name] returns today for followup. He is a pleasant 79 yo man with a h/o atrial fib, on dofetilide, sinus node dysfunction, s/p PPM insertion. In the interim, he has experienced an episode of chest pain, which is pleuritic in nature, not associated with exertion and no sob. He denies fever or chills.  Allergies  Allergen Reactions  . Sulfonamide Derivatives Other (See Comments)    Breathing trouble   . Codeine Nausea And Vomiting  . Contrast Media [Iodinated Diagnostic Agents] Hives     Current Outpatient Medications  Medication Sig Dispense Refill  . dofetilide (TIKOSYN) 500 MCG capsule TAKE ONE CAPSULE BY MOUTH TWICE DAILY - PT MUST SCHEDULE APPOINTMENT FOR EKG AFTER STARTING GENERIC 180 capsule 2  . gabapentin (NEURONTIN) 300 MG capsule Take 300 mg by mouth 3 (three) times daily.     . metoprolol tartrate (LOPRESSOR) 25 MG tablet TAKE 1 TABLET BY MOUTH TWICE DAILY 60 tablet 11  . tadalafil (CIALIS) 5 MG tablet Take 5 mg by mouth daily as needed. Take for urine function    . testosterone cypionate (DEPOTESTOTERONE CYPIONATE) 200 MG/ML injection Inject 10 mg into the muscle every 14 (fourteen) days.     . warfarin (COUMADIN) 5 MG tablet TAKE AS DIRECTED BY COUMADIN CLINIC 50 tablet 3   Current Facility-Administered Medications  Medication Dose Route Frequency Provider Last Rate Last Admin  . dofetilide (TIKOSYN) capsule 500 mcg  500 mcg Oral BID Jonh Mcqueary W, MD         Past Medical History:  Diagnosis Date  . AF (paroxysmal atrial fibrillation) ([facility])   . Anxiety   . CHEST PAIN UNSPECIFIED   . Complication of anesthesia   . Depression   . DIZZINESS   . Dysrhythmia    hx of atrial fibrilation  . ERYTHROCYTOSIS   . Essential hypertension, benign   . GERD (gastroesophageal reflux disease)   . Melanoma ([facility])   . NUMBNESS   . Other malaise and fatigue   . Pacemaker ? 2012  . Painful respiration   . PONV (postoperative nausea and vomiting)   . SICK SINUS  SYNDROME   . TIA   . Visual loss    intermittent    ROS:   All systems reviewed and negative except as noted in the HPI.   Past Surgical History:  Procedure Laterality Date  . CHOLECYSTECTOMY    . COLONOSCOPY  2013  . INSERT / REPLACE / REMOVE PACEMAKER    . LYMPH NODE BIOPSY Left   . PACEMAKER INSERTION    . skin cancer removal    . VASECTOMY       Family History  Problem Relation Age of Onset  . Prostate cancer Father      Social History   Socioeconomic History  . Marital status: Married    Spouse name: Not on file  . Number of children: Not on file  . Years of education: Not on file  . Highest education level: Not on file  Occupational History  . Not on file  Tobacco Use  . Smoking status: Former Smoker  . Smokeless tobacco: Never Used  . Tobacco comment: quit in 1976  Substance and Sexual Activity  . Alcohol use: No  . Drug use: No  . Sexual activity: Not on file  Other Topics Concern  . Not on file  Social History Narrative  . Not on file   Social Determinants of Health   Financial Resource Strain:   .   Difficulty of Paying Living Expenses: Not on file  Food Insecurity:   . Worried About Running Out of Food in the Last Year: Not on file  . Ran Out of Food in the Last Year: Not on file  Transportation Needs:   . Lack of Transportation (Medical): Not on file  . Lack of Transportation (Non-Medical): Not on file  Physical Activity:   . Days of Exercise per Week: Not on file  . Minutes of Exercise per Session: Not on file  Stress:   . Feeling of Stress : Not on file  Social Connections:   . Frequency of Communication with Friends and Family: Not on file  . Frequency of Social Gatherings with Friends and Family: Not on file  . Attends Religious Services: Not on file  . Active Member of Clubs or Organizations: Not on file  . Attends Club or Organization Meetings: Not on file  . Marital Status: Not on file  Intimate Partner Violence:   . Fear of  Current or Ex-Partner: Not on file  . Emotionally Abused: Not on file  . Physically Abused: Not on file  . Sexually Abused: Not on file     BP 134/88   Pulse 70   Ht 5' 10" (1.778 m)   Wt 188 lb 3.2 oz (85.4 kg)   SpO2 97%   BMI 27.00 kg/m   Physical Exam:  Well appearing NAD HEENT: Unremarkable Neck:  No JVD, no thyromegally Lymphatics:  No adenopathy Back:  No CVA tenderness Lungs:  Clear with no wheezes HEART:  Regular rate rhythm, no murmurs, no rubs, no clicks Abd:  soft, positive bowel sounds, no organomegally, no rebound, no guarding Ext:  2 plus pulses, no edema, no cyanosis, no clubbing Skin:  No rashes no nodules Neuro:  CN II through XII intact, motor grossly intact  EKG - atrial fib/flutter with ventricular pacing  DEVICE  Normal device function.  See PaceArt for details. [facility].  Assess/Plan: 1. PAF/Persistent atrial fib- he is having additional atrial fib and this is despite dofetilide. I have discussed the options for treatment and recommended he continue dofetilide for now. 2. PPM- his St. Jude device is at [facility], and we will schedule a PPM gen change out in the next few weeks. 3. Chest pain - I suspect that his symptoms are related to pleurisy, possibly from a viral infection. We will check labs today including ESR/troponin. 4. HTN - his bp is fairly well controlled. He will continue his current meds.  Charnele Semple,M.D.  

## 2019-10-23 NOTE — Patient Instructions (Addendum)
Medication Instructions:  Your physician recommends that you continue on your current medications as directed. Please refer to the Current Medication list given to you today.  Labwork: You will get lab work today:  BMP, CBC, INR, sed rate and troponin  Testing/Procedures: Your physician has recommended that you have a pacemaker generator change  Follow-Up:  SEE INSTRUCTION LETTER  Any Other Special Instructions Will Be Listed Below (If Applicable).  If you need a refill on your cardiac medications before your next appointment, please call your pharmacy.

## 2019-10-24 ENCOUNTER — Ambulatory Visit: Payer: Medicare PPO

## 2019-10-24 DIAGNOSIS — Z950 Presence of cardiac pacemaker: Secondary | ICD-10-CM | POA: Diagnosis not present

## 2019-10-24 DIAGNOSIS — I10 Essential (primary) hypertension: Secondary | ICD-10-CM | POA: Diagnosis not present

## 2019-10-24 DIAGNOSIS — I480 Paroxysmal atrial fibrillation: Secondary | ICD-10-CM | POA: Diagnosis not present

## 2019-10-24 DIAGNOSIS — R079 Chest pain, unspecified: Secondary | ICD-10-CM | POA: Diagnosis not present

## 2019-10-24 DIAGNOSIS — I495 Sick sinus syndrome: Secondary | ICD-10-CM | POA: Diagnosis not present

## 2019-10-29 ENCOUNTER — Other Ambulatory Visit (HOSPITAL_COMMUNITY)
Admission: RE | Admit: 2019-10-29 | Discharge: 2019-10-29 | Disposition: A | Discharge status: Home / Self Care | Payer: Medicare PPO | Source: Ambulatory Visit | Attending: Internal Medicine | Admitting: Internal Medicine

## 2019-10-29 ENCOUNTER — Other Ambulatory Visit: Payer: Self-pay

## 2019-10-29 DIAGNOSIS — Z20822 Contact with and (suspected) exposure to covid-19: Secondary | ICD-10-CM | POA: Diagnosis not present

## 2019-10-29 DIAGNOSIS — Z01812 Encounter for preprocedural laboratory examination: Secondary | ICD-10-CM | POA: Insufficient documentation

## 2019-10-29 LAB — SARS CORONAVIRUS 2 (TAT 6-24 HRS): SARS Coronavirus 2: NEGATIVE

## 2019-10-31 ENCOUNTER — Other Ambulatory Visit: Payer: Self-pay

## 2019-10-31 ENCOUNTER — Encounter (HOSPITAL_COMMUNITY)
Admission: RE | Disposition: A | Discharge status: Home / Self Care | Payer: Self-pay | Source: Home / Self Care | Attending: Internal Medicine

## 2019-10-31 ENCOUNTER — Ambulatory Visit (HOSPITAL_COMMUNITY)
Admission: RE | Admit: 2019-10-31 | Discharge: 2019-10-31 | Disposition: A | Discharge status: Home / Self Care | Payer: Medicare PPO | Attending: Internal Medicine | Admitting: Internal Medicine

## 2019-10-31 DIAGNOSIS — F329 Major depressive disorder, single episode, unspecified: Secondary | ICD-10-CM | POA: Diagnosis not present

## 2019-10-31 DIAGNOSIS — Z79899 Other long term (current) drug therapy: Secondary | ICD-10-CM | POA: Diagnosis not present

## 2019-10-31 DIAGNOSIS — I4891 Unspecified atrial fibrillation: Secondary | ICD-10-CM | POA: Diagnosis present

## 2019-10-31 DIAGNOSIS — Z8673 Personal history of transient ischemic attack (TIA), and cerebral infarction without residual deficits: Secondary | ICD-10-CM | POA: Diagnosis not present

## 2019-10-31 DIAGNOSIS — I10 Essential (primary) hypertension: Secondary | ICD-10-CM | POA: Insufficient documentation

## 2019-10-31 DIAGNOSIS — R079 Chest pain, unspecified: Secondary | ICD-10-CM | POA: Insufficient documentation

## 2019-10-31 DIAGNOSIS — Z885 Allergy status to narcotic agent status: Secondary | ICD-10-CM | POA: Diagnosis not present

## 2019-10-31 DIAGNOSIS — I4819 Other persistent atrial fibrillation: Secondary | ICD-10-CM | POA: Insufficient documentation

## 2019-10-31 DIAGNOSIS — Z45010 Encounter for checking and testing of cardiac pacemaker pulse generator [battery]: Secondary | ICD-10-CM | POA: Diagnosis not present

## 2019-10-31 DIAGNOSIS — F419 Anxiety disorder, unspecified: Secondary | ICD-10-CM | POA: Insufficient documentation

## 2019-10-31 DIAGNOSIS — Z882 Allergy status to sulfonamides status: Secondary | ICD-10-CM | POA: Insufficient documentation

## 2019-10-31 DIAGNOSIS — K219 Gastro-esophageal reflux disease without esophagitis: Secondary | ICD-10-CM | POA: Insufficient documentation

## 2019-10-31 DIAGNOSIS — Z87891 Personal history of nicotine dependence: Secondary | ICD-10-CM | POA: Diagnosis not present

## 2019-10-31 DIAGNOSIS — Z91041 Radiographic dye allergy status: Secondary | ICD-10-CM | POA: Diagnosis not present

## 2019-10-31 DIAGNOSIS — Z85820 Personal history of malignant melanoma of skin: Secondary | ICD-10-CM | POA: Diagnosis not present

## 2019-10-31 DIAGNOSIS — I495 Sick sinus syndrome: Secondary | ICD-10-CM | POA: Diagnosis not present

## 2019-10-31 DIAGNOSIS — Z7901 Long term (current) use of anticoagulants: Secondary | ICD-10-CM | POA: Insufficient documentation

## 2019-10-31 HISTORY — PX: PPM GENERATOR CHANGEOUT: EP1233

## 2019-10-31 LAB — PROTIME-INR
INR: 2.2 — ABNORMAL HIGH (ref 0.8–1.2)
Prothrombin Time: 24 seconds — ABNORMAL HIGH (ref 11.4–15.2)

## 2019-10-31 SURGERY — PPM GENERATOR CHANGEOUT
Anesthesia: LOCAL

## 2019-10-31 MED ORDER — SODIUM CHLORIDE 0.9 % IV SOLN
INTRAVENOUS | Status: AC
  Filled 2019-10-31: qty 2

## 2019-10-31 MED ORDER — CHLORHEXIDINE GLUCONATE 4 % EX LIQD: 4.0000 "application " | Freq: Once | CUTANEOUS | Status: DC

## 2019-10-31 MED ORDER — CEFAZOLIN SODIUM-DEXTROSE 2-4 GM/100ML-% IV SOLN
INTRAVENOUS | Status: AC
  Filled 2019-10-31: qty 100

## 2019-10-31 MED ORDER — MIDAZOLAM HCL 5 MG/5ML IJ SOLN
INTRAMUSCULAR | Status: AC
  Filled 2019-10-31: qty 5

## 2019-10-31 MED ORDER — ACETAMINOPHEN 325 MG PO TABS: 325.0000 mg | ORAL_TABLET | ORAL | Status: DC | PRN

## 2019-10-31 MED ORDER — SODIUM CHLORIDE 0.9 % IV SOLN: INTRAVENOUS | Status: DC

## 2019-10-31 MED ORDER — LIDOCAINE HCL 1 % IJ SOLN
INTRAMUSCULAR | Status: AC
  Filled 2019-10-31: qty 60

## 2019-10-31 MED ORDER — ONDANSETRON HCL 4 MG/2ML IJ SOLN: 4.0000 mg | Freq: Four times a day (QID) | INTRAMUSCULAR | Status: DC | PRN

## 2019-10-31 MED ORDER — LIDOCAINE HCL (PF) 1 % IJ SOLN
INTRAMUSCULAR | Status: DC | PRN
  Administered 2019-10-31: 50 mL

## 2019-10-31 MED ORDER — SODIUM CHLORIDE 0.9 % IV SOLN
80.0000 mg | INTRAVENOUS | Status: AC
  Administered 2019-10-31: 80 mg

## 2019-10-31 MED ORDER — FENTANYL CITRATE (PF) 100 MCG/2ML IJ SOLN
INTRAMUSCULAR | Status: AC
  Filled 2019-10-31: qty 2

## 2019-10-31 MED ORDER — CEFAZOLIN SODIUM-DEXTROSE 2-4 GM/100ML-% IV SOLN
2.0000 g | INTRAVENOUS | Status: AC
  Administered 2019-10-31: 2 g via INTRAVENOUS

## 2019-10-31 SURGICAL SUPPLY — 4 items
CABLE SURGICAL S-101-97-12 (CABLE) ×2 IMPLANT
PACEMAKER ASSURITY DR-RF (Pacemaker) ×1 IMPLANT
PAD PRO RADIOLUCENT 2001M-C (PAD) ×2 IMPLANT
TRAY PACEMAKER INSERTION (PACKS) ×2 IMPLANT

## 2019-10-31 NOTE — Interval H&P Note (Signed)
History and Physical Interval Note:  10/31/2019 10:56 AM  Joshua [name]  has presented today for surgery, with the diagnosis of eri.  The various methods of treatment have been discussed with the patient and family. After consideration of risks, benefits and other options for treatment, the patient has consented to  Procedure(s): PPM GENERATOR CHANGEOUT (N/A) as a surgical intervention.  The patient's history has been reviewed, patient examined, no change in status, stable for surgery.  I have reviewed the patient's chart and labs.  Questions were answered to the patient's satisfaction.     Cristopher Peru

## 2019-10-31 NOTE — Discharge Instructions (Signed)
Post procedure care instructions Keep incision clean and dry for 10 days. No driving for 2 days.  You can remove outer dressing tomorrow. Leave steri-strips (little pieces of tape) on until seen in the office for wound check appointment. Call the office ([phone]) for redness, drainage, swelling, or fever.    Pacemaker Battery Change, Care After This sheet gives you information about how to care for yourself after your procedure. Your health care provider may also give you more specific instructions. If you have problems or questions, contact your health care provider. What can I expect after the procedure? After your procedure, it is common to have:  Pain or soreness at the site where the pacemaker was inserted.  Swelling at the site where the pacemaker was inserted. Follow these instructions at home: Incision care   Keep the incision clean and dry. ? Do not take baths, swim, or use a hot tub until your health care provider approves. ? You may shower the day after your procedure, or as directed by your health care provider. ? Pat the area dry with a clean towel. Do not rub the area. This may cause bleeding.  Follow instructions from your health care provider about how to take care of your incision. Make sure you: ? Wash your hands with soap and water before you change your bandage (dressing). If soap and water are not available, use hand sanitizer. ? Change your dressing as told by your health care provider. ? Leave stitches (sutures), skin glue, or adhesive strips in place. These skin closures may need to stay in place for 2 weeks or longer. If adhesive strip edges start to loosen and curl up, you may trim the loose edges. Do not remove adhesive strips completely unless your health care provider tells you to do that.  Check your incision area every day for signs of infection. Check for: ? More redness, swelling, or pain. ? More fluid or blood. ? Warmth. ? Pus or a bad  smell. Activity  Do not lift anything that is heavier than 10 lb (4.5 kg) until your health care provider says it is okay to do so.  For the first 2 weeks, or as long as told by your health care provider: ? Avoid lifting your left arm higher than your shoulder. ? Be gentle when you move your arms over your head. It is okay to raise your arm to comb your hair. ? Avoid strenuous exercise.  Ask your health care provider when it is okay to: ? Resume your normal activities. ? Return to work or school. ? Resume sexual activity. Eating and drinking  Eat a heart-healthy diet. This should include plenty of fresh fruits and vegetables, whole grains, low-fat dairy products, and lean protein like chicken and fish.  Limit alcohol intake to no more than 1 drink a day for non-pregnant women and 2 drinks a day for men. One drink equals 12 oz of beer, 5 oz of wine, or 1 oz of hard liquor.  Check ingredients and nutrition facts on packaged foods and beverages. Avoid the following types of food: ? Food that is high in salt (sodium). ? Food that is high in saturated fat, like full-fat dairy or red meat. ? Food that is high in trans fat, like fried food. ? Food and drinks that are high in sugar. Lifestyle  Do not use any products that contain nicotine or tobacco, such as cigarettes and e-cigarettes. If you need help quitting, ask your health care provider.    Take steps to manage and control your weight.  Get regular exercise. Aim for 150 minutes of moderate-intensity exercise (such as walking or yoga) or 75 minutes of vigorous exercise (such as running or swimming) each week.  Manage other health problems, such as diabetes or high blood pressure. Ask your health care provider how you can manage these conditions. General instructions  Do not drive for 24 hours after your procedure if you were given a medicine to help you relax (sedative).  Take over-the-counter and prescription medicines only as told  by your health care provider.  Avoid putting pressure on the area where the pacemaker was placed.  If you need an MRI after your pacemaker has been placed, be sure to tell the health care provider who orders the MRI that you have a pacemaker.  Avoid close and prolonged exposure to electrical devices that have strong magnetic fields. These include: ? Cell phones. Avoid keeping them in a pocket near the pacemaker, and try using the ear opposite the pacemaker. ? MP3 players. ? Household appliances, like microwaves. ? Metal detectors. ? Electric generators. ? High-tension wires.  Keep all follow-up visits as directed by your health care provider. This is important. Contact a health care provider if:  You have pain at the incision site that is not relieved by over-the-counter or prescription medicines.  You have any of these around your incision site or coming from it: ? More redness, swelling, or pain. ? Fluid or blood. ? Warmth to the touch. ? Pus or a bad smell.  You have a fever.  You feel brief, occasional palpitations, light-headedness, or any symptoms that you think might be related to your heart. Get help right away if:  You experience chest pain that is different from the pain at the pacemaker site.  You develop a red streak that extends above or below the incision site.  You experience shortness of breath.  You have palpitations or an irregular heartbeat.  You have light-headedness that does not go away quickly.  You faint or have dizzy spells.  Your pulse suddenly drops or increases rapidly and does not return to normal.  You begin to gain weight and your legs and ankles swell. Summary  After your procedure, it is common to have pain, soreness, and some swelling where the pacemaker was inserted.  Make sure to keep your incision clean and dry. Follow instructions from your health care provider about how to take care of your incision.  Check your incision every  day for signs of infection, such as more pain or swelling, pus or a bad smell, warmth, or leaking fluid and blood.  Avoid strenuous exercise and lifting your left arm higher than your shoulder for 2 weeks, or as long as told by your health care provider. This information is not intended to replace advice given to you by your health care provider. Make sure you discuss any questions you have with your health care provider. Document Revised: 08/26/2017 Document Reviewed: 08/05/2016 Elsevier Patient Education  2020 [employer]vier Inc.  

## 2019-11-01 MED FILL — Lidocaine HCl Local Inj 1%: INTRAMUSCULAR | Qty: 60 | Status: AC

## 2019-11-02 ENCOUNTER — Ambulatory Visit: Payer: Medicare PPO

## 2019-11-04 ENCOUNTER — Ambulatory Visit: Payer: Medicare PPO

## 2019-11-12 DIAGNOSIS — I482 Chronic atrial fibrillation, unspecified: Secondary | ICD-10-CM | POA: Diagnosis not present

## 2019-11-12 DIAGNOSIS — I10 Essential (primary) hypertension: Secondary | ICD-10-CM | POA: Diagnosis not present

## 2019-11-12 DIAGNOSIS — Z6826 Body mass index (BMI) 26.0-26.9, adult: Secondary | ICD-10-CM | POA: Diagnosis not present

## 2019-11-12 DIAGNOSIS — Z0000 Encounter for general adult medical examination without abnormal findings: Secondary | ICD-10-CM | POA: Diagnosis not present

## 2019-11-12 DIAGNOSIS — N401 Enlarged prostate with lower urinary tract symptoms: Secondary | ICD-10-CM | POA: Diagnosis not present

## 2019-11-12 DIAGNOSIS — K2100 Gastro-esophageal reflux disease with esophagitis, without bleeding: Secondary | ICD-10-CM | POA: Diagnosis not present

## 2019-11-13 ENCOUNTER — Other Ambulatory Visit: Payer: Self-pay

## 2019-11-13 ENCOUNTER — Ambulatory Visit (INDEPENDENT_AMBULATORY_CARE_PROVIDER_SITE_OTHER): Payer: Medicare PPO | Admitting: *Deleted

## 2019-11-13 DIAGNOSIS — I495 Sick sinus syndrome: Secondary | ICD-10-CM | POA: Diagnosis not present

## 2019-11-13 DIAGNOSIS — R55 Syncope and collapse: Secondary | ICD-10-CM | POA: Diagnosis not present

## 2019-11-13 LAB — CUP PACEART INCLINIC DEVICE CHECK
Battery Remaining Longevity: 126 mo
Battery Voltage: 2.99 V
Brady Statistic RA Percent Paced: 20 %
Brady Statistic RV Percent Paced: 27 %
Date Time Interrogation Session: 20210216100000
Implantable Lead Implant Date: 20120109
Implantable Lead Implant Date: 20120109
Implantable Lead Location: 753859
Implantable Lead Location: 753860
Implantable Pulse Generator Implant Date: 20210203
Lead Channel Impedance Value: 375 Ohm
Lead Channel Impedance Value: 375 Ohm
Lead Channel Pacing Threshold Amplitude: 1.25 V
Lead Channel Pacing Threshold Pulse Width: 0.5 ms
Lead Channel Sensing Intrinsic Amplitude: 12 mV
Lead Channel Sensing Intrinsic Amplitude: 4.3 mV
Lead Channel Setting Pacing Amplitude: 2 V
Lead Channel Setting Pacing Amplitude: 2.5 V
Lead Channel Setting Pacing Pulse Width: 0.5 ms
Lead Channel Setting Sensing Sensitivity: 3 mV
Pulse Gen Model: 2272
Pulse Gen Serial Number: 9193711

## 2019-11-13 NOTE — Progress Notes (Signed)
Wound check appointment. Steri-strips removed. Wound without redness or edema. Incision edges approximated, wound well healed. Normal device function. Thresholds, sensing, and impedances consistent with implant measurements. Device programmed at chronic settings due to mature leads. Histogram distribution appropriate for patient and level of activity. AT/AF burden 80%, currently in AF, 44 AMS episodes with 25 EGMS that show AF with the longest episode lasting 1 day. + Coumadin No  high ventricular rates noted. next remote scheduled for 01/31/20 and 3 months there after. Patient educated about wound care, arm mobility. ROV with Dr Lovena Le 02/01/20.

## 2019-11-13 NOTE — Patient Instructions (Signed)
Call the office if you ave any drainage, redness, or swelling at incision site.

## 2019-11-18 ENCOUNTER — Ambulatory Visit: Payer: Medicare PPO

## 2019-11-19 ENCOUNTER — Other Ambulatory Visit: Payer: Self-pay

## 2019-11-19 ENCOUNTER — Ambulatory Visit: Payer: Medicare PPO | Admitting: *Deleted

## 2019-11-19 DIAGNOSIS — I4891 Unspecified atrial fibrillation: Secondary | ICD-10-CM | POA: Diagnosis not present

## 2019-11-19 DIAGNOSIS — G459 Transient cerebral ischemic attack, unspecified: Secondary | ICD-10-CM

## 2019-11-19 DIAGNOSIS — Z5181 Encounter for therapeutic drug level monitoring: Secondary | ICD-10-CM

## 2019-11-19 LAB — POCT INR: INR: 3.6 — AB (ref 2.0–3.0)

## 2019-11-19 NOTE — Patient Instructions (Signed)
Hold warfarin tonight then resume 1 1/2 tablets daily except 2 tablets on Tuesdays. Eat extra greens / salad today Recheck in 3 weeks

## 2019-11-26 LAB — CUP PACEART INCLINIC DEVICE CHECK
Date Time Interrogation Session: 20210126112155
Implantable Pulse Generator Implant Date: 20120109
Pulse Gen Model: 2210
Pulse Gen Serial Number: 7196227

## 2019-12-10 ENCOUNTER — Other Ambulatory Visit: Payer: Self-pay

## 2019-12-10 ENCOUNTER — Ambulatory Visit: Payer: Medicare PPO | Admitting: *Deleted

## 2019-12-10 ENCOUNTER — Encounter (INDEPENDENT_AMBULATORY_CARE_PROVIDER_SITE_OTHER): Payer: Self-pay | Admitting: *Deleted

## 2019-12-10 DIAGNOSIS — I4891 Unspecified atrial fibrillation: Secondary | ICD-10-CM

## 2019-12-10 DIAGNOSIS — G459 Transient cerebral ischemic attack, unspecified: Secondary | ICD-10-CM | POA: Diagnosis not present

## 2019-12-10 DIAGNOSIS — Z5181 Encounter for therapeutic drug level monitoring: Secondary | ICD-10-CM | POA: Diagnosis not present

## 2019-12-10 LAB — POCT INR: INR: 2.7 (ref 2.0–3.0)

## 2019-12-10 NOTE — Patient Instructions (Signed)
Continue warfarin 1 1/2 tablets daily except 2 tablets on Tuesdays. Eat extra greens / salad today Recheck in 4 weeks

## 2019-12-18 ENCOUNTER — Ambulatory Visit (INDEPENDENT_AMBULATORY_CARE_PROVIDER_SITE_OTHER): Payer: Medicare PPO | Admitting: *Deleted

## 2019-12-18 DIAGNOSIS — I495 Sick sinus syndrome: Secondary | ICD-10-CM

## 2019-12-19 LAB — CUP PACEART REMOTE DEVICE CHECK
Battery Remaining Longevity: 109 mo
Battery Remaining Percentage: 95.5 %
Battery Voltage: 3.04 V
Brady Statistic AP VP Percent: 13 %
Brady Statistic AP VS Percent: 84 %
Brady Statistic AS VP Percent: 1.2 %
Brady Statistic AS VS Percent: 2.1 %
Brady Statistic RA Percent Paced: 42 %
Brady Statistic RV Percent Paced: 25 %
Date Time Interrogation Session: 20210324114431
Implantable Lead Implant Date: 20120109
Implantable Lead Implant Date: 20120109
Implantable Lead Location: 753859
Implantable Lead Location: 753860
Implantable Pulse Generator Implant Date: 20210203
Lead Channel Impedance Value: 380 Ohm
Lead Channel Impedance Value: 380 Ohm
Lead Channel Pacing Threshold Amplitude: 1.25 V
Lead Channel Pacing Threshold Pulse Width: 0.5 ms
Lead Channel Sensing Intrinsic Amplitude: 12 mV
Lead Channel Sensing Intrinsic Amplitude: 4.4 mV
Lead Channel Setting Pacing Amplitude: 2 V
Lead Channel Setting Pacing Amplitude: 2.5 V
Lead Channel Setting Pacing Pulse Width: 0.5 ms
Lead Channel Setting Sensing Sensitivity: 3 mV
Pulse Gen Model: 2272
Pulse Gen Serial Number: 9193711

## 2020-01-08 ENCOUNTER — Other Ambulatory Visit: Payer: Self-pay

## 2020-01-08 ENCOUNTER — Ambulatory Visit (INDEPENDENT_AMBULATORY_CARE_PROVIDER_SITE_OTHER): Payer: Medicare PPO | Admitting: *Deleted

## 2020-01-08 DIAGNOSIS — I4891 Unspecified atrial fibrillation: Secondary | ICD-10-CM

## 2020-01-08 DIAGNOSIS — Z5181 Encounter for therapeutic drug level monitoring: Secondary | ICD-10-CM | POA: Diagnosis not present

## 2020-01-08 DIAGNOSIS — G459 Transient cerebral ischemic attack, unspecified: Secondary | ICD-10-CM | POA: Diagnosis not present

## 2020-01-08 LAB — POCT INR: INR: 3.5 — AB (ref 2.0–3.0)

## 2020-01-08 NOTE — Patient Instructions (Signed)
Hold warfarin tonight then resume 1 1/2 tablets daily except 2 tablets on Tuesdays. Eat extra greens / salad today Recheck in 4 weeks

## 2020-01-16 ENCOUNTER — Telehealth: Payer: Self-pay | Admitting: Internal Medicine

## 2020-01-16 DIAGNOSIS — I4891 Unspecified atrial fibrillation: Secondary | ICD-10-CM

## 2020-01-16 NOTE — Telephone Encounter (Signed)
Spoke with pt, requested manual transmission. While waiting for transmission to come through discussed with pt how he has been feeling.   He states over the last week he has felt like he was in AF again, not feeling wel;l.  [place] the beach and unable to enjoy his time as he felt he needed to be in bed.    He is wondering if Tikosyn is not working.  Advised would forward PM data to Dr. Tanna Furry nurse to be reviewed with him.

## 2020-01-16 NOTE — Telephone Encounter (Signed)
At time of transmission today pt not in AF.  Pt states he feels fine right now.  According to transmission AF burden is 54%   Pt states he feels like his Tikosyn is not working and would like MD to review whether he should continue taking medication.

## 2020-01-16 NOTE — Telephone Encounter (Signed)
Pt c/o medication issue:  1. Name of Medication: dofetilide (TIKOSYN) 500 MCG capsule  2. How are you currently taking this medication (dosage and times per day)? 1 capsule by mouth twice daily   3. Are you having a reaction (difficulty breathing--STAT)? No   4. What is your medication issue? Reiss is calling stating he does not feel this medication is working. He states it makes him feel terrible as if he has the flu and he is also having soreness in his chest around his pacemaker. Ronzell also states when it is not working he is still experiencing Afib. Please advise.

## 2020-01-18 NOTE — Telephone Encounter (Signed)
Per Dr. Lovena Le:  1.  Consider referral to discuss ablation  2.  Stop dofetilide and start amiodarone   Pt would like to discuss ablation.  Will refer to Dr. Rayann Heman to discuss.  Will order ECHO

## 2020-01-25 ENCOUNTER — Other Ambulatory Visit: Payer: Self-pay

## 2020-01-25 ENCOUNTER — Telehealth: Payer: Self-pay

## 2020-01-25 ENCOUNTER — Telehealth (INDEPENDENT_AMBULATORY_CARE_PROVIDER_SITE_OTHER): Payer: Medicare PPO | Admitting: Internal Medicine

## 2020-01-25 ENCOUNTER — Encounter: Payer: Self-pay | Admitting: Internal Medicine

## 2020-01-25 VITALS — Ht 70.0 in | Wt 167.0 lb

## 2020-01-25 DIAGNOSIS — I10 Essential (primary) hypertension: Secondary | ICD-10-CM | POA: Diagnosis not present

## 2020-01-25 DIAGNOSIS — G4733 Obstructive sleep apnea (adult) (pediatric): Secondary | ICD-10-CM

## 2020-01-25 DIAGNOSIS — I480 Paroxysmal atrial fibrillation: Secondary | ICD-10-CM | POA: Diagnosis not present

## 2020-01-25 DIAGNOSIS — I4891 Unspecified atrial fibrillation: Secondary | ICD-10-CM

## 2020-01-25 DIAGNOSIS — I495 Sick sinus syndrome: Secondary | ICD-10-CM | POA: Diagnosis not present

## 2020-01-25 MED ORDER — PREDNISONE 50 MG PO TABS: ORAL_TABLET | ORAL | 0 refills | Status: DC

## 2020-01-25 NOTE — Telephone Encounter (Signed)
Pt scheduled for afib ablation on March 06, 2020  Pt has [facility] coumadin visit on 02/05/20 - switch from coumadin to xarelto-message sent to coumadin clinic in [place].  Pt will get labs at [facility] on May 24.  Covid test scheduled.  Need to fax lab orders to [facility] and send instruction letters to Pt.  Order placed for prednisone taper prior to cardiac CT

## 2020-01-25 NOTE — Progress Notes (Signed)
Electrophysiology TeleHealth Note   Due to national recommendations of social distancing due to COVID 19, an audio/video telehealth visit is felt to be most appropriate for this patient at this time.  See MyChart message from today for the patient's consent to telehealth for [facility].  Date:  01/25/2020   ID:  LEGION HITCHNER, DOB [birth year], MRN [id number]  Location: patient's home  Provider location:  [place]  Evaluation Performed: Follow-up visit  PCP:  Neale Burly, MD   Electrophysiologist:  Dr Lovena Le  Chief Complaint:  palpitations  History of Present Illness:    JAKUB TAPP is a 79 y.o. male who presents via telehealth conferencing today.  He presents for Dr Lovena Le to discuss afib.  He reports initially being diagnosed with atrial fibrillation in 2011 after presenting with tachypalpitations and fatigue.  During "cool down" from a stress test, he was observed to have afib.  He wore an event monitor which revealed multiple pauses.   He underwent PPM (SJM) at that time by Dr Lovena Le.  He has had increasing frequency and duration of atrial fibrillation.  He was placed on tikosyn and did well originally.   He has recently developed worseninig afib (burden by recent PPM was 56%).  He reports symptoms of fatigue and reduced exercise tolerance.  He is unaware of triggers/ precipitants for his afib.  Today, he denies symptoms of chest pain, shortness of breath,  lower extremity edema, dizziness, presyncope, or syncope.  The patient is otherwise without complaint today.   Past Medical History:  Diagnosis Date  . Anxiety   . BPH (benign prostatic hyperplasia)   . Depression   . Erectile dysfunction   . ERYTHROCYTOSIS   . Essential hypertension, benign   . GERD (gastroesophageal reflux disease)   . Melanoma ([place]) 2003   L arm removed  . Neuropathy   . OSA (obstructive sleep apnea)    mild,  did not tolerate CPAP  . Paroxysmal atrial fibrillation ([facility])   . PONV  (postoperative nausea and vomiting)   . SICK SINUS SYNDROME   . TIA 2005   pt does not think he had a TIA  . Visual loss    intermittent    Past Surgical History:  Procedure Laterality Date  . CHOLECYSTECTOMY    . COLONOSCOPY  2013  . INSERT / REPLACE / REMOVE PACEMAKER     implanted by Dr Lovena Le for sick sinus  . LYMPH NODE BIOPSY Left   . PACEMAKER INSERTION    . PPM GENERATOR CHANGEOUT N/A 10/31/2019   Dr Lovena Le,  SJM  . skin cancer removal    . VASECTOMY      Current Outpatient Medications  Medication Sig Dispense Refill  . acetaminophen (TYLENOL) 325 MG tablet Take 325-650 mg by mouth every 6 (six) hours as needed (for pain.).    Marland Kitchen dofetilide (TIKOSYN) 500 MCG capsule TAKE ONE CAPSULE BY MOUTH TWICE DAILY - PT MUST SCHEDULE APPOINTMENT FOR EKG AFTER STARTING GENERIC 180 capsule 2  . gabapentin (NEURONTIN) 300 MG capsule Take 300-600 mg by mouth See admin instructions. Take 1 capsule (300 mg) by mouth in the morning, take 1 capsule (300 mg) by mouth at noon, & take 2 capsules (600 mg) by mouth at bedtime.    . metoprolol tartrate (LOPRESSOR) 25 MG tablet TAKE 1 TABLET BY MOUTH TWICE DAILY 60 tablet 11  . tadalafil (CIALIS) 5 MG tablet Take 5 mg by mouth daily.     Marland Kitchen  testosterone cypionate (DEPOTESTOTERONE CYPIONATE) 200 MG/ML injection Inject 200 mg into the muscle every 14 (fourteen) days.     Marland Kitchen warfarin (COUMADIN) 5 MG tablet TAKE AS DIRECTED BY COUMADIN CLINIC 50 tablet 3   No current facility-administered medications for this visit.    Allergies:   Sulfonamide derivatives, Codeine, and Contrast media [iodinated diagnostic agents]   Social History:  The patient  reports that he has quit smoking. He has never used smokeless tobacco. He reports that he does not drink alcohol or use drugs.   ROS:  Please see the history of present illness.   All other systems are personally reviewed and negative.    Exam:    Vital Signs:  Ht 5\' 10"  (1.778 m)   Wt 167 lb (75.8 kg)   BMI  23.96 kg/m   Well sounding and appearing, alert and conversant, regular work of breathing,  good skin color Eyes- anicteric, neuro- grossly intact, skin- no apparent rash or lesions or cyanosis, mouth- oral mucosa is pink  Labs/Other Tests and Data Reviewed:    Recent Labs: 10/23/2019: BUN 14; Creatinine, Ser 1.32; Hemoglobin 16.9; Platelets 243; Potassium 4.6; Sodium 139   Wt Readings from Last 3 Encounters:  01/25/20 167 lb (75.8 kg)  10/31/19 180 lb (81.6 kg)  10/23/19 188 lb 3.2 oz (85.4 kg)     Last device remote is reviewed from [facility] which reveals normal device function,  afib burden 56%   ASSESSMENT & PLAN:    1.  Paroxysmal atrial fibrillation The patient has symptomatic, recurrent paroxysmal atrial fibrillation.   Pacemaker interrogation shows organized atrial activity at times and may represent atrial flutter. he has failed medical therapy with tikosyn. Chads2vasc score is 3.  he is anticoagulated with coumadin . Therapeutic strategies for afib including medicine and ablation were discussed in detail with the patient today. Risk, benefits, and alternatives to EP study and radiofrequency ablation for afib were also discussed in detail today. These risks include but are not limited to stroke, bleeding, vascular damage, tamponade, perforation, damage to the esophagus, lungs, and other structures, pulmonary vein stenosis, worsening renal function, and death. The patient understands these risk and wishes to proceed.  We will therefore proceed with catheter ablation at the next available time.  Carto, ICE, anesthesia are requested for the procedure.  Will also obtain cardiac CT prior to the procedure to exclude LAA thrombus and further evaluate atrial anatomy. Will switch coumadin to xarelto for the procedure.  Will need contrast preparation for cardiac CT but not for ablation.  2. Mild OSA Did not tolerate CPAP  3. HTN Stable No change required today  4. Sick sinus   Remotes are up to date and reviewed   Patient Risk:  after full review of this patients clinical status, I feel that they are at moderate risk at this time.  Today, I have spent 25 minutes with the patient with telehealth technology discussing arrhythmia management .    Army Fossa, MD  01/25/2020 9:40 AM     [address] [place] [address] [phone] (office) [phone] (fax)

## 2020-01-25 NOTE — Telephone Encounter (Signed)
-----   Message from Thompson Grayer, MD sent at 01/25/2020  9:48 AM EDT ----- Afib ablation  C/I/A  Stop coumadin and start xarelto 20mg  daily prior to the procedure   Cardiac CT- will need contrast allergy prophylaxis

## 2020-01-28 NOTE — Telephone Encounter (Signed)
Instruction letters mailed.  Work up complete.  Pt has appt to switch to Xarelto at [facility] office.

## 2020-01-29 ENCOUNTER — Other Ambulatory Visit: Payer: Self-pay

## 2020-01-29 ENCOUNTER — Ambulatory Visit (INDEPENDENT_AMBULATORY_CARE_PROVIDER_SITE_OTHER): Payer: Medicare PPO | Admitting: *Deleted

## 2020-01-29 DIAGNOSIS — I4891 Unspecified atrial fibrillation: Secondary | ICD-10-CM

## 2020-01-29 DIAGNOSIS — Z5181 Encounter for therapeutic drug level monitoring: Secondary | ICD-10-CM

## 2020-01-29 DIAGNOSIS — G459 Transient cerebral ischemic attack, unspecified: Secondary | ICD-10-CM

## 2020-01-29 LAB — POCT INR: INR: 3.2 — AB (ref 2.0–3.0)

## 2020-01-29 MED ORDER — RIVAROXABAN 20 MG PO TABS: 20.0000 mg | ORAL_TABLET | Freq: Every day | ORAL | 6 refills | Status: DC

## 2020-01-29 NOTE — Patient Instructions (Signed)
Take warfarin 1/2 tablet tonight then stop.  Start Xarelto 20mg  daily tomorrow night with the evening meal. Ablation scheduled for 03/06/20 1 month Xarelto follow up 03/18/20

## 2020-02-01 ENCOUNTER — Encounter: Payer: Medicare PPO | Admitting: Internal Medicine

## 2020-02-13 DIAGNOSIS — Z08 Encounter for follow-up examination after completed treatment for malignant neoplasm: Secondary | ICD-10-CM | POA: Diagnosis not present

## 2020-02-13 DIAGNOSIS — Z1283 Encounter for screening for malignant neoplasm of skin: Secondary | ICD-10-CM | POA: Diagnosis not present

## 2020-02-13 DIAGNOSIS — Z7901 Long term (current) use of anticoagulants: Secondary | ICD-10-CM | POA: Diagnosis not present

## 2020-02-13 DIAGNOSIS — Z87891 Personal history of nicotine dependence: Secondary | ICD-10-CM | POA: Diagnosis not present

## 2020-02-13 DIAGNOSIS — I4891 Unspecified atrial fibrillation: Secondary | ICD-10-CM | POA: Diagnosis not present

## 2020-02-13 DIAGNOSIS — D225 Melanocytic nevi of trunk: Secondary | ICD-10-CM | POA: Diagnosis not present

## 2020-02-13 DIAGNOSIS — M545 Low back pain: Secondary | ICD-10-CM | POA: Diagnosis not present

## 2020-02-13 DIAGNOSIS — D485 Neoplasm of uncertain behavior of skin: Secondary | ICD-10-CM | POA: Diagnosis not present

## 2020-02-13 DIAGNOSIS — Z85820 Personal history of malignant melanoma of skin: Secondary | ICD-10-CM | POA: Diagnosis not present

## 2020-02-13 DIAGNOSIS — M62838 Other muscle spasm: Secondary | ICD-10-CM | POA: Diagnosis not present

## 2020-02-13 DIAGNOSIS — C44219 Basal cell carcinoma of skin of left ear and external auricular canal: Secondary | ICD-10-CM | POA: Diagnosis not present

## 2020-02-13 DIAGNOSIS — Z85828 Personal history of other malignant neoplasm of skin: Secondary | ICD-10-CM | POA: Diagnosis not present

## 2020-02-18 ENCOUNTER — Other Ambulatory Visit (HOSPITAL_COMMUNITY)
Admission: RE | Admit: 2020-02-18 | Discharge: 2020-02-18 | Disposition: A | Discharge status: Home / Self Care | Payer: Medicare PPO | Source: Ambulatory Visit | Attending: Internal Medicine | Admitting: Internal Medicine

## 2020-02-18 ENCOUNTER — Other Ambulatory Visit: Payer: Self-pay

## 2020-02-18 DIAGNOSIS — I4891 Unspecified atrial fibrillation: Secondary | ICD-10-CM | POA: Insufficient documentation

## 2020-02-18 LAB — CBC WITH DIFFERENTIAL/PLATELET
Abs Immature Granulocytes: 0.05 10*3/uL (ref 0.00–0.07)
Basophils Absolute: 0.1 10*3/uL (ref 0.0–0.1)
Basophils Relative: 1 %
Eosinophils Absolute: 0.2 10*3/uL (ref 0.0–0.5)
Eosinophils Relative: 2 %
HCT: 50.6 % (ref 39.0–52.0)
Hemoglobin: 16.7 g/dL (ref 13.0–17.0)
Immature Granulocytes: 0 %
Lymphocytes Relative: 19 %
Lymphs Abs: 2.2 10*3/uL (ref 0.7–4.0)
MCH: 31.4 pg (ref 26.0–34.0)
MCHC: 33 g/dL (ref 30.0–36.0)
MCV: 95.1 fL (ref 80.0–100.0)
Monocytes Absolute: 0.7 10*3/uL (ref 0.1–1.0)
Monocytes Relative: 6 %
Neutro Abs: 8.1 10*3/uL — ABNORMAL HIGH (ref 1.7–7.7)
Neutrophils Relative %: 72 %
Platelets: 225 10*3/uL (ref 150–400)
RBC: 5.32 MIL/uL (ref 4.22–5.81)
RDW: 16.3 % — ABNORMAL HIGH (ref 11.5–15.5)
WBC: 11.3 10*3/uL — ABNORMAL HIGH (ref 4.0–10.5)
nRBC: 0 % (ref 0.0–0.2)

## 2020-02-18 LAB — BASIC METABOLIC PANEL
Anion gap: 7 (ref 5–15)
BUN: 19 mg/dL (ref 8–23)
CO2: 32 mmol/L (ref 22–32)
Calcium: 9.2 mg/dL (ref 8.9–10.3)
Chloride: 98 mmol/L (ref 98–111)
Creatinine, Ser: 1.43 mg/dL — ABNORMAL HIGH (ref 0.61–1.24)
GFR calc Af Amer: 55 mL/min — ABNORMAL LOW (ref >60)
GFR calc non Af Amer: 47 mL/min — ABNORMAL LOW (ref >60)
Glucose, Bld: 120 mg/dL — ABNORMAL HIGH (ref 70–99)
Potassium: 4.7 mmol/L (ref 3.5–5.1)
Sodium: 137 mmol/L (ref 135–145)

## 2020-02-27 ENCOUNTER — Encounter (INDEPENDENT_AMBULATORY_CARE_PROVIDER_SITE_OTHER): Payer: Self-pay | Admitting: Internal Medicine

## 2020-02-28 ENCOUNTER — Telehealth (HOSPITAL_COMMUNITY): Payer: Self-pay | Admitting: *Deleted

## 2020-02-28 NOTE — Telephone Encounter (Signed)
Reaching out to patient to offer assistance regarding upcoming cardiac imaging study; pt verbalizes understanding of appt date/time, parking situation and where to check in, pre-test NPO status and medications ordered, and verified current allergies; name and call back number provided for further questions should they arise  [facility] [phone] office [phone] cell  Pt verbalized understanding of how to take 13 hour prep with prednisone and benadryl.

## 2020-03-03 ENCOUNTER — Ambulatory Visit (HOSPITAL_COMMUNITY)
Admission: RE | Admit: 2020-03-03 | Discharge: 2020-03-03 | Disposition: A | Discharge status: Home / Self Care | Payer: Medicare PPO | Source: Ambulatory Visit | Attending: Internal Medicine | Admitting: Internal Medicine

## 2020-03-03 ENCOUNTER — Other Ambulatory Visit: Payer: Self-pay

## 2020-03-03 DIAGNOSIS — I4891 Unspecified atrial fibrillation: Secondary | ICD-10-CM | POA: Insufficient documentation

## 2020-03-03 MED ORDER — IOHEXOL 350 MG/ML SOLN
80.0000 mL | Freq: Once | INTRAVENOUS | Status: AC | PRN
  Administered 2020-03-03: 100 mL via INTRAVENOUS

## 2020-03-04 ENCOUNTER — Other Ambulatory Visit (HOSPITAL_COMMUNITY)
Admission: RE | Admit: 2020-03-04 | Discharge: 2020-03-04 | Disposition: A | Discharge status: Home / Self Care | Payer: Medicare PPO | Source: Ambulatory Visit | Attending: Internal Medicine | Admitting: Internal Medicine

## 2020-03-04 DIAGNOSIS — Z20822 Contact with and (suspected) exposure to covid-19: Secondary | ICD-10-CM | POA: Insufficient documentation

## 2020-03-04 DIAGNOSIS — Z01812 Encounter for preprocedural laboratory examination: Secondary | ICD-10-CM | POA: Diagnosis not present

## 2020-03-04 LAB — SARS CORONAVIRUS 2 (TAT 6-24 HRS): SARS Coronavirus 2: NEGATIVE

## 2020-03-06 ENCOUNTER — Encounter (HOSPITAL_COMMUNITY)
Admission: RE | Disposition: A | Discharge status: Home / Self Care | Payer: Self-pay | Source: Home / Self Care | Attending: Internal Medicine

## 2020-03-06 ENCOUNTER — Other Ambulatory Visit: Payer: Self-pay

## 2020-03-06 ENCOUNTER — Ambulatory Visit (HOSPITAL_COMMUNITY)
Admission: RE | Admit: 2020-03-06 | Discharge: 2020-03-06 | Disposition: A | Discharge status: Home / Self Care | Payer: Medicare PPO | Attending: Internal Medicine | Admitting: Internal Medicine

## 2020-03-06 ENCOUNTER — Encounter (HOSPITAL_COMMUNITY): Payer: Self-pay | Admitting: Internal Medicine

## 2020-03-06 ENCOUNTER — Ambulatory Visit (HOSPITAL_COMMUNITY): Payer: Medicare PPO | Admitting: Certified Registered Nurse Anesthetist

## 2020-03-06 DIAGNOSIS — Z87891 Personal history of nicotine dependence: Secondary | ICD-10-CM | POA: Insufficient documentation

## 2020-03-06 DIAGNOSIS — F329 Major depressive disorder, single episode, unspecified: Secondary | ICD-10-CM | POA: Insufficient documentation

## 2020-03-06 DIAGNOSIS — Z91041 Radiographic dye allergy status: Secondary | ICD-10-CM | POA: Diagnosis not present

## 2020-03-06 DIAGNOSIS — F419 Anxiety disorder, unspecified: Secondary | ICD-10-CM | POA: Insufficient documentation

## 2020-03-06 DIAGNOSIS — Z8673 Personal history of transient ischemic attack (TIA), and cerebral infarction without residual deficits: Secondary | ICD-10-CM | POA: Diagnosis not present

## 2020-03-06 DIAGNOSIS — G629 Polyneuropathy, unspecified: Secondary | ICD-10-CM | POA: Insufficient documentation

## 2020-03-06 DIAGNOSIS — I10 Essential (primary) hypertension: Secondary | ICD-10-CM | POA: Insufficient documentation

## 2020-03-06 DIAGNOSIS — K219 Gastro-esophageal reflux disease without esophagitis: Secondary | ICD-10-CM | POA: Insufficient documentation

## 2020-03-06 DIAGNOSIS — Z79899 Other long term (current) drug therapy: Secondary | ICD-10-CM | POA: Insufficient documentation

## 2020-03-06 DIAGNOSIS — G459 Transient cerebral ischemic attack, unspecified: Secondary | ICD-10-CM | POA: Diagnosis not present

## 2020-03-06 DIAGNOSIS — N400 Enlarged prostate without lower urinary tract symptoms: Secondary | ICD-10-CM | POA: Insufficient documentation

## 2020-03-06 DIAGNOSIS — Z7901 Long term (current) use of anticoagulants: Secondary | ICD-10-CM | POA: Diagnosis not present

## 2020-03-06 DIAGNOSIS — Z885 Allergy status to narcotic agent status: Secondary | ICD-10-CM | POA: Diagnosis not present

## 2020-03-06 DIAGNOSIS — Z882 Allergy status to sulfonamides status: Secondary | ICD-10-CM | POA: Diagnosis not present

## 2020-03-06 DIAGNOSIS — I480 Paroxysmal atrial fibrillation: Secondary | ICD-10-CM | POA: Insufficient documentation

## 2020-03-06 DIAGNOSIS — I495 Sick sinus syndrome: Secondary | ICD-10-CM | POA: Diagnosis not present

## 2020-03-06 DIAGNOSIS — G4733 Obstructive sleep apnea (adult) (pediatric): Secondary | ICD-10-CM | POA: Diagnosis not present

## 2020-03-06 DIAGNOSIS — Z950 Presence of cardiac pacemaker: Secondary | ICD-10-CM | POA: Insufficient documentation

## 2020-03-06 HISTORY — PX: ATRIAL FIBRILLATION ABLATION: EP1191

## 2020-03-06 SURGERY — ATRIAL FIBRILLATION ABLATION
Anesthesia: General

## 2020-03-06 MED ORDER — ROCURONIUM BROMIDE 10 MG/ML (PF) SYRINGE
PREFILLED_SYRINGE | INTRAVENOUS | Status: DC | PRN
  Administered 2020-03-06: 50 mg via INTRAVENOUS
  Administered 2020-03-06: 30 mg via INTRAVENOUS

## 2020-03-06 MED ORDER — HEPARIN (PORCINE) IN NACL 2000-0.9 UNIT/L-% IV SOLN
INTRAVENOUS | Status: DC | PRN
  Administered 2020-03-06: 1000 mL

## 2020-03-06 MED ORDER — PROTAMINE SULFATE 10 MG/ML IV SOLN
INTRAVENOUS | Status: DC | PRN
  Administered 2020-03-06: 40 mg via INTRAVENOUS

## 2020-03-06 MED ORDER — HEPARIN SODIUM (PORCINE) 1000 UNIT/ML IJ SOLN
INTRAMUSCULAR | Status: AC
  Filled 2020-03-06: qty 2

## 2020-03-06 MED ORDER — PROPOFOL 500 MG/50ML IV EMUL
INTRAVENOUS | Status: DC | PRN
Start: 2020-03-06 — End: 2020-03-06
  Administered 2020-03-06 (×2): 125 ug/kg/min via INTRAVENOUS

## 2020-03-06 MED ORDER — ONDANSETRON HCL 4 MG/2ML IJ SOLN: 4.0000 mg | Freq: Four times a day (QID) | INTRAMUSCULAR | Status: DC | PRN

## 2020-03-06 MED ORDER — PANTOPRAZOLE SODIUM 40 MG PO TBEC
40.0000 mg | DELAYED_RELEASE_TABLET | Freq: Every day | ORAL | 0 refills | Status: DC
Start: 2020-03-06 — End: 2020-06-13

## 2020-03-06 MED ORDER — HEPARIN SODIUM (PORCINE) 1000 UNIT/ML IJ SOLN
INTRAMUSCULAR | Status: DC | PRN
Start: 2020-03-06 — End: 2020-03-06
  Administered 2020-03-06: 4000 [IU] via INTRAVENOUS
  Administered 2020-03-06: 5000 [IU] via INTRAVENOUS

## 2020-03-06 MED ORDER — ISOPROTERENOL HCL 0.2 MG/ML IJ SOLN
INTRAVENOUS | Status: DC | PRN
  Administered 2020-03-06: 20 ug/min via INTRAVENOUS

## 2020-03-06 MED ORDER — ISOPROTERENOL HCL 0.2 MG/ML IJ SOLN
INTRAMUSCULAR | Status: AC
  Filled 2020-03-06: qty 5

## 2020-03-06 MED ORDER — ACETAMINOPHEN 325 MG PO TABS: 650.0000 mg | ORAL_TABLET | ORAL | Status: DC | PRN

## 2020-03-06 MED ORDER — PROPOFOL 10 MG/ML IV BOLUS
INTRAVENOUS | Status: DC | PRN
  Administered 2020-03-06: 120 mg via INTRAVENOUS

## 2020-03-06 MED ORDER — MIDAZOLAM HCL 2 MG/2ML IJ SOLN
INTRAMUSCULAR | Status: DC | PRN
  Administered 2020-03-06: 2 mg via INTRAVENOUS

## 2020-03-06 MED ORDER — LIDOCAINE 2% (20 MG/ML) 5 ML SYRINGE
INTRAMUSCULAR | Status: DC | PRN
  Administered 2020-03-06: 60 mg via INTRAVENOUS

## 2020-03-06 MED ORDER — HEPARIN SODIUM (PORCINE) 1000 UNIT/ML IJ SOLN
INTRAMUSCULAR | Status: DC | PRN
  Administered 2020-03-06: 14000 [IU] via INTRAVENOUS

## 2020-03-06 MED ORDER — HEPARIN (PORCINE) IN NACL 1000-0.9 UT/500ML-% IV SOLN
INTRAVENOUS | Status: DC | PRN
  Administered 2020-03-06: 500 mL

## 2020-03-06 MED ORDER — ONDANSETRON HCL 4 MG/2ML IJ SOLN
INTRAMUSCULAR | Status: DC | PRN
  Administered 2020-03-06: 4 mg via INTRAVENOUS

## 2020-03-06 MED ORDER — PHENYLEPHRINE HCL-NACL 10-0.9 MG/250ML-% IV SOLN
INTRAVENOUS | Status: DC | PRN
Start: 2020-03-06 — End: 2020-03-06
  Administered 2020-03-06: 50 ug/min via INTRAVENOUS

## 2020-03-06 MED ORDER — SODIUM CHLORIDE 0.9 % IV SOLN: INTRAVENOUS | Status: DC

## 2020-03-06 MED ORDER — SUGAMMADEX SODIUM 200 MG/2ML IV SOLN
INTRAVENOUS | Status: DC | PRN
  Administered 2020-03-06: 200 mg via INTRAVENOUS

## 2020-03-06 MED ORDER — DEXAMETHASONE SODIUM PHOSPHATE 10 MG/ML IJ SOLN
INTRAMUSCULAR | Status: DC | PRN
  Administered 2020-03-06: 10 mg via INTRAVENOUS

## 2020-03-06 MED ORDER — SODIUM CHLORIDE 0.9 % IV SOLN: 250.0000 mL | INTRAVENOUS | Status: DC | PRN

## 2020-03-06 MED ORDER — SODIUM CHLORIDE 0.9% FLUSH: 3.0000 mL | INTRAVENOUS | Status: DC | PRN

## 2020-03-06 MED ORDER — FENTANYL CITRATE (PF) 250 MCG/5ML IJ SOLN
INTRAMUSCULAR | Status: DC | PRN
  Administered 2020-03-06 (×2): 50 ug via INTRAVENOUS

## 2020-03-06 MED ORDER — SODIUM CHLORIDE 0.9% FLUSH: 3.0000 mL | Freq: Two times a day (BID) | INTRAVENOUS | Status: DC

## 2020-03-06 MED ORDER — PHENYLEPHRINE 40 MCG/ML (10ML) SYRINGE FOR IV PUSH (FOR BLOOD PRESSURE SUPPORT)
PREFILLED_SYRINGE | INTRAVENOUS | Status: DC | PRN
  Administered 2020-03-06 (×2): 80 ug via INTRAVENOUS
  Administered 2020-03-06: 60 ug via INTRAVENOUS

## 2020-03-06 SURGICAL SUPPLY — 21 items
BLANKET WARM UNDERBOD FULL ACC (MISCELLANEOUS) ×3 IMPLANT
CATH 8FR REPROCESSED SOUNDSTAR (CATHETERS) ×3 IMPLANT
CATH 8FR SOUNDSTAR REPROCESSED (CATHETERS) IMPLANT
CATH MAPPNG PENTARAY F 2-6-2MM (CATHETERS) IMPLANT
CATH SMTCH THERMOCOOL SF DF (CATHETERS) ×4 IMPLANT
CATH WEBSTER BI DIR CS D-F CRV (CATHETERS) ×2 IMPLANT
COVER SWIFTLINK CONNECTOR (BAG) ×5 IMPLANT
DEVICE CLOSURE PERCLS PRGLD 6F (VASCULAR PRODUCTS) IMPLANT
NDL BAYLIS TRANSSEPTAL 71CM (NEEDLE) IMPLANT
NEEDLE BAYLIS TRANSSEPTAL 71CM (NEEDLE) ×3 IMPLANT
PACK EP LATEX FREE (CUSTOM PROCEDURE TRAY) ×3
PACK EP LF (CUSTOM PROCEDURE TRAY) ×1 IMPLANT
PAD PRO RADIOLUCENT 2001M-C (PAD) ×3 IMPLANT
PATCH CARTO3 (PAD) ×2 IMPLANT
PENTARAY F 2-6-2MM (CATHETERS) ×3
PERCLOSE PROGLIDE 6F (VASCULAR PRODUCTS) ×9
SHEATH PINNACLE 7F 10CM (SHEATH) ×4 IMPLANT
SHEATH PINNACLE 9F 10CM (SHEATH) ×2 IMPLANT
SHEATH PROBE COVER 6X72 (BAG) ×2 IMPLANT
SHEATH SWARTZ TS SL2 63CM 8.5F (SHEATH) ×2 IMPLANT
TUBING SMART ABLATE COOLFLOW (TUBING) ×2 IMPLANT

## 2020-03-06 NOTE — Anesthesia Postprocedure Evaluation (Signed)
Anesthesia Post Note  Patient: Joshua [name]  Procedure(s) Performed: ATRIAL FIBRILLATION ABLATION (N/A )     Patient location during evaluation: PACU Anesthesia Type: General Level of consciousness: awake and alert Pain management: pain level controlled Vital Signs Assessment: post-procedure vital signs reviewed and stable Respiratory status: spontaneous breathing, nonlabored ventilation, respiratory function stable and patient connected to nasal cannula oxygen Cardiovascular status: blood pressure returned to baseline and stable Postop Assessment: no apparent nausea or vomiting Anesthetic complications: no   No complications documented.  Last Vitals:  Vitals:   03/06/20 1130 03/06/20 1144  BP: 113/64 109/60  Pulse: 77 78  Resp: 18 16  Temp:    SpO2: 97% 96%    Last Pain:  Vitals:   03/06/20 1125  TempSrc: Temporal  PainSc: 0-No pain                 Effie Berkshire

## 2020-03-06 NOTE — Transfer of Care (Signed)
Immediate Anesthesia Transfer of Care Note  Patient: Joshua [name]  Procedure(s) Performed: ATRIAL FIBRILLATION ABLATION (N/A )  Patient Location: Cath Lab  Anesthesia Type:General  Level of Consciousness: drowsy  Airway & Oxygen Therapy: Patient Spontanous Breathing and Patient connected to nasal cannula oxygen  Post-op Assessment: Report given to RN and Post -op Vital signs reviewed and stable  Post vital signs: Reviewed and stable  Last Vitals:  Vitals Value Taken Time  BP 100/56 03/06/20 1039  Temp    Pulse 65 03/06/20 1042  Resp 12 03/06/20 1042  SpO2 98 % 03/06/20 1042  Vitals shown include unvalidated device data.  Last Pain:  Vitals:   03/06/20 0550  TempSrc:   PainSc: 0-No pain      Patients Stated Pain Goal: 3 (48/34/75 8307)  Complications: No complications documented.

## 2020-03-06 NOTE — Discharge Instructions (Signed)
Femoral Site Care This sheet gives you information about how to care for yourself after your procedure. Your health care provider may also give you more specific instructions. If you have problems or questions, contact your health care provider. What can I expect after the procedure? After the procedure, it is common to have:  Bruising that usually fades within 1-2 weeks.  Tenderness at the site. Follow these instructions at home: Wound care  Follow instructions from your health care provider about how to take care of your insertion site. Make sure you: ? Wash your hands with soap and water before you change your bandage (dressing). If soap and water are not available, use hand sanitizer. ? Change your dressing as told by your health care provider. ? Leave stitches (sutures), skin glue, or adhesive strips in place. These skin closures may need to stay in place for 2 weeks or longer. If adhesive strip edges start to loosen and curl up, you may trim the loose edges. Do not remove adhesive strips completely unless your health care provider tells you to do that.  Do not take baths, swim, or use a hot tub until your health care provider approves.  You may shower 24-48 hours after the procedure or as told by your health care provider. ? Gently wash the site with plain soap and water. ? Pat the area dry with a clean towel. ? Do not rub the site. This may cause bleeding.  Do not apply powder or lotion to the site. Keep the site clean and dry.  Check your femoral site every day for signs of infection. Check for: ? Redness, swelling, or pain. ? Fluid or blood. ? Warmth. ? Pus or a bad smell. Activity  For the first 2-3 days after your procedure, or as long as directed: ? Avoid climbing stairs as much as possible. ? Do not squat.  Do not lift anything that is heavier than 10 lb (4.5 kg), or the limit that you are told, until your health care provider says that it is safe.  Rest as  directed. ? Avoid sitting for a long time without moving. Get up to take short walks every 1-2 hours.  Do not drive for 24 hours if you were given a medicine to help you relax (sedative). General instructions  Take over-the-counter and prescription medicines only as told by your health care provider.  Keep all follow-up visits as told by your health care provider. This is important. Contact a health care provider if you have:  A fever or chills.  You have redness, swelling, or pain around your insertion site. Get help right away if:  The catheter insertion area swells very fast.  You pass out.  You suddenly start to sweat or your skin gets clammy.  The catheter insertion area is bleeding, and the bleeding does not stop when you hold steady pressure on the area.  The area near or just beyond the catheter insertion site becomes pale, cool, tingly, or numb. These symptoms may represent a serious problem that is an emergency. Do not wait to see if the symptoms will go away. Get medical help right away. Call your local emergency services (911 in the [place] Do not drive yourself to the hospital. Summary  After the procedure, it is common to have bruising that usually fades within 1-2 weeks.  Check your femoral site every day for signs of infection.  Do not lift anything that is heavier than 10 lb (4.5 kg), or the   limit that you are told, until your health care provider says that it is safe. This information is not intended to replace advice given to you by your health care provider. Make sure you discuss any questions you have with your health care provider. Document Revised: 09/26/2017 Document Reviewed: 09/26/2017 Elsevier Patient Education  2020 [employer] Inc.  

## 2020-03-06 NOTE — H&P (Signed)
Chief Complaint:  palpitations  History of Present Illness:    Joshua [name] is a 79 y.o. male who presents for afib ablation.  He reports initially being diagnosed with atrial fibrillation in 2011 after presenting with tachypalpitations and fatigue.  During "cool down" from a stress test, he was observed to have afib.  He wore an event monitor which revealed multiple pauses.   He underwent PPM (SJM) at that time by Dr Lovena Le.  He has had increasing frequency and duration of atrial fibrillation.  He was placed on tikosyn and did well originally.   He has recently developed worseninig afib (burden by recent PPM was 56%).  He reports symptoms of fatigue and reduced exercise tolerance.  He is unaware of triggers/ precipitants for his afib.  Today, he denies symptoms of chest pain, shortness of breath,  lower extremity edema, dizziness, presyncope, or syncope.  The patient is otherwise without complaint today.       Past Medical History:  Diagnosis Date  . Anxiety   . BPH (benign prostatic hyperplasia)   . Depression   . Erectile dysfunction   . ERYTHROCYTOSIS   . Essential hypertension, benign   . GERD (gastroesophageal reflux disease)   . Melanoma ([place]) 2003   L arm removed  . Neuropathy   . OSA (obstructive sleep apnea)    mild,  did not tolerate CPAP  . Paroxysmal atrial fibrillation (HCC)   . PONV (postoperative nausea and vomiting)   . SICK SINUS SYNDROME   . TIA 2005   pt does not think he had a TIA  . Visual loss    intermittent         Past Surgical History:  Procedure Laterality Date  . CHOLECYSTECTOMY    . COLONOSCOPY  2013  . INSERT / REPLACE / REMOVE PACEMAKER     implanted by Dr Lovena Le for sick sinus  . LYMPH NODE BIOPSY Left   . PACEMAKER INSERTION    . PPM GENERATOR CHANGEOUT N/A 10/31/2019   Dr Lovena Le,  SJM  . skin cancer removal    . VASECTOMY      Current Outpatient Medications  Medication Sig Dispense Refill  .  acetaminophen (TYLENOL) 325 MG tablet Take 325-650 mg by mouth every 6 (six) hours as needed (for pain.).    Marland Kitchen dofetilide (TIKOSYN) 500 MCG capsule TAKE ONE CAPSULE BY MOUTH TWICE DAILY - PT MUST SCHEDULE APPOINTMENT FOR EKG AFTER STARTING GENERIC 180 capsule 2  . gabapentin (NEURONTIN) 300 MG capsule Take 300-600 mg by mouth See admin instructions. Take 1 capsule (300 mg) by mouth in the morning, take 1 capsule (300 mg) by mouth at noon, & take 2 capsules (600 mg) by mouth at bedtime.    . metoprolol tartrate (LOPRESSOR) 25 MG tablet TAKE 1 TABLET BY MOUTH TWICE DAILY 60 tablet 11  . tadalafil (CIALIS) 5 MG tablet Take 5 mg by mouth daily.     Marland Kitchen testosterone cypionate (DEPOTESTOTERONE CYPIONATE) 200 MG/ML injection Inject 200 mg into the muscle every 14 (fourteen) days.     Marland Kitchen warfarin (COUMADIN) 5 MG tablet TAKE AS DIRECTED BY COUMADIN CLINIC 50 tablet 3   No current facility-administered medications for this visit.    Allergies:   Sulfonamide derivatives, Codeine, and Contrast media [iodinated diagnostic agents]   Social History:  The patient  reports that he has quit smoking. He has never used smokeless tobacco. He reports that he does not drink alcohol or use drugs.   ROS:  Please see the history of present illness.   All other systems are personally reviewed and negative.    Exam:    Vital Signs:  Ht 5\' 10"  (1.778 m)   Wt 167 lb (75.8 kg)   BMI 23.96 kg/m   Well sounding and appearing, alert and conversant, regular work of breathing,  good skin color Eyes- anicteric, neuro- grossly intact, skin- no apparent rash or lesions or cyanosis, mouth- oral mucosa is pink  Labs/Other Tests and Data Reviewed:    Recent Labs: 10/23/2019: BUN 14; Creatinine, Ser 1.32; Hemoglobin 16.9; Platelets 243; Potassium 4.6; Sodium 139      Wt Readings from Last 3 Encounters:  01/25/20 167 lb (75.8 kg)  10/31/19 180 lb (81.6 kg)  10/23/19 188 lb 3.2 oz (85.4 kg)     Last  device remote is reviewed from [facility] PDF which reveals normal device function,  afib burden 56%   ASSESSMENT & PLAN:    1.  Paroxysmal atrial fibrillation The patient has symptomatic, recurrent paroxysmal atrial fibrillation.   Pacemaker interrogation shows organized atrial activity at times and may represent atrial flutter. he has failed medical therapy with tikosyn. Chads2vasc score is 3.  he is anticoagulated with coumadin . Therapeutic strategies for afib including medicine and ablation were discussed in detail with the patient today. Risk, benefits, and alternatives to EP study and radiofrequency ablation for afib were also discussed in detail today. These risks include but are not limited to stroke, bleeding, vascular damage, tamponade, perforation, damage to the esophagus, lungs, and other structures, pulmonary vein stenosis, worsening renal function, and death. The patient understands these risk and wishes to proceed.  We will therefore proceed with catheter ablation at the next available time.  Carto, ICE, anesthesia are requested for the procedure.  Will also obtain cardiac CT prior to the procedure to exclude LAA thrombus and further evaluate atrial anatomy.   He reports compliance with xarelto without interruption  Cardiac CT reviewed with him at length today.  Thompson Grayer MD, [facility] 03/06/2020 7:26 AM

## 2020-03-06 NOTE — Anesthesia Preprocedure Evaluation (Addendum)
Anesthesia Evaluation  Patient identified by MRN, date of birth, ID band Patient awake    Reviewed: Allergy & Precautions, NPO status , Patient's Chart, lab work & pertinent test results  History of Anesthesia Complications (+) PONV and history of anesthetic complications  Airway Mallampati: II  TM Distance: >3 FB Neck ROM: Full    Dental  (+) Partial Lower, Partial Upper   Pulmonary sleep apnea , former smoker,    Pulmonary exam normal        Cardiovascular hypertension, Pt. on home beta blockers + dysrhythmias Atrial Fibrillation + pacemaker  Rhythm:Irregular Rate:Normal     Neuro/Psych PSYCHIATRIC DISORDERS Anxiety Depression    GI/Hepatic Neg liver ROS, GERD  ,  Endo/Other  negative endocrine ROS  Renal/GU negative Renal ROS     Musculoskeletal negative musculoskeletal ROS (+)   Abdominal Normal abdominal exam  (+)   Peds  Hematology negative hematology ROS (+)   Anesthesia Other Findings - Sick Sinus Syndrome  Reproductive/Obstetrics                            Anesthesia Physical Anesthesia Plan  ASA: III  Anesthesia Plan: General   Post-op Pain Management:    Induction: Intravenous  PONV Risk Score and Plan: 4 or greater and Ondansetron, Dexamethasone, Treatment may vary due to age or medical condition, Propofol infusion and TIVA  Airway Management Planned: Oral ETT  Additional Equipment: None  Intra-op Plan:   Post-operative Plan: Extubation in OR  Informed Consent: I have reviewed the patients History and Physical, chart, labs and discussed the procedure including the risks, benefits and alternatives for the proposed anesthesia with the patient or authorized representative who has indicated his/her understanding and acceptance.     Dental advisory given  Plan Discussed with: CRNA  Anesthesia Plan Comments:        Anesthesia Quick Evaluation

## 2020-03-06 NOTE — Anesthesia Procedure Notes (Signed)
Procedure Name: Intubation Date/Time: 03/06/2020 7:50 AM Performed by: Valda Favia, CRNA Pre-anesthesia Checklist: Patient identified, Emergency Drugs available, Suction available and Patient being monitored Patient Re-evaluated:Patient Re-evaluated prior to induction Oxygen Delivery Method: Circle System Utilized Preoxygenation: Pre-oxygenation with 100% oxygen Induction Type: IV induction Ventilation: Mask ventilation without difficulty and Oral airway inserted - appropriate to patient size Laryngoscope Size: Mac and 4 Grade View: Grade I Tube type: Oral Tube size: 7.5 mm Number of attempts: 1 Airway Equipment and Method: Stylet and Oral airway Placement Confirmation: ETT inserted through vocal cords under direct vision,  positive ETCO2 and breath sounds checked- equal and bilateral Secured at: 23 cm Tube secured with: Tape Dental Injury: Teeth and Oropharynx as per pre-operative assessment

## 2020-03-07 ENCOUNTER — Encounter (HOSPITAL_COMMUNITY): Payer: Self-pay | Admitting: Internal Medicine

## 2020-03-07 LAB — POCT ACTIVATED CLOTTING TIME
Activated Clotting Time: 246 seconds
Activated Clotting Time: 257 seconds

## 2020-03-10 DIAGNOSIS — I482 Chronic atrial fibrillation, unspecified: Secondary | ICD-10-CM | POA: Diagnosis not present

## 2020-03-10 DIAGNOSIS — Z6823 Body mass index (BMI) 23.0-23.9, adult: Secondary | ICD-10-CM | POA: Diagnosis not present

## 2020-03-10 DIAGNOSIS — I10 Essential (primary) hypertension: Secondary | ICD-10-CM | POA: Diagnosis not present

## 2020-03-10 DIAGNOSIS — Z0000 Encounter for general adult medical examination without abnormal findings: Secondary | ICD-10-CM | POA: Diagnosis not present

## 2020-03-10 DIAGNOSIS — N401 Enlarged prostate with lower urinary tract symptoms: Secondary | ICD-10-CM | POA: Diagnosis not present

## 2020-03-10 DIAGNOSIS — K2100 Gastro-esophageal reflux disease with esophagitis, without bleeding: Secondary | ICD-10-CM | POA: Diagnosis not present

## 2020-03-18 ENCOUNTER — Ambulatory Visit: Payer: Medicare PPO

## 2020-03-19 ENCOUNTER — Telehealth: Payer: Self-pay

## 2020-03-19 NOTE — Telephone Encounter (Signed)
Spoke with patient to remind of missed remote transmission. Patient is out of town. Will transmit when he returns home.

## 2020-03-23 LAB — CUP PACEART REMOTE DEVICE CHECK
Battery Remaining Longevity: 103 mo
Battery Remaining Percentage: 95.5 %
Battery Voltage: 3.02 V
Brady Statistic AP VP Percent: 9.7 %
Brady Statistic AP VS Percent: 85 %
Brady Statistic AS VP Percent: 1 %
Brady Statistic AS VS Percent: 4.3 %
Brady Statistic RA Percent Paced: 90 %
Brady Statistic RV Percent Paced: 10 %
Date Time Interrogation Session: 20210626130836
Implantable Lead Implant Date: 20120109
Implantable Lead Implant Date: 20120109
Implantable Lead Location: 753859
Implantable Lead Location: 753860
Implantable Pulse Generator Implant Date: 20210203
Lead Channel Impedance Value: 380 Ohm
Lead Channel Impedance Value: 390 Ohm
Lead Channel Pacing Threshold Amplitude: 0.5 V
Lead Channel Pacing Threshold Amplitude: 1.25 V
Lead Channel Pacing Threshold Pulse Width: 0.4 ms
Lead Channel Pacing Threshold Pulse Width: 0.5 ms
Lead Channel Sensing Intrinsic Amplitude: 12 mV
Lead Channel Sensing Intrinsic Amplitude: 3.2 mV
Lead Channel Setting Pacing Amplitude: 2 V
Lead Channel Setting Pacing Amplitude: 2.5 V
Lead Channel Setting Pacing Pulse Width: 0.5 ms
Lead Channel Setting Sensing Sensitivity: 3 mV
Pulse Gen Model: 2272
Pulse Gen Serial Number: 9193711

## 2020-03-24 NOTE — Progress Notes (Deleted)
Cardiology Office Note  Date: 03/24/2020   ID: Joshua [name], DOB 1941/08/28, MRN [id number]  PCP:  Neale Burly, MD  Cardiologist:  No primary care provider on file. Electrophysiologist:  None   Chief Complaint: Atrial fibrillation s/p ablation, PPM with recent generator changeout 10/31/2019  History of Present Illness: Joshua [name] is a 79 y.o. male with a history of  Atrial fibrillation s/p ablation, PPM with recent generator changeout 10/31/2019. Other hx includes; Mild OSA, HTN, SSS,   Previous PPM 2011 after tachy-palpitations and fatigue during cool down after stress test. Noted to be in atrial fibrillation. PPM inserted by Dr Lovena Le. Initially placed on Tikosyn and did well originally. Developed worsening a fib burden on pacemaker check at 56%. Reported increased fatigue and exercise intolerance, Past Medical History:  Diagnosis Date  . Anxiety   . BPH (benign prostatic hyperplasia)   . Depression   . Erectile dysfunction   . ERYTHROCYTOSIS   . Essential hypertension, benign   . GERD (gastroesophageal reflux disease)   . Melanoma ([place]) 2003   L arm removed  . Neuropathy   . OSA (obstructive sleep apnea)    mild,  did not tolerate CPAP  . Paroxysmal atrial fibrillation (HCC)   . PONV (postoperative nausea and vomiting)   . SICK SINUS SYNDROME   . TIA 2005   pt does not think he had a TIA  . Visual loss    intermittent    Past Surgical History:  Procedure Laterality Date  . ATRIAL FIBRILLATION ABLATION N/A 03/06/2020   Procedure: ATRIAL FIBRILLATION ABLATION;  Surgeon: Thompson Grayer, MD;  Location: [place] [facility];  Service: Cardiovascular;  Laterality: N/A;  . CHOLECYSTECTOMY    . COLONOSCOPY  2013  . INSERT / REPLACE / REMOVE PACEMAKER     implanted by Dr Lovena Le for sick sinus  . LYMPH NODE BIOPSY Left   . PACEMAKER INSERTION    . PPM GENERATOR CHANGEOUT N/A 10/31/2019   Dr Lovena Le,  SJM  . skin cancer removal    . VASECTOMY      Current Outpatient  Medications  Medication Sig Dispense Refill  . acetaminophen (TYLENOL) 325 MG tablet Take 325-650 mg by mouth every 6 (six) hours as needed (for pain.).    Marland Kitchen dofetilide (TIKOSYN) 500 MCG capsule TAKE ONE CAPSULE BY MOUTH TWICE DAILY - PT MUST SCHEDULE APPOINTMENT FOR EKG AFTER STARTING GENERIC (Patient taking differently: Take 500 mcg by mouth 2 (two) times daily. ) 180 capsule 2  . gabapentin (NEURONTIN) 300 MG capsule Take 300-600 mg by mouth See admin instructions. Take 1 capsule (300 mg) by mouth in the morning, take 1 capsule (300 mg) by mouth at noon, & take 2 capsules (600 mg) by mouth at bedtime.    . metoprolol tartrate (LOPRESSOR) 25 MG tablet TAKE 1 TABLET BY MOUTH TWICE DAILY (Patient taking differently: Take 25 mg by mouth 2 (two) times daily. ) 60 tablet 11  . pantoprazole (PROTONIX) 40 MG tablet Take 1 tablet (40 mg total) by mouth daily. 45 tablet 0  . rivaroxaban (XARELTO) 20 MG TABS tablet Take 1 tablet (20 mg total) by mouth daily with supper. 30 tablet 6  . tadalafil (CIALIS) 5 MG tablet Take 5 mg by mouth daily.     Marland Kitchen testosterone cypionate (DEPOTESTOTERONE CYPIONATE) 200 MG/ML injection Inject 200 mg into the muscle every 14 (fourteen) days.      No current facility-administered medications for this visit.   Allergies:  Sulfonamide derivatives, Codeine, and Contrast media [iodinated diagnostic agents]   Social History: The patient  reports that he has quit smoking. He has never used smokeless tobacco. He reports that he does not drink alcohol and does not use drugs.   Family History: The patient's family history includes Prostate cancer in his father.   ROS:  Please see the history of present illness. Otherwise, complete review of systems is positive for {NONE DEFAULTED:18576::"none"}.  All other systems are reviewed and negative.   Physical Exam: VS:  There were no vitals taken for this visit., BMI There is no height or weight on file to calculate BMI.  Wt Readings from  Last 3 Encounters:  03/06/20 167 lb (75.8 kg)  01/25/20 167 lb (75.8 kg)  10/31/19 180 lb (81.6 kg)    General: Patient appears comfortable at rest. HEENT: Conjunctiva and lids normal, oropharynx clear with moist mucosa. Neck: Supple, no elevated JVP or carotid bruits, no thyromegaly. Lungs: Clear to auscultation, nonlabored breathing at rest. Cardiac: Regular rate and rhythm, no S3 or significant systolic murmur, no pericardial rub. Abdomen: Soft, nontender, no hepatomegaly, bowel sounds present, no guarding or rebound. Extremities: No pitting edema, distal pulses 2+. Skin: Warm and dry. Musculoskeletal: No kyphosis. Neuropsychiatric: Alert and oriented x3, affect grossly appropriate.  ECG:    Recent Labwork: 02/18/2020: BUN 19; Creatinine, Ser 1.43; Hemoglobin 16.7; Platelets 225; Potassium 4.7; Sodium 137  No results found for: CHOL, TRIG, HDL, CHOLHDL, VLDL, LDLCALC, LDLDIRECT  Other Studies Reviewed Today:  CT / CTA chest  IMPRESSION:  1. No acute process in the imaged extracardiac chest. 2. Mild to moderate centrilobular emphysema. 3. Suboptimal evaluation of peripheral predominant interstitial thickening, as above. This could be due to interstitial lung disease, possibly nonspecific interstitial pneumonitis. Consider dedicated high-resolution chest CT. 4. Aortic Atherosclerosis (ICD10-I70.0) and Emphysema (ICD10-J43.9).  FINDINGS: Moderate RAE. Mild LAE. No ASD/PFO. Pacing wires in RA/RV. No LAA thrombus. Mild aortic root dilatation 3.9 cm Normal PV anatomy PV;s appear small  RLPV poorly visualized and appears to be a small right middle PV  LUPV:  Ostium 13.3 mm    area 1.42 cm2  LLPV:   Ostium 14.4 mm   area 1.1 cm2  RUPV:  Ostium 15.7 mm   area 1.4 cm2  RLPV:  Ostium 10.4 mm   area 0.8 cm 2  Coronaries not evaluated as no beta blocker/nitro given Patient in afib during study and artifact over RCA due to pacing wires  Calcium Score: 227 involving  the LAD and diagonal branches  IMPRESSION: Sub-optimal study as patient in afib during gating  1.  Moderate RAE and mild LAE  2.  No LAA thrombus  3. Normal PV anatomy PV;s are small especially the RLPV and include a small right middle PV  4.  Mild aortic root dilatation 3.9 cm  5.  No PFO/ ASD  6.  No pericardial effusion  7. Calcium score 227 isolated to LAD this is 52 th percentile for age and sex  50.  Pacing wires in RA/RV  RF Ablation of Atrial fibrillation 03/06/2020 CONCLUSIONS:  1. Sinus rhythm upon presentation.   2. Intracardiac echo reveals a moderate sized left atrium with four  separate pulmonary veins without evidence of pulmonary vein stenosis.  3. Successful electrical isolation and anatomical encircling of all four  pulmonary veins with radiofrequency current.  4. No inducible arrhythmias following ablation both on and off of Isuprel  5. No early apparent complications.  Assessment and Plan:  1. S/P ablation of atrial fibrillation   2. Paroxysmal atrial fibrillation (HCC)   3. Sinus node dysfunction (HCC)   4. Pacemaker-St.Jude   5. Essential hypertension, benign    1. S/P ablation of atrial fibrillation Recent successful RF ablation of atrial fibrillation by Dr Rayann Heman on 03/06/2020  2. Paroxysmal atrial fibrillation (HCC) Recent increased burden of atrial fibrillation on Tikosyn  3. Sinus node dysfunction ([place]) Inserted 2011 St Jude for SSS. Pacemaker generator changeout 10/31/2019  4. Pacemaker-St.Jude Scheduled remote reviewed. Normal device function. Longest most recent AMS 2 hours. On [place]. 8% AF burden since 03/06/20. Post ablation 03/06/20.     5. Essential hypertension, benign ***  Medication Adjustments/Labs and Tests Ordered: Current medicines are reviewed at length with the patient today.  Concerns regarding medicines are outlined above.   Disposition: Follow-up with ***  Signed, Levell July, NP 03/24/2020 7:46 PM     [facility] [address], [place], [place] [address] Phone: [phone]; Fax: [phone]

## 2020-03-25 ENCOUNTER — Other Ambulatory Visit: Payer: Self-pay | Admitting: Internal Medicine

## 2020-03-25 ENCOUNTER — Encounter: Payer: Medicare PPO | Admitting: *Deleted

## 2020-03-25 DIAGNOSIS — Z9889 Other specified postprocedural states: Secondary | ICD-10-CM

## 2020-03-25 DIAGNOSIS — Z8679 Personal history of other diseases of the circulatory system: Secondary | ICD-10-CM | POA: Diagnosis not present

## 2020-03-25 DIAGNOSIS — I4891 Unspecified atrial fibrillation: Secondary | ICD-10-CM | POA: Diagnosis not present

## 2020-03-25 DIAGNOSIS — I495 Sick sinus syndrome: Secondary | ICD-10-CM

## 2020-03-25 DIAGNOSIS — I10 Essential (primary) hypertension: Secondary | ICD-10-CM

## 2020-03-25 DIAGNOSIS — I480 Paroxysmal atrial fibrillation: Secondary | ICD-10-CM

## 2020-03-25 DIAGNOSIS — Z950 Presence of cardiac pacemaker: Secondary | ICD-10-CM

## 2020-03-25 DIAGNOSIS — G459 Transient cerebral ischemic attack, unspecified: Secondary | ICD-10-CM

## 2020-03-25 DIAGNOSIS — Z5181 Encounter for therapeutic drug level monitoring: Secondary | ICD-10-CM

## 2020-03-25 NOTE — Progress Notes (Deleted)
Patient ID: Joshua [name], male   DOB: [birth year], 79 y.o.   MRN: [id number]   1 month Xarelto follow up  Pt was started on Xarelto 20mg  daily for atrial fib on 01/29/20.    Pt denies any problems since staring Xarelto.  He has not had any bleeding, excessive bruising or GI upset.  Labs from 02/18/20:  SCr 1.43  CrCl   Hgb 16.7  Hct 50.6  Plts 225  Reviewed patients medication list.  Pt is not currently on any combined P-gp and strong CYP3A4 inhibitors/inducers (ketoconazole, traconazole, ritonavir, carbamazepine, phenytoin, rifampin, St. John's wort).  Reviewed labs from 03/25/20 @ [facility]..  SCr 1.25, Weight 85kg, CrCl 60.44.  Dose is appropriate based on CrCl.   Hgb and HCT:  16.2/48.5  Plts 215  A full discussion of the nature of anticoagulants has been carried out.  A benefit/risk analysis has been presented to the patient, so that they understand the justification for choosing anticoagulation with Xarelto at this time.  The need for compliance is stressed.  Pt is aware to take the medication once daily with the largest meal of the day.  Side effects of potential bleeding are discussed, including unusual colored urine or stools, coughing up blood or coffee ground emesis, nose bleeds or serious fall or head trauma.  Discussed signs and symptoms of stroke. The patient should avoid any OTC items containing aspirin or ibuprofen.  Avoid alcohol consumption.   Call if any signs of abnormal bleeding.  Discussed financial obligations and resolved any difficulty in obtaining medication.    Call and LM for pt with lab results.  Labs are stable.  F/U in 6 months.  Placed in recall.

## 2020-03-30 ENCOUNTER — Other Ambulatory Visit: Payer: Self-pay | Admitting: Internal Medicine

## 2020-04-01 NOTE — Telephone Encounter (Signed)
This is a [place] pt °

## 2020-04-03 ENCOUNTER — Other Ambulatory Visit: Payer: Self-pay

## 2020-04-03 ENCOUNTER — Ambulatory Visit (HOSPITAL_COMMUNITY)
Admission: RE | Admit: 2020-04-03 | Discharge: 2020-04-03 | Disposition: A | Discharge status: Home / Self Care | Payer: Medicare PPO | Source: Ambulatory Visit | Attending: Physician Assistant | Admitting: Physician Assistant

## 2020-04-03 ENCOUNTER — Encounter (HOSPITAL_COMMUNITY): Payer: Self-pay | Admitting: Physician Assistant

## 2020-04-03 VITALS — BP 130/80 | HR 62 | Ht 70.0 in | Wt 175.4 lb

## 2020-04-03 DIAGNOSIS — Z79899 Other long term (current) drug therapy: Secondary | ICD-10-CM | POA: Insufficient documentation

## 2020-04-03 DIAGNOSIS — F329 Major depressive disorder, single episode, unspecified: Secondary | ICD-10-CM | POA: Diagnosis not present

## 2020-04-03 DIAGNOSIS — G629 Polyneuropathy, unspecified: Secondary | ICD-10-CM | POA: Insufficient documentation

## 2020-04-03 DIAGNOSIS — N529 Male erectile dysfunction, unspecified: Secondary | ICD-10-CM | POA: Diagnosis not present

## 2020-04-03 DIAGNOSIS — Z950 Presence of cardiac pacemaker: Secondary | ICD-10-CM | POA: Insufficient documentation

## 2020-04-03 DIAGNOSIS — Z9119 Patient's noncompliance with other medical treatment and regimen: Secondary | ICD-10-CM | POA: Diagnosis not present

## 2020-04-03 DIAGNOSIS — Z7901 Long term (current) use of anticoagulants: Secondary | ICD-10-CM | POA: Diagnosis not present

## 2020-04-03 DIAGNOSIS — I480 Paroxysmal atrial fibrillation: Secondary | ICD-10-CM

## 2020-04-03 DIAGNOSIS — Z885 Allergy status to narcotic agent status: Secondary | ICD-10-CM | POA: Diagnosis not present

## 2020-04-03 DIAGNOSIS — G4733 Obstructive sleep apnea (adult) (pediatric): Secondary | ICD-10-CM | POA: Insufficient documentation

## 2020-04-03 DIAGNOSIS — F419 Anxiety disorder, unspecified: Secondary | ICD-10-CM | POA: Diagnosis not present

## 2020-04-03 DIAGNOSIS — I495 Sick sinus syndrome: Secondary | ICD-10-CM | POA: Insufficient documentation

## 2020-04-03 DIAGNOSIS — K219 Gastro-esophageal reflux disease without esophagitis: Secondary | ICD-10-CM | POA: Diagnosis not present

## 2020-04-03 DIAGNOSIS — D6869 Other thrombophilia: Secondary | ICD-10-CM | POA: Diagnosis not present

## 2020-04-03 DIAGNOSIS — Z85820 Personal history of malignant melanoma of skin: Secondary | ICD-10-CM | POA: Insufficient documentation

## 2020-04-03 DIAGNOSIS — Z882 Allergy status to sulfonamides status: Secondary | ICD-10-CM | POA: Insufficient documentation

## 2020-04-03 DIAGNOSIS — I10 Essential (primary) hypertension: Secondary | ICD-10-CM | POA: Diagnosis not present

## 2020-04-03 DIAGNOSIS — Z87891 Personal history of nicotine dependence: Secondary | ICD-10-CM | POA: Diagnosis not present

## 2020-04-03 DIAGNOSIS — N400 Enlarged prostate without lower urinary tract symptoms: Secondary | ICD-10-CM | POA: Insufficient documentation

## 2020-04-03 LAB — MAGNESIUM: Magnesium: 2.2 mg/dL (ref 1.7–2.4)

## 2020-04-03 NOTE — Progress Notes (Signed)
Primary Care Physician: Neale Burly, MD Primary Electrophysiologist: Dr Lovena Le Referring Physician: Dr Earley Favor is a 79 y.o. male with a history of HTN, OSA, and paroxysmal atrial fibrillation who presents for follow up in the [facility]. He reports initially being diagnosed with atrial fibrillation in 2011 after presenting with tachypalpitations and fatigue.  During "cool down" from a stress test, he was observed to have afib.  He wore an event monitor which revealed multiple pauses.  He underwent PPM (SJM) at that time by Dr Lovena Le.  He was placed on tikosyn and did well originally but developed worseninig afib (burden by recent PPM was 56%). Patient is on Xarelto for a CHADS2VASC score of 3. He underwent afib ablation with Dr Rayann Heman on 03/06/20. He reports that he has had some afib since the ablation but he has been much less symptomatic. His afib burden on his device was down to 8%. He did have CP for about one week post ablation but this has resolved. He denies swallowing or groin issues.   Today, he denies symptoms of palpitations, chest pain, shortness of breath, orthopnea, PND, lower extremity edema, dizziness, presyncope, syncope, snoring, daytime somnolence, bleeding, or neurologic sequela. The patient is tolerating medications without difficulties and is otherwise without complaint today.    Atrial Fibrillation Risk Factors:  he does have symptoms or diagnosis of sleep apnea. he is not compliant with CPAP therapy. he does not have a history of rheumatic fever.   he has a BMI of Body mass index is 25.17 kg/m.Marland Kitchen Filed Weights   04/03/20 1436  Weight: 79.6 kg    Family History  Problem Relation Age of Onset   Prostate cancer Father      Atrial Fibrillation Management history:  Previous antiarrhythmic drugs: dofetilide  Previous cardioversions: none Previous ablations: 03/06/20 CHADS2VASC score: 3 Anticoagulation history:  warfarin, Xarelto   Past Medical History:  Diagnosis Date   Anxiety    BPH (benign prostatic hyperplasia)    Depression    Erectile dysfunction    ERYTHROCYTOSIS    Essential hypertension, benign    GERD (gastroesophageal reflux disease)    Melanoma ([place]) 2003   L arm removed   Neuropathy    OSA (obstructive sleep apnea)    mild,  did not tolerate CPAP   Paroxysmal atrial fibrillation (HCC)    PONV (postoperative nausea and vomiting)    SICK SINUS SYNDROME    TIA 2005   pt does not think he had a TIA   Visual loss    intermittent   Past Surgical History:  Procedure Laterality Date   ATRIAL FIBRILLATION ABLATION N/A 03/06/2020   Procedure: ATRIAL FIBRILLATION ABLATION;  Surgeon: Thompson Grayer, MD;  Location: [facility];  Service: Cardiovascular;  Laterality: N/A;   CHOLECYSTECTOMY     COLONOSCOPY  2013   INSERT / REPLACE / REMOVE PACEMAKER     implanted by Dr Lovena Le for sick sinus   LYMPH NODE BIOPSY Left    PACEMAKER INSERTION     PPM GENERATOR CHANGEOUT N/A 10/31/2019   Dr Lovena Le,  SJM   skin cancer removal     VASECTOMY      Current Outpatient Medications  Medication Sig Dispense Refill   acetaminophen (TYLENOL) 325 MG tablet Take 325-650 mg by mouth every 6 (six) hours as needed (for pain.).     calcium carbonate (OS-CAL - DOSED IN MG OF ELEMENTAL CALCIUM) 1250 (500 Ca)  MG tablet Take 1 tablet by mouth.     dofetilide (TIKOSYN) 500 MCG capsule TAKE ONE CAPSULE BY MOUTH TWICE DAILY 180 capsule 3   gabapentin (NEURONTIN) 300 MG capsule Take 300-600 mg by mouth See admin instructions. Take 1 capsule (300 mg) by mouth in the morning, take 1 capsule (300 mg) by mouth at noon, & take 2 capsules (600 mg) by mouth at bedtime.     metoprolol tartrate (LOPRESSOR) 25 MG tablet TAKE 1 TABLET BY MOUTH TWICE DAILY 60 tablet 9   Misc Natural Products (PRO HERBS MEMORY PO) Take by mouth.     Multiple Vitamins-Minerals (MULTIVITAMIN ADULT EXTRA C  PO) Take by mouth.     pantoprazole (PROTONIX) 40 MG tablet Take 1 tablet (40 mg total) by mouth daily. 45 tablet 0   POTASSIUM GLUCONATE PO Take by mouth.     rivaroxaban (XARELTO) 20 MG TABS tablet Take 1 tablet (20 mg total) by mouth daily with supper. 30 tablet 6   tadalafil (CIALIS) 5 MG tablet Take 5 mg by mouth daily.      testosterone cypionate (DEPOTESTOTERONE CYPIONATE) 200 MG/ML injection Inject 200 mg into the muscle every 14 (fourteen) days.      No current facility-administered medications for this encounter.    Allergies  Allergen Reactions   Sulfonamide Derivatives Other (See Comments)    Breathing trouble    Codeine Nausea And Vomiting   Contrast Media [Iodinated Diagnostic Agents] Hives    Social History   Socioeconomic History   Marital status: Married    Spouse name: Not on file   Number of children: Not on file   Years of education: Not on file   Highest education level: Not on file  Occupational History   Not on file  Tobacco Use   Smoking status: Former Smoker   Smokeless tobacco: Never Used   Tobacco comment: quit in 1976  Vaping Use   Vaping Use: Never used  Substance and Sexual Activity   Alcohol use: No   Drug use: No   Sexual activity: Not on file  Other Topics Concern   Not on file  Social History Narrative   Lives between [place] and [place] with spouse      Retired Music therapist   Social Determinants of Radio broadcast assistant Strain:    Difficulty of Paying Living Expenses:   Food Insecurity:    Worried About Charity fundraiser in the Last Year:    Arboriculturist in the Last Year:   Transportation Needs:    Film/video editor (Medical):    Lack of Transportation (Non-Medical):   Physical Activity:    Days of Exercise per Week:    Minutes of Exercise per Session:   Stress:    Feeling of Stress :   Social Connections:    Frequency of Communication with Friends and Family:    Frequency  of Social Gatherings with Friends and Family:    Attends Religious Services:    Active Member of Clubs or Organizations:    Attends Music therapist:    Marital Status:   Intimate Partner Violence:    Fear of Current or Ex-Partner:    Emotionally Abused:    Physically Abused:    Sexually Abused:      ROS- All systems are reviewed and negative except as per the HPI above.  Physical Exam: Vitals:   04/03/20 1436  BP: 130/80  Pulse: 62  Weight: 79.6  kg  Height: 5\' 10"  (1.778 m)    GEN- The patient is well appearing elderly male, alert and oriented x 3 today.   Head- normocephalic, atraumatic Eyes-  Sclera clear, conjunctiva pink Ears- hearing intact Oropharynx- clear Neck- supple  Lungs- Clear to ausculation bilaterally, normal work of breathing Heart- Regular rate and rhythm, no murmurs, rubs or gallops  GI- soft, NT, ND, + BS Extremities- no clubbing, cyanosis, or edema MS- no significant deformity or atrophy Skin- no rash or lesion Psych- euthymic mood, full affect Neuro- strength and sensation are intact  Wt Readings from Last 3 Encounters:  04/03/20 79.6 kg  03/06/20 75.8 kg  01/25/20 75.8 kg    EKG today demonstrates A paced rhythm HR 62, PAC, PR 218, QRS 92, QTc 393  Epic records are reviewed at length today  CHA2DS2-VASc Score = 3  The patient's score is based upon: CHF History: 0 HTN History: 1 Age : 2 Diabetes History: 0 Stroke History: 0 Vascular Disease History: 0 Gender: 0      ASSESSMENT AND PLAN: 1. Paroxysmal Atrial Fibrillation (ICD10:  I48.0) The patient's CHA2DS2-VASc score is 3, indicating a 3.2% annual risk of stroke.   S/p afib ablation on 03/06/20 Afib burden greatly reduced. Reassured patient that some afib is normal for 3 months post ablation.  Continue dofetilide 500 mcg BID. QT stable.  Check mag today. Recent Bmet reviewed. Continue Lopressor 25 mg BID Continue Xarelto 20 mg daily with no missed doses  for at least 3 months post ablation.   2. Secondary Hypercoagulable State (ICD10:  D68.69) The patient is at significant risk for stroke/thromboembolism based upon his CHA2DS2-VASc Score of 3.  Continue Rivaroxaban (Xarelto).   3. Obstructive sleep apnea Mild per report. Not on CPAP therapy.  4. HTN Stable, no changes today.   Follow up with Dr Rayann Heman as scheduled.    [facility] [address] [place], [place] [address] [phone] 04/03/2020 3:07 PM

## 2020-04-07 ENCOUNTER — Encounter (INDEPENDENT_AMBULATORY_CARE_PROVIDER_SITE_OTHER): Payer: Self-pay | Admitting: Gastroenterology

## 2020-04-07 ENCOUNTER — Ambulatory Visit (INDEPENDENT_AMBULATORY_CARE_PROVIDER_SITE_OTHER): Payer: Medicare PPO | Admitting: Gastroenterology

## 2020-04-07 ENCOUNTER — Telehealth (INDEPENDENT_AMBULATORY_CARE_PROVIDER_SITE_OTHER): Payer: Self-pay | Admitting: *Deleted

## 2020-04-07 ENCOUNTER — Other Ambulatory Visit: Payer: Self-pay

## 2020-04-07 ENCOUNTER — Encounter (INDEPENDENT_AMBULATORY_CARE_PROVIDER_SITE_OTHER): Payer: Self-pay | Admitting: *Deleted

## 2020-04-07 DIAGNOSIS — R1310 Dysphagia, unspecified: Secondary | ICD-10-CM | POA: Diagnosis not present

## 2020-04-07 DIAGNOSIS — R195 Other fecal abnormalities: Secondary | ICD-10-CM | POA: Diagnosis not present

## 2020-04-07 DIAGNOSIS — Z860100 Personal history of colon polyps, unspecified: Secondary | ICD-10-CM | POA: Insufficient documentation

## 2020-04-07 MED ORDER — PLENVU 140 G PO SOLR: 1.0000 | Freq: Once | ORAL | 0 refills | Status: AC

## 2020-04-07 NOTE — Progress Notes (Signed)
Joshua [name], M.D. Gastroenterology & Hepatology [facility] [address] [place], [place] [address]  [place] [address]  Referring MD: Stoney Bang, MD  I will communicate my assessment and recommendations to the referring MD via EMR. Note: Occasional unusual wording and randomly placed punctuation marks may result from the use of speech recognition technology to transcribe this document"  Chief Complaint:  Dysphagia  History of Present Illness: Joshua [name] is a 79 y.o. male with past medical history of GERD, OSA not requiring CPAP, paroxysmal atrial fibrillation status post ablation on anticoagulation with Xarelto, who presents for evaluation of dysphagia and changes in bowel habits.  States that for the last 6 months he has noticed that when he eats solid food it has been getting stuck in his throat. He reports that he has had to chew food thoroughly and swallowing with water to relieve the symptoms, so it does not happen that often. However, he has at least one episode a week. Never had any episode of vomiting to relief the obstruction but very few times has had to spit it out. Negative hallitosis. No choking episodes.  He has never seen any regurgitated food in his pillow in the morning. He followed with his gastroenterologist in [place] in 2015 for different symptoms in the past (he was forcing himself to swallow his saliva multiple times per day), had an EGD for evaluation of this and he reports that he was found to have a mild narrowing but "it was stretched naturally with the thickness of the scope".  This report is not available to me.  Patient reports that for many years he had loose bowel movements, usually once a day.  He reports that since he started using probiotics a few years ago his stools became more formed. He states recently he has noticed his stools was slightly more loose than usual but it has not  bothered him. He takes Metamucil every night. He has been eating more food with fiber recently. Takes magnesium supplement daily as the is on Tykosin.  The patient denies having any nausea, vomiting, fever, chills, hematochezia, melena, hematemesis, abdominal distention, abdominal pain, diarrhea, jaundice, pruritus or unintentional weight loss. He has lost 35 lb by changing his diet on purpose.  Last [id number] - as above Last Colonoscopy: 2013 - reports had one polyp that was removed.  FHx: neg for any gastrointestinal/liver disease, brother colon cancer at age 36, mother stomach cancer, father prostate cancer Social: smoked a pipe in mid 25s but quit in 55s, neg alcohol or illicit drug use  Past Medical History: Past Medical History:  Diagnosis Date  . Anxiety   . BPH (benign prostatic hyperplasia)   . Depression   . Erectile dysfunction   . ERYTHROCYTOSIS       . GERD (gastroesophageal reflux disease)   . Melanoma ([place]) 2003   L arm removed  . Neuropathy   . OSA (obstructive sleep apnea)    mild,  did not tolerate CPAP  . Paroxysmal atrial fibrillation (HCC)   . PONV (postoperative nausea and vomiting)   . SICK SINUS SYNDROME   .       Marland Kitchen Visual loss    intermittent    Past Surgical History: Past Surgical History:  Procedure Laterality Date  . ATRIAL FIBRILLATION ABLATION N/A 03/06/2020   Procedure: ATRIAL FIBRILLATION ABLATION;  Surgeon: Thompson Grayer, MD;  Location: [facility];  Service: Cardiovascular;  Laterality:  N/A;  . CHOLECYSTECTOMY    . COLONOSCOPY  2013  . INSERT / REPLACE / REMOVE PACEMAKER     implanted by Dr Lovena Le for sick sinus  . LYMPH NODE BIOPSY Left   . PACEMAKER INSERTION    . PPM GENERATOR CHANGEOUT N/A 10/31/2019   Dr Lovena Le,  SJM  . skin cancer removal    . VASECTOMY      Family History: Family History  Problem Relation Age of Onset  . Prostate cancer Father     Social History: Social History   Tobacco Use  Smoking Status  Former Smoker  Smokeless Tobacco Never Used  Tobacco Comment   quit in 1976   Social History   Substance and Sexual Activity  Alcohol Use No   Social History   Substance and Sexual Activity  Drug Use No    Allergies: Allergies  Allergen Reactions  . Sulfonamide Derivatives Other (See Comments)    Breathing trouble   . Codeine Nausea And Vomiting  . Contrast Media [Iodinated Diagnostic Agents] Hives    Medications: Current Outpatient Medications  Medication Sig Dispense Refill  . calcium carbonate (OS-CAL - DOSED IN MG OF ELEMENTAL CALCIUM) 1250 (500 Ca) MG tablet Take 1 tablet by mouth.    . dofetilide (TIKOSYN) 500 MCG capsule TAKE ONE CAPSULE BY MOUTH TWICE DAILY 180 capsule 3  . gabapentin (NEURONTIN) 300 MG capsule Take 300-600 mg by mouth See admin instructions. Take 1 capsule (300 mg) by mouth in the morning, take 1 capsule (300 mg) by mouth at noon, & take 2 capsules (600 mg) by mouth at bedtime.    . magnesium oxide (MAG-OX) 400 MG tablet Take 400 mg by mouth daily. Taking 1 tab daily    . metoprolol tartrate (LOPRESSOR) 25 MG tablet TAKE 1 TABLET BY MOUTH TWICE DAILY 60 tablet 9  . Misc Natural Products (PRO HERBS MEMORY PO) Take by mouth.    . Multiple Vitamins-Minerals (MULTIVITAMIN ADULT EXTRA C PO) Take by mouth.    . pantoprazole (PROTONIX) 40 MG tablet Take 1 tablet (40 mg total) by mouth daily. 45 tablet 0  . POTASSIUM GLUCONATE PO Take by mouth.    . rivaroxaban (XARELTO) 20 MG TABS tablet Take 1 tablet (20 mg total) by mouth daily with supper. 30 tablet 6  . tadalafil (CIALIS) 5 MG tablet Take 5 mg by mouth daily.     Marland Kitchen testosterone cypionate (DEPOTESTOTERONE CYPIONATE) 200 MG/ML injection Inject 200 mg into the muscle every 14 (fourteen) days.     Marland Kitchen acetaminophen (TYLENOL) 325 MG tablet Take 325-650 mg by mouth every 6 (six) hours as needed (for pain.). (Patient not taking: Reported on 04/07/2020)     No current facility-administered medications for this  visit.    Review of Systems: GENERAL: negative for malaise, significant weight loss, night sweats and fever HEENT: No changes in hearing or vision, no nose bleeds or other nasal problems. No trouble swallowing NECK: Negative for lumps, goiter, pain and significant neck swelling RESPIRATORY: Negative for cough, wheezing and shortness of breath CARDIOVASCULAR: Negative for chest pain, leg swelling, palpitations, orthopnea GI: SEE HPI MUSCULOSKELETAL: Negative for joint pain or swelling, back pain, and muscle pain. SKIN: Negative for lesions, rash, and itching. PSYCH: Negative for sleep disturbance, mood disorder and recent psychosocial stressors. HEMATOLOGY Negative for prolonged bleeding, bruising easily, and swollen nodes. ENDOCRINE: Negative for cold or heat intolerance, polyuria, polydipsia and goiter. NEURO: negative for lightheadedness, dizziness, tremor, gait imbalance, syncope and seizures. The  remainder of the review of systems is noncontributory.   Physical Exam: BP (!) 146/80 (BP Location: Right Arm, Patient Position: Sitting)   Pulse 77   Temp 98.5 F (36.9 C) (Oral)   Ht 5\' 10"  (1.778 m)   Wt 174 lb 4.8 oz (79.1 kg)   BMI 25.01 kg/m  GENERAL: The patient is AO x3, in no acute distress. HEENT: Head is normocephalic and atraumatic. EOMI are intact. Mouth is well hydrated and without lesions. NECK: Supple. No masses LUNGS: Clear to auscultation. No presence of rhonchi/wheezing/rales. Adequate chest expansion HEART: RRR, normal s1 and s2. ABDOMEN: Soft, nontender, no guarding, no peritoneal signs, and nondistended. BS +. No masses. EXTREMITIES: Without any cyanosis, clubbing, rash, lesions or edema. NEUROLOGIC: AOx3, no focal motor deficit. SKIN: no jaundice, no rashes  Imaging/Labs: as above  I personally reviewed and interpreted the available labs, imaging and endoscopic files.  Impression and Plan: DEREK LAUGHTER is a 79 y.o. male with past medical history of GERD,  OSA not requiring CPAP, paroxysmal atrial fibrillation status post ablation on anticoagulation with Xarelto, who presents for evaluation of dysphagia and changes in bowel habits.  Regarding his dysphagia, the patient has had recurrent symptoms that have affected his lifestyle importantly.  He has not presented any episodes of pneumonia or any complication due to it.  He has lost weight on purpose by making dietary changes, no red flag signs are present at this moment.  However, given the frequency of his symptoms we will need to investigate this with an EGD with possible dilation of strictures.  He is currently taking a PPI but he has not endorsed any heartburn symptoms, unclear why he is taking the medication but it is not needed for prophylaxis of gastrointestinal bleeding if no other insults or anemia is present.  He is also due for a repeat colonoscopy given his history of colonic polyps.  He is at average risk of colorectal cancer otherwise.  For both procedures, the patient will need to be off anticoagulation 2 days prior.  Per discussion with the patient, this will need to be scheduled after September when he finishes his mandatory 74-month course of Xarelto post A. fib ablation.  Will recheck cardiology to confirm if his [facility] can be stopped 2 days prior to colonoscopy/EGD.  For now, we will proceed with a modified barium swallow to rule out any alterations in his pharynx and upper esophagus explain his symptoms.    Finally, his bowel movements have mildly changed in consistency but this has not affected his lifestyle.  This could be related to intake of magnesium.  However he has not presented any fecal incontinence or watery diarrhea.  Hence, I do not consider it warrants any further investigation at this moment  More than 50% of the office visit was dedicated to discussing the procedure, including the day of and risks involved. Patient understands what the procedure involves including the benefits and any  risks. Patient understands alternatives to the proposed procedure. Risks including (but not limited to) bleeding, tearing of the lining (perforation), rupture of adjacent organs, problems with heart and lung function, infection, and medication reactions. A small percentage of complications may require surgery, hospitalization, repeat endoscopic procedure, and/or transfusion. A small percentage of polyps and other tumors may not be seen.  - Schedule EGD and colonoscopy (After September - obtain OK from cardiology to stop [facility] 2 days before procedure) - Schedule modified barium swallow - Continue with Metamucil intake once a day  All questions were answered.      Harvel Quale, MD

## 2020-04-07 NOTE — Telephone Encounter (Signed)
Patient needs Plenvu (copay card) ° °

## 2020-04-07 NOTE — Patient Instructions (Addendum)
Schedule EGD and colonoscopy (After September, once you can be off Xarelto for a few days) Schedule modified barium swallow (x-ray of the neck) Continue with Metamucil intake once a day

## 2020-04-08 ENCOUNTER — Other Ambulatory Visit (HOSPITAL_COMMUNITY): Payer: Self-pay | Admitting: Specialist

## 2020-04-08 DIAGNOSIS — R1319 Other dysphagia: Secondary | ICD-10-CM

## 2020-04-10 DIAGNOSIS — Z85828 Personal history of other malignant neoplasm of skin: Secondary | ICD-10-CM | POA: Diagnosis not present

## 2020-04-10 DIAGNOSIS — C44311 Basal cell carcinoma of skin of nose: Secondary | ICD-10-CM | POA: Diagnosis not present

## 2020-04-10 DIAGNOSIS — Z08 Encounter for follow-up examination after completed treatment for malignant neoplasm: Secondary | ICD-10-CM | POA: Diagnosis not present

## 2020-04-22 ENCOUNTER — Other Ambulatory Visit: Payer: Self-pay

## 2020-04-22 ENCOUNTER — Encounter (HOSPITAL_COMMUNITY): Payer: Self-pay | Admitting: Speech Pathology

## 2020-04-22 ENCOUNTER — Telehealth (INDEPENDENT_AMBULATORY_CARE_PROVIDER_SITE_OTHER): Payer: Self-pay | Admitting: Gastroenterology

## 2020-04-22 ENCOUNTER — Ambulatory Visit (HOSPITAL_COMMUNITY)
Admission: RE | Admit: 2020-04-22 | Discharge: 2020-04-22 | Disposition: A | Discharge status: Home / Self Care | Payer: Medicare PPO | Source: Ambulatory Visit | Attending: Gastroenterology | Admitting: Gastroenterology

## 2020-04-22 ENCOUNTER — Ambulatory Visit (HOSPITAL_COMMUNITY): Payer: Medicare PPO | Attending: Gastroenterology | Admitting: Speech Pathology

## 2020-04-22 DIAGNOSIS — R1319 Other dysphagia: Secondary | ICD-10-CM

## 2020-04-22 DIAGNOSIS — K219 Gastro-esophageal reflux disease without esophagitis: Secondary | ICD-10-CM | POA: Diagnosis not present

## 2020-04-22 DIAGNOSIS — R1313 Dysphagia, pharyngeal phase: Secondary | ICD-10-CM

## 2020-04-22 DIAGNOSIS — R1310 Dysphagia, unspecified: Secondary | ICD-10-CM | POA: Diagnosis not present

## 2020-04-22 NOTE — Telephone Encounter (Signed)
I called the patient to inform him about the results of his modified barium swallow which showed pharyngeal dysphagia characterized by reduced epiglottic deflection and tongue base retraction resulting in vallecular residue with puree, solids, and pill per documentation by speech and swallow therapy.  The patient reported to the therapist that she would like to follow-up before starting dysphagia therapy.  Consideration for a neurology work-up was recommended.  I called the patient but he did not pick up the phone, I left a detailed voice message explained I would refer him to neurology for further evaluation.  We will proceed with his scheduled procedures, but it is unlikely we will dilate his esophagus at that time as it seems he is alterations are pharyngeal in nature.  Ann, can you please send a referral for neurology to the patient?  Thanks,  Harvel Quale, MD Gastroenterology and Hepatology [facility]

## 2020-04-22 NOTE — Therapy (Signed)
[place], [place], [address] Phone: [phone]   Fax:  [phone]  Modified Barium Swallow  Patient Details  Name: Joshua [name] MRN: [id number] Date of Birth: [birth year] No data recorded  Encounter Date: 04/22/2020   End of Session - 04/22/20 1511    Visit Number 1    Number of Visits 1    Authorization Type [employer]    SLP Start Time 1330    SLP Stop Time  1400    SLP Time Calculation (min) 30 min    Activity Tolerance Patient tolerated treatment well           Past Medical History:  Diagnosis Date  . Anxiety   . BPH (benign prostatic hyperplasia)   . Depression   . Erectile dysfunction   . ERYTHROCYTOSIS   . Essential hypertension, benign   . GERD (gastroesophageal reflux disease)   . Melanoma ([place]) 2003   L arm removed  . Neuropathy   . OSA (obstructive sleep apnea)    mild,  did not tolerate CPAP  . Paroxysmal atrial fibrillation (HCC)   . PONV (postoperative nausea and vomiting)   . SICK SINUS SYNDROME   . TIA 2005   pt does not think he had a TIA  . Visual loss    intermittent    Past Surgical History:  Procedure Laterality Date  . ATRIAL FIBRILLATION ABLATION N/A 03/06/2020   Procedure: ATRIAL FIBRILLATION ABLATION;  Surgeon: Thompson Grayer, MD;  Location: [facility];  Service: Cardiovascular;  Laterality: N/A;  . CHOLECYSTECTOMY    . COLONOSCOPY  2013  . INSERT / REPLACE / REMOVE PACEMAKER     implanted by Dr Lovena Le for sick sinus  . LYMPH NODE BIOPSY Left   . PACEMAKER INSERTION    . PPM GENERATOR CHANGEOUT N/A 10/31/2019   Dr Lovena Le,  [facility]  . skin cancer removal    . VASECTOMY      There were no vitals filed for this visit.   Subjective Assessment - 04/22/20 1454    Subjective "Solid foods get stuck"    Special Tests MBSS    Currently in Pain? No/denies               General - 04/22/20 1455      General Information   Date of Onset 10/24/19    HPI  Joshua [name]. Vi is a 79 yo male who was referred for MBSS by Dr. [name] [place] due to Pt reports of dysphagia to solids foods. Pt reports symptoms started ~6 months ago. His wife reports that his voice sounds "raspy". He had surgery on his nose about a year ago for basal cell carcinoma. He denies h/o of stroke.   Type of Study MBS-Modified Barium Swallow Study    Diet Prior to this Study Regular;Thin liquids    Temperature Spikes Noted No    Respiratory Status Room air    History of Recent Intubation No    Behavior/Cognition Alert;Cooperative;Pleasant mood    Oral Cavity Assessment Within Functional Limits   lingual deviation to the right upon protrusion   Oral Care Completed by SLP No    Oral Cavity - Dentition Adequate natural dentition   partials   Vision Functional for self feeding    Self-Feeding Abilities Able to feed self    Patient Positioning Upright in chair    Baseline Vocal Quality Normal    Volitional Cough Strong  Volitional Swallow Able to elicit    Anatomy Within functional limits    Pharyngeal Secretions Not observed secondary MBS            Adult Oral Care Protocol - 04/22/20 1459      Oral Assessment (Complete on admission/transfer/change in patient condition)   Does patient have any of the following "high(er) risk" factors? None of the above            Oral Preparation/Oral Phase - 04/22/20 1502      Oral Preparation/Oral Phase   Oral Phase Within functional limits      Electrical stimulation - Oral Phase   Was Electrical Stimulation Used No            Pharyngeal Phase - 04/22/20 1502      Pharyngeal Phase   Pharyngeal Phase Impaired      Pharyngeal - Thin   Pharyngeal- Thin Teaspoon Reduced epiglottic inversion    Pharyngeal- Thin Cup Reduced epiglottic inversion    Pharyngeal- Thin Straw Reduced epiglottic inversion      Pharyngeal - Solids   Pharyngeal- Puree Swallow initiation at vallecula;Reduced epiglottic inversion;Reduced  tongue base retraction;Pharyngeal residue - valleculae    Pharyngeal- Regular Delayed swallow initiation-vallecula;Reduced epiglottic inversion;Reduced tongue base retraction;Pharyngeal residue - valleculae    Pharyngeal- Pill Reduced epiglottic inversion;Reduced tongue base retraction;Pharyngeal residue - valleculae      Pharyngeal Phase - Comment   Pharyngeal Comment suspect weakness and reduced sensation in R valleculae, head turn R during the swallow was effective in preventing vallecular residue      Electrical Stimulation - Pharyngeal Phase   Was Electrical Stimulation Used No            Cricopharyngeal Phase - 04/22/20 1508      Cervical Esophageal Phase   Cervical Esophageal Phase Within functional limits             Plan - 04/22/20 1511    Clinical Impression Statement Pt presents with mild to mild/mod sensorimotor based pharyngeal phase dysphagia characterized by reduced epiglottic deflection and tongue base retraction resulting in vallecular residue with puree, solids, and pill. When imaging was turned on and prior to Pt ingesting barium, there was noted food from his lunch ~2 hours prior in the valleculae which eventually cleared and Pt was not aware of. Retention of puree, solids, and pill was not noted/sensed by Pt during the study, however he reports globus sensation when eating at home. A head turn to the Right executed during the initial swallow was effective in preventing vallecular retention. Pt was given two barium tablets as the first on became lodged in right vallecular space and was only removed after several minutes and a head turn to the Left with liquid wash. A second pill was given and Pt was instructed to swallow with thin barium while turning head the Right and the pill was not retained within the pharynx. Chin tuck was ineffective. Esophageal sweep was unremarkable. Recommend regular textures and thin liquids, Pt to turn his head over his Right shoulder when  swallowing solids and pills. He was given a chin tuck against resistance exercise to complete throughout the day to work on strengthening pharyngeal muscles. Pt denies cognitive linguistic changes, balance, changes, or other difficulties (only swallowing). Consider neuro work up given the lingual deviation to the right and suspected right pharyngeal weakness and altered sensation. Pt was offered dysphagia therapy, however he would like to follow up with his doctors first.  Treatment/Interventions Pharyngeal strengthening exercises;Patient/family education    Potential to Achieve Goals Good    Consulted and Agree with Plan of Care Patient           Patient will benefit from skilled therapeutic intervention in order to improve the following deficits and impairments:   Dysphagia, pharyngeal phase     Recommendations/Treatment - 04/22/20 1508      Swallow Evaluation Recommendations   Recommended Consults Consider ENT evaluation;Other (Comment)   consider neuro consult   SLP Diet Recommendations Age appropriate regular;Thin    Liquid Administration via Cup;Straw    Medication Administration Whole meds with liquid   head turn to the Right when taking pills   Supervision Patient able to self feed    Compensations Multiple dry swallows after each bite/sip   head turn to Right with solids   Postural Changes Seated upright at 90 degrees;Remain upright for at least 30 minutes after feeds/meals            Prognosis - 04/22/20 1510      Prognosis   Prognosis for Safe Diet Advancement Good      Individuals Consulted   Consulted and Agree with Results and Recommendations Patient    Report Sent to  Referring physician           Problem List Patient Active Problem List   Diagnosis Date Noted  . Dysphagia 04/07/2020  . Loose bowel movements 04/07/2020  . History of colonic polyps 04/07/2020  . Secondary hypercoagulable state ([place]) 04/03/2020  . Paroxysmal atrial fibrillation ([place])  05/16/2014  . Pacemaker-St.Jude 01/19/2011  . Long term (current) use of anticoagulants 12/18/2010  . SICK SINUS SYNDROME 10/02/2010  . DIZZINESS 10/02/2010  . ERYTHROCYTOSIS 09/17/2010  . SYNCOPE AND COLLAPSE 09/17/2010  . TIA 09/16/2010  . OTHER MALAISE AND FATIGUE 09/16/2010  . PAINFUL RESPIRATION 09/16/2010   Thank you,  Genene Churn, [place]  [facility] 04/22/2020, 3:14 PM  [place], [place], [address] Phone: [phone]   Fax:  [phone]  Name: BUD KAESER MRN: [id number] Date of Birth: [birth year]

## 2020-04-23 NOTE — Telephone Encounter (Signed)
What is diagnosis for referral?

## 2020-04-23 NOTE — Telephone Encounter (Signed)
Referral has been placed. 

## 2020-04-23 NOTE — Telephone Encounter (Signed)
Pharyngeal dysphagia, history of stroke Thanks, Harvel Quale, MD Gastroenterology and Hepatology [facility]

## 2020-05-05 NOTE — Progress Notes (Deleted)
Joshua [name], It appears this was some pre charting I did on the patient but he never showed.

## 2020-05-21 ENCOUNTER — Other Ambulatory Visit (INDEPENDENT_AMBULATORY_CARE_PROVIDER_SITE_OTHER): Payer: Self-pay | Admitting: *Deleted

## 2020-06-04 ENCOUNTER — Encounter: Payer: Self-pay | Admitting: Internal Medicine

## 2020-06-04 ENCOUNTER — Other Ambulatory Visit: Payer: Self-pay

## 2020-06-04 ENCOUNTER — Ambulatory Visit: Payer: Medicare PPO | Admitting: Internal Medicine

## 2020-06-04 VITALS — BP 114/80 | HR 73 | Ht 70.0 in | Wt 172.0 lb

## 2020-06-04 DIAGNOSIS — Z950 Presence of cardiac pacemaker: Secondary | ICD-10-CM | POA: Diagnosis not present

## 2020-06-04 DIAGNOSIS — G4733 Obstructive sleep apnea (adult) (pediatric): Secondary | ICD-10-CM

## 2020-06-04 DIAGNOSIS — D6869 Other thrombophilia: Secondary | ICD-10-CM

## 2020-06-04 DIAGNOSIS — I480 Paroxysmal atrial fibrillation: Secondary | ICD-10-CM

## 2020-06-04 DIAGNOSIS — I10 Essential (primary) hypertension: Secondary | ICD-10-CM

## 2020-06-04 DIAGNOSIS — I495 Sick sinus syndrome: Secondary | ICD-10-CM

## 2020-06-04 LAB — CUP PACEART INCLINIC DEVICE CHECK
Battery Remaining Longevity: 110 mo
Battery Voltage: 3.01 V
Brady Statistic RA Percent Paced: 92 %
Brady Statistic RV Percent Paced: 14 %
Date Time Interrogation Session: 20210908154433
Implantable Lead Implant Date: 20120109
Implantable Lead Implant Date: 20120109
Implantable Lead Location: 753859
Implantable Lead Location: 753860
Implantable Pulse Generator Implant Date: 20210203
Lead Channel Impedance Value: 375 Ohm
Lead Channel Impedance Value: 387.5 Ohm
Lead Channel Pacing Threshold Amplitude: 0.625 V
Lead Channel Pacing Threshold Amplitude: 0.75 V
Lead Channel Pacing Threshold Amplitude: 1.25 V
Lead Channel Pacing Threshold Amplitude: 1.25 V
Lead Channel Pacing Threshold Pulse Width: 0.4 ms
Lead Channel Pacing Threshold Pulse Width: 0.4 ms
Lead Channel Pacing Threshold Pulse Width: 0.5 ms
Lead Channel Pacing Threshold Pulse Width: 0.5 ms
Lead Channel Sensing Intrinsic Amplitude: 12 mV
Lead Channel Sensing Intrinsic Amplitude: 2 mV
Lead Channel Setting Pacing Amplitude: 1.5 V
Lead Channel Setting Pacing Amplitude: 1.625
Lead Channel Setting Pacing Pulse Width: 0.5 ms
Lead Channel Setting Sensing Sensitivity: 3 mV
Pulse Gen Model: 2272
Pulse Gen Serial Number: 9193711

## 2020-06-04 NOTE — Progress Notes (Signed)
PCP: Neale Burly, MD Primary Cardiologist: Dr Lenon Ahmadi is a 79 y.o. male who presents today for routine electrophysiology followup.  Since his recent afib ablation, the patient reports doing very well.  he denies procedure related complications and is pleased with the results of the procedure.  Today, he denies symptoms of palpitations, chest pain, shortness of breath,  lower extremity edema, dizziness, presyncope, or syncope.  The patient is otherwise without complaint today.   Past Medical History:  Diagnosis Date  . Anxiety   . BPH (benign prostatic hyperplasia)   . Depression   . Erectile dysfunction   . ERYTHROCYTOSIS   . Essential hypertension, benign   . GERD (gastroesophageal reflux disease)   . Melanoma ([place]) 2003   L arm removed  . Neuropathy   . OSA (obstructive sleep apnea)    mild,  did not tolerate CPAP  . Paroxysmal atrial fibrillation (HCC)   . PONV (postoperative nausea and vomiting)   . SICK SINUS SYNDROME   . TIA 2005   pt does not think he had a TIA  . Visual loss    intermittent   Past Surgical History:  Procedure Laterality Date  . ATRIAL FIBRILLATION ABLATION N/A 03/06/2020   Procedure: ATRIAL FIBRILLATION ABLATION;  Surgeon: Thompson Grayer, MD;  Location: [facility];  Service: Cardiovascular;  Laterality: N/A;  . CHOLECYSTECTOMY    . COLONOSCOPY  2013  . INSERT / REPLACE / REMOVE PACEMAKER     implanted by Dr Lovena Le for sick sinus  . LYMPH NODE BIOPSY Left   . PACEMAKER INSERTION    . PPM GENERATOR CHANGEOUT N/A 10/31/2019   Dr Lovena Le,  [facility]  . skin cancer removal    . VASECTOMY      ROS- all systems are personally reviewed and negatives except as per HPI above  Current Outpatient Medications  Medication Sig Dispense Refill  . acetaminophen (TYLENOL) 325 MG tablet Take 325-650 mg by mouth every 6 (six) hours as needed (for pain.).     Marland Kitchen calcium carbonate (OS-CAL - DOSED IN MG OF ELEMENTAL CALCIUM) 1250 (500 Ca) MG tablet  Take 1 tablet by mouth.    . dofetilide (TIKOSYN) 500 MCG capsule TAKE ONE CAPSULE BY MOUTH TWICE DAILY 180 capsule 3  . gabapentin (NEURONTIN) 300 MG capsule Take 300-600 mg by mouth See admin instructions. Take 1 capsule (300 mg) by mouth in the morning, take 1 capsule (300 mg) by mouth at noon, & take 2 capsules (600 mg) by mouth at bedtime.    . magnesium oxide (MAG-OX) 400 MG tablet Take 400 mg by mouth daily. Taking 1 tab daily    . metoprolol tartrate (LOPRESSOR) 25 MG tablet TAKE 1 TABLET BY MOUTH TWICE DAILY 60 tablet 9  . Misc Natural Products (PRO HERBS MEMORY PO) Take by mouth.    . Multiple Vitamins-Minerals (MULTIVITAMIN ADULT EXTRA C PO) Take by mouth.    Marland Kitchen POTASSIUM GLUCONATE PO Take by mouth.    . rivaroxaban (XARELTO) 20 MG TABS tablet Take 1 tablet (20 mg total) by mouth daily with supper. 30 tablet 6  . tadalafil (CIALIS) 5 MG tablet Take 5 mg by mouth daily.     Marland Kitchen testosterone cypionate (DEPOTESTOTERONE CYPIONATE) 200 MG/ML injection Inject 200 mg into the muscle every 14 (fourteen) days.     . pantoprazole (PROTONIX) 40 MG tablet Take 1 tablet (40 mg total) by mouth daily. 45 tablet 0   No current facility-administered  medications for this visit.    Physical Exam: Vitals:   06/04/20 1450  BP: 114/80  Pulse: 73  SpO2: 98%  Weight: 172 lb (78 kg)  Height: 5\' 10"  (1.778 m)    GEN- The patient is well appearing, alert and oriented x 3 today.   Head- normocephalic, atraumatic Eyes-  Sclera clear, conjunctiva pink Ears- hearing intact Oropharynx- clear Lungs- Clear to ausculation bilaterally, normal work of breathing Heart- Regular rate and rhythm, no murmurs, rubs or gallops, PMI not laterally displaced GI- soft, NT, ND, + BS Extremities- no clubbing, cyanosis, or edema  EKG tracing ordered today is personally reviewed and shows atrial paced rhythm, PR 236 msec  Assessment and Plan:  1. Paroxysmal atrial fibrillation Doing well s/p ablation chads2vasc score  is 3.  He is on xarelto PPM reveals that his afib is resolved post ablation.  Consider stopping tikosyn if no afib on return.  He requires close follow-up while on tikosyn  2. HTN Stable No change required today  3. Mild OSA Did not tolerate CPAP  4. Sick sinus syndrome Pacemaker function is normal today  Remotes are up to date  Return to see me in 3 months  Thompson Grayer MD, [facility] 06/04/2020 3:01 PM

## 2020-06-04 NOTE — Patient Instructions (Addendum)
Medication Instructions:  Your physician recommends that you continue on your current medications as directed. Please refer to the Current Medication list given to you today.  *If you need a refill on your cardiac medications before your next appointment, please call your pharmacy*  Lab Work: None ordered.  If you have labs (blood work) drawn today and your tests are completely normal, you will receive your results only by: Marland Kitchen MyChart Message (if you have MyChart) OR . A paper copy in the mail If you have any lab test that is abnormal or we need to change your treatment, we will call you to review the results.  Testing/Procedures: None ordered.  Follow-Up: At [facility], you and your health needs are our priority.  As part of our continuing mission to provide you with exceptional heart care, we have created designated Provider Care Teams.  These Care Teams include your primary Cardiologist (physician) and Advanced Practice Providers (APPs -  Physician Assistants and Nurse Practitioners) who all work together to provide you with the care you need, when you need it.  We recommend signing up for the patient portal called "MyChart".  Sign up information is provided on this After Visit Summary.  MyChart is used to connect with patients for Virtual Visits (Telemedicine).  Patients are able to view lab/test results, encounter notes, upcoming appointments, etc.  Non-urgent messages can be sent to your provider as well.   To learn more about what you can do with MyChart, go to [email or web address].    Your next appointment:   Your physician wants you to follow-up in: 3 months in [place].    Remote monitoring is used to monitor your Pacemaker from home. This monitoring reduces the number of office visits required to check your device to one time per year. It allows [place] to keep an eye on the functioning of your device to ensure it is working properly. You are scheduled for a device check from home on  06/17/20. You may send your transmission at any time that day. If you have a wireless device, the transmission will be sent automatically. After your physician reviews your transmission, you will receive a postcard with your next transmission date.  Other Instructions:

## 2020-06-11 ENCOUNTER — Other Ambulatory Visit: Payer: Self-pay

## 2020-06-11 ENCOUNTER — Encounter (HOSPITAL_COMMUNITY)
Admission: RE | Admit: 2020-06-11 | Discharge: 2020-06-11 | Disposition: A | Discharge status: Home / Self Care | Payer: Medicare PPO | Source: Ambulatory Visit | Attending: Gastroenterology | Admitting: Gastroenterology

## 2020-06-11 ENCOUNTER — Other Ambulatory Visit (HOSPITAL_COMMUNITY)
Admission: RE | Admit: 2020-06-11 | Discharge: 2020-06-11 | Disposition: A | Discharge status: Home / Self Care | Payer: Medicare PPO | Source: Ambulatory Visit | Attending: Gastroenterology | Admitting: Gastroenterology

## 2020-06-11 DIAGNOSIS — Z01812 Encounter for preprocedural laboratory examination: Secondary | ICD-10-CM | POA: Insufficient documentation

## 2020-06-11 DIAGNOSIS — Z20822 Contact with and (suspected) exposure to covid-19: Secondary | ICD-10-CM | POA: Insufficient documentation

## 2020-06-11 LAB — SARS CORONAVIRUS 2 (TAT 6-24 HRS): SARS Coronavirus 2: NEGATIVE

## 2020-06-11 NOTE — Patient Instructions (Addendum)
Joshua [name]  06/11/2020     @PREFPERIOPPHARMACY @   Your procedure is scheduled on 06/13/2020.  Report to [facility] at 6:15 A.M.  Call this number if you have problems the morning of surgery:  [phone]   Remember:  Do not eat or drink after midnight.  Take these medicines the morning of surgery with A SIP OF WATER : Gababentin Metoprolol  and Tikosyn    Do not wear jewelry, make-up or nail polish.  Do not wear lotions, powders, or perfumes, or deodorant.  Do not shave 48 hours prior to surgery.  Men may shave face and neck.  Do not bring valuables to the hospital.  [facility] is not responsible for any belongings or valuables.  Contacts, dentures or bridgework may not be worn into surgery.  Leave your suitcase in the car.  After surgery it may be brought to your room.  For patients admitted to the hospital, discharge time will be determined by your treatment team.  Patients discharged the day of surgery will not be allowed to drive home.   Name and phone number of your driver:   wife Special instructions:  N/A Please read over the following fact sheets that you were given. Care and Recovery After Surgery   Upper Endoscopy, Adult Upper endoscopy is a procedure to look inside the upper GI (gastrointestinal) tract. The upper GI tract is made up of:  The part of the body that moves food from your mouth to your stomach (esophagus).  The stomach.  The first part of your small intestine (duodenum). This procedure is also called esophagogastroduodenoscopy (EGD) or gastroscopy. In this procedure, your health care provider passes a thin, flexible tube (endoscope) through your mouth and down your esophagus into your stomach. A small camera is attached to the end of the tube. Images from the camera appear on a monitor in the exam room. During this procedure, your health care provider may also remove a small piece of tissue to be sent to a lab and examined under a microscope  (biopsy). Your health care provider may do an upper endoscopy to diagnose cancers of the upper GI tract. You may also have this procedure to find the cause of other conditions, such as:  Stomach pain.  Heartburn.  Pain or problems when swallowing.  Nausea and vomiting.  Stomach bleeding.  Stomach ulcers. Tell a health care provider about:  Any allergies you have.  All medicines you are taking, including vitamins, herbs, eye drops, creams, and over-the-counter medicines.  Any problems you or family members have had with anesthetic medicines.  Any blood disorders you have.  Any surgeries you have had.  Any medical conditions you have.  Whether you are pregnant or may be pregnant. What are the risks? Generally, this is a safe procedure. However, problems may occur, including:  Infection.  Bleeding.  Allergic reactions to medicines.  A tear or hole (perforation) in the esophagus, stomach, or duodenum. What happens before the procedure? Staying hydrated Follow instructions from your health care provider about hydration, which may include:  Up to 2 hours before the procedure - you may continue to drink clear liquids, such as water, clear fruit juice, black coffee, and plain tea.  Eating and drinking restrictions Follow instructions from your health care provider about eating and drinking, which may include:  8 hours before the procedure - stop eating heavy meals or foods, such as meat, fried foods, or fatty foods.  6 hours before the  procedure - stop eating light meals or foods, such as toast or cereal.  6 hours before the procedure - stop drinking milk or drinks that contain milk.  2 hours before the procedure - stop drinking clear liquids. Medicines Ask your health care provider about:  Changing or stopping your regular medicines. This is especially important if you are taking diabetes medicines or blood thinners.  Taking medicines such as aspirin and  ibuprofen. These medicines can thin your blood. Do not take these medicines unless your health care provider tells you to take them.  Taking over-the-counter medicines, vitamins, herbs, and supplements. General instructions  Plan to have someone take you home from the hospital or clinic.  If you will be going home right after the procedure, plan to have someone with you for 24 hours.  Ask your health care provider what steps will be taken to help prevent infection. What happens during the procedure?   An IV will be inserted into one of your veins.  You may be given one or more of the following: ? A medicine to help you relax (sedative). ? A medicine to numb the throat (local anesthetic).  You will lie on your left side on an exam table.  Your health care provider will pass the endoscope through your mouth and down your esophagus.  Your health care provider will use the scope to check the inside of your esophagus, stomach, and duodenum. Biopsies may be taken.  The endoscope will be removed. The procedure may vary among health care providers and hospitals. What happens after the procedure?  Your blood pressure, heart rate, breathing rate, and blood oxygen level will be monitored until you leave the hospital or clinic.  Do not drive for 24 hours if you were given a sedative during your procedure.  When your throat is no longer numb, you may be given some fluids to drink.  It is up to you to get the results of your procedure. Ask your health care provider, or the department that is doing the procedure, when your results will be ready. Summary  Upper endoscopy is a procedure to look inside the upper GI tract.  During the procedure, an IV will be inserted into one of your veins. You may be given a medicine to help you relax.  A medicine will be used to numb your throat.  The endoscope will be passed through your mouth and down your esophagus. This information is not intended to  replace advice given to you by your health care provider. Make sure you discuss any questions you have with your health care provider. Document Revised: 03/08/2018 Document Reviewed: 02/13/2018 Elsevier Patient Education  [place].  Colonoscopy, Adult A colonoscopy is a procedure to look at the entire large intestine. This procedure is done using a long, thin, flexible tube that has a camera on the end. You may have a colonoscopy:  As a part of normal colorectal screening.  If you have certain symptoms, such as: ? A low number of red blood cells in your blood (anemia). ? Diarrhea that does not go away. ? Pain in your abdomen. ? Blood in your stool. A colonoscopy can help screen for and diagnose medical problems, including:  Tumors.  Extra tissue that grows where mucus forms (polyps).  Inflammation.  Areas of bleeding. Tell your health care provider about:  Any allergies you have.  All medicines you are taking, including vitamins, herbs, eye drops, creams, and over-the-counter medicines.  Any problems  you or family members have had with anesthetic medicines.  Any blood disorders you have.  Any surgeries you have had.  Any medical conditions you have.  Any problems you have had with having bowel movements.  Whether you are pregnant or may be pregnant. What are the risks? Generally, this is a safe procedure. However, problems may occur, including:  Bleeding.  Damage to your intestine.  Allergic reactions to medicines given during the procedure.  Infection. This is rare. What happens before the procedure? Eating and drinking restrictions Follow instructions from your health care provider about eating or drinking restrictions, which may include:  A few days before the procedure: ? Follow a low-fiber diet. ? Avoid nuts, seeds, dried fruit, raw fruits, and vegetables.  1-3 days before the procedure: ? Eat only gelatin dessert or ice pops. ? Drink only  clear liquids, such as water, clear juice, clear broth or bouillon, black coffee or tea, or clear soft drinks or sports drinks. ? Avoid liquids that contain red or purple dye.  The day of the procedure: ? Do not eat solid foods. You may continue to drink clear liquids until up to 2 hours before the procedure. ? Do not eat or drink anything starting 2 hours before the procedure, or within the time period that your health care provider recommends. Bowel prep If you were prescribed a bowel prep to take by mouth (orally) to clean out your colon:  Take it as told by your health care provider. Starting the day before your procedure, you will need to drink a large amount of liquid medicine. The liquid will cause you to have many bowel movements of loose stool until your stool becomes almost clear or light green.  If your skin or the opening between the buttocks (anus) gets irritated from diarrhea, you may relieve the irritation using: ? Wipes with medicine in them, such as adult wet wipes with aloe and vitamin E. ? A product to soothe skin, such as petroleum jelly.  If you vomit while drinking the bowel prep: ? Take a break for up to 60 minutes. ? Begin the bowel prep again. ? Call your health care provider if you keep vomiting or you cannot take the bowel prep without vomiting.  To clean out your colon, you may also be given: ? Laxative medicines. These help you have a bowel movement. ? Instructions for enema use. An enema is liquid medicine injected into your rectum. Medicines Ask your health care provider about:  Changing or stopping your regular medicines or supplements. This is especially important if you are taking iron supplements, diabetes medicines, or blood thinners.  Taking medicines such as aspirin and ibuprofen. These medicines can thin your blood. Do not take these medicines unless your health care provider tells you to take them.  Taking over-the-counter medicines, vitamins,  herbs, and supplements. General instructions  Ask your health care provider what steps will be taken to help prevent infection. These may include washing skin with a germ-killing soap.  Plan to have someone take you home from the hospital or clinic. What happens during the procedure?   An IV will be inserted into one of your veins.  You may be given one or more of the following: ? A medicine to help you relax (sedative). ? A medicine to numb the area (local anesthetic). ? A medicine to make you fall asleep (general anesthetic). This is rarely needed.  You will lie on your side with your knees bent.  The tube will: ? Have oil or gel put on it (be lubricated). ? Be inserted into your anus. ? Be gently eased through all parts of your large intestine.  Air will be sent into your colon to keep it open. This may cause some pressure or cramping.  Images will be taken with the camera and will appear on a screen.  A small tissue sample may be removed to be looked at under a microscope (biopsy). The tissue may be sent to a lab for testing if any signs of problems are found.  If small polyps are found, they may be removed and checked for cancer cells.  When the procedure is finished, the tube will be removed. The procedure may vary among health care providers and hospitals. What happens after the procedure?  Your blood pressure, heart rate, breathing rate, and blood oxygen level will be monitored until you leave the hospital or clinic.  You may have a small amount of blood in your stool.  You may pass gas and have mild cramping or bloating in your abdomen. This is caused by the air that was used to open your colon during the exam.  Do not drive for 24 hours after the procedure.  It is up to you to get the results of your procedure. Ask your health care provider, or the department that is doing the procedure, when your results will be ready. Summary  A colonoscopy is a procedure to  look at the entire large intestine.  Follow instructions from your health care provider about eating and drinking before the procedure.  If you were prescribed an oral bowel prep to clean out your colon, take it as told by your health care provider.  During the colonoscopy, a flexible tube with a camera on its end is inserted into the anus and then passed into the other parts of the large intestine. This information is not intended to replace advice given to you by your health care provider. Make sure you discuss any questions you have with your health care provider. Document Revised: 04/06/2019 Document Reviewed: 04/06/2019 Elsevier Patient Education  [place].

## 2020-06-13 ENCOUNTER — Encounter (HOSPITAL_COMMUNITY)
Admission: RE | Disposition: A | Discharge status: Home / Self Care | Payer: Self-pay | Source: Home / Self Care | Attending: Gastroenterology

## 2020-06-13 ENCOUNTER — Ambulatory Visit (HOSPITAL_COMMUNITY): Payer: Medicare PPO | Admitting: Anesthesiology

## 2020-06-13 ENCOUNTER — Ambulatory Visit (HOSPITAL_COMMUNITY)
Admission: RE | Admit: 2020-06-13 | Discharge: 2020-06-13 | Disposition: A | Discharge status: Home / Self Care | Payer: Medicare PPO | Attending: Gastroenterology | Admitting: Gastroenterology

## 2020-06-13 DIAGNOSIS — K635 Polyp of colon: Secondary | ICD-10-CM | POA: Diagnosis not present

## 2020-06-13 DIAGNOSIS — K648 Other hemorrhoids: Secondary | ICD-10-CM

## 2020-06-13 DIAGNOSIS — Z8673 Personal history of transient ischemic attack (TIA), and cerebral infarction without residual deficits: Secondary | ICD-10-CM | POA: Diagnosis not present

## 2020-06-13 DIAGNOSIS — R1310 Dysphagia, unspecified: Secondary | ICD-10-CM | POA: Diagnosis not present

## 2020-06-13 DIAGNOSIS — N400 Enlarged prostate without lower urinary tract symptoms: Secondary | ICD-10-CM | POA: Insufficient documentation

## 2020-06-13 DIAGNOSIS — Z86010 Personal history of colonic polyps: Secondary | ICD-10-CM | POA: Diagnosis not present

## 2020-06-13 DIAGNOSIS — K219 Gastro-esophageal reflux disease without esophagitis: Secondary | ICD-10-CM | POA: Diagnosis not present

## 2020-06-13 DIAGNOSIS — Z09 Encounter for follow-up examination after completed treatment for conditions other than malignant neoplasm: Secondary | ICD-10-CM

## 2020-06-13 DIAGNOSIS — Z87891 Personal history of nicotine dependence: Secondary | ICD-10-CM | POA: Insufficient documentation

## 2020-06-13 DIAGNOSIS — I10 Essential (primary) hypertension: Secondary | ICD-10-CM | POA: Insufficient documentation

## 2020-06-13 DIAGNOSIS — I480 Paroxysmal atrial fibrillation: Secondary | ICD-10-CM | POA: Insufficient documentation

## 2020-06-13 DIAGNOSIS — Z1211 Encounter for screening for malignant neoplasm of colon: Secondary | ICD-10-CM | POA: Diagnosis not present

## 2020-06-13 DIAGNOSIS — Z79899 Other long term (current) drug therapy: Secondary | ICD-10-CM | POA: Insufficient documentation

## 2020-06-13 DIAGNOSIS — K5730 Diverticulosis of large intestine without perforation or abscess without bleeding: Secondary | ICD-10-CM | POA: Diagnosis not present

## 2020-06-13 DIAGNOSIS — K209 Esophagitis, unspecified without bleeding: Secondary | ICD-10-CM | POA: Diagnosis not present

## 2020-06-13 DIAGNOSIS — I495 Sick sinus syndrome: Secondary | ICD-10-CM | POA: Insufficient documentation

## 2020-06-13 DIAGNOSIS — Z885 Allergy status to narcotic agent status: Secondary | ICD-10-CM | POA: Insufficient documentation

## 2020-06-13 DIAGNOSIS — Z7901 Long term (current) use of anticoagulants: Secondary | ICD-10-CM | POA: Diagnosis not present

## 2020-06-13 DIAGNOSIS — G4733 Obstructive sleep apnea (adult) (pediatric): Secondary | ICD-10-CM | POA: Diagnosis not present

## 2020-06-13 DIAGNOSIS — K2100 Gastro-esophageal reflux disease with esophagitis, without bleeding: Secondary | ICD-10-CM | POA: Diagnosis not present

## 2020-06-13 DIAGNOSIS — R1319 Other dysphagia: Secondary | ICD-10-CM | POA: Diagnosis not present

## 2020-06-13 DIAGNOSIS — D122 Benign neoplasm of ascending colon: Secondary | ICD-10-CM | POA: Diagnosis not present

## 2020-06-13 DIAGNOSIS — Z950 Presence of cardiac pacemaker: Secondary | ICD-10-CM | POA: Diagnosis not present

## 2020-06-13 DIAGNOSIS — Z882 Allergy status to sulfonamides status: Secondary | ICD-10-CM | POA: Insufficient documentation

## 2020-06-13 HISTORY — PX: COLONOSCOPY WITH PROPOFOL: SHX5780

## 2020-06-13 HISTORY — PX: ESOPHAGOGASTRODUODENOSCOPY (EGD) WITH PROPOFOL: SHX5813

## 2020-06-13 HISTORY — PX: POLYPECTOMY: SHX5525

## 2020-06-13 HISTORY — PX: ESOPHAGEAL DILATION: SHX303

## 2020-06-13 LAB — HM COLONOSCOPY

## 2020-06-13 SURGERY — ESOPHAGOGASTRODUODENOSCOPY (EGD) WITH PROPOFOL
Anesthesia: General

## 2020-06-13 MED ORDER — SIMETHICONE 40 MG/0.6ML PO SUSP
ORAL | Status: AC
  Filled 2020-06-13: qty 0.6

## 2020-06-13 MED ORDER — OMEPRAZOLE 40 MG PO CPDR: 40.0000 mg | DELAYED_RELEASE_CAPSULE | Freq: Two times a day (BID) | ORAL | 3 refills | Status: DC

## 2020-06-13 MED ORDER — LIDOCAINE HCL (CARDIAC) PF 100 MG/5ML IV SOSY
PREFILLED_SYRINGE | INTRAVENOUS | Status: DC | PRN
  Administered 2020-06-13: 50 mg via INTRAVENOUS

## 2020-06-13 MED ORDER — STERILE WATER FOR IRRIGATION IR SOLN
Status: DC | PRN
  Administered 2020-06-13: 1.5 mL

## 2020-06-13 MED ORDER — LIDOCAINE VISCOUS HCL 2 % MT SOLN
15.0000 mL | Freq: Once | OROMUCOSAL | Status: AC
  Administered 2020-06-13: 15 mL via OROMUCOSAL

## 2020-06-13 MED ORDER — LACTATED RINGERS IV SOLN: Freq: Once | INTRAVENOUS | Status: AC

## 2020-06-13 MED ORDER — PROPOFOL 500 MG/50ML IV EMUL
INTRAVENOUS | Status: DC | PRN
  Administered 2020-06-13: 150 ug/kg/min via INTRAVENOUS

## 2020-06-13 MED ORDER — PROPOFOL 10 MG/ML IV BOLUS
INTRAVENOUS | Status: AC
  Filled 2020-06-13: qty 40

## 2020-06-13 MED ORDER — LACTATED RINGERS IV SOLN: INTRAVENOUS | Status: DC | PRN

## 2020-06-13 MED ORDER — MIDAZOLAM HCL 2 MG/2ML IJ SOLN
INTRAMUSCULAR | Status: AC
  Filled 2020-06-13: qty 2

## 2020-06-13 MED ORDER — LIDOCAINE VISCOUS HCL 2 % MT SOLN
OROMUCOSAL | Status: AC
  Filled 2020-06-13: qty 15

## 2020-06-13 MED ORDER — PROPOFOL 10 MG/ML IV BOLUS
INTRAVENOUS | Status: DC | PRN
  Administered 2020-06-13: 100 mg via INTRAVENOUS

## 2020-06-13 MED ORDER — PHENYLEPHRINE 40 MCG/ML (10ML) SYRINGE FOR IV PUSH (FOR BLOOD PRESSURE SUPPORT)
PREFILLED_SYRINGE | INTRAVENOUS | Status: AC
  Filled 2020-06-13: qty 10

## 2020-06-13 MED ORDER — PHENYLEPHRINE HCL (PRESSORS) 10 MG/ML IV SOLN
INTRAVENOUS | Status: DC | PRN
  Administered 2020-06-13 (×4): 100 ug via INTRAVENOUS

## 2020-06-13 MED ORDER — EPHEDRINE 5 MG/ML INJ
INTRAVENOUS | Status: AC
  Filled 2020-06-13: qty 10

## 2020-06-13 MED ORDER — EPHEDRINE SULFATE 50 MG/ML IJ SOLN
INTRAMUSCULAR | Status: DC | PRN
  Administered 2020-06-13 (×5): 10 mg via INTRAVENOUS

## 2020-06-13 NOTE — Op Note (Signed)
[facility] Patient Name: Joshua [name] Procedure Date: 06/13/2020 7:07 AM MRN: [id number] Date of Birth: [birth year] Attending MD: [name] ,  CSN: [id number] Age: 79 Admit Type: Outpatient Procedure:                Upper GI endoscopy Indications:              Dysphagia Providers:                [name], Crystal Page, Gwynneth Albright RN, RN Referring MD:              Medicines:                Monitored Anesthesia Care Complications:            No immediate complications. Estimated Blood Loss:     Estimated blood loss: none. Procedure:                Pre-Anesthesia Assessment:                           - Prior to the procedure, a History and Physical                            was performed, and patient medications, allergies                            and sensitivities were reviewed. The patient's                            tolerance of previous anesthesia was reviewed.                           - The risks and benefits of the procedure and the                            sedation options and risks were discussed with the                            patient. All questions were answered and informed                            consent was obtained.                           - ASA Grade Assessment: III - A patient with severe                            systemic disease.                           After obtaining informed consent, the endoscope was                            passed under direct vision. Throughout the  procedure, the patient's blood pressure, pulse, and                            oxygen saturations were monitored continuously. The                            [id number]-[id number] ([id number]) was introduced through the                            mouth, and advanced to the second part of duodenum.                            The upper GI endoscopy was accomplished without                            difficulty. The  patient tolerated the procedure                            well. Scope In: 7:48:20 AM Scope Out: 7:53:48 AM Total Procedure Duration: 0 hours 5 minutes 28 seconds  Findings:      LA Grade B (one or more mucosal breaks greater than 5 mm, not extending       between the tops of two mucosal folds) esophagitis with no bleeding was       found in the lower third of the esophagus.      The entire examined stomach was normal.      The examined duodenum was normal. Impression:               - LA Grade B esophagitis with no bleeding.                           - Normal stomach.                           - Normal examined duodenum.                           - No specimens collected. Moderate Sedation:      Per Anesthesia Care Recommendation:           - Discharge patient to home (ambulatory).                           - Resume previous diet.                           - Use Prilosec (omeprazole) 40 mg PO BID for 2                            months, then decrease to once daily indefinitely.                           - Follow up with neurologist for comprehensive                            evaluation of dysphagia. Procedure Code(s):        ---  Professional ---                           [phone], GC, Esophagogastroduodenoscopy, flexible,                            transoral; diagnostic, including collection of                            specimen(s) by brushing or washing, when performed                            (separate procedure) Diagnosis Code(s):        --- Professional ---                           K20.90, Esophagitis, unspecified without bleeding                           R13.10, Dysphagia, unspecified CPT copyright 2019 American Medical Association. All rights reserved. The codes documented in this report are preliminary and upon coder review may  be revised to meet current compliance requirements. [name], MD [name],  06/13/2020 8:27:46 AM This report has been signed  electronically. Number of Addenda: 0

## 2020-06-13 NOTE — H&P (Signed)
Joshua [name] is an 79 y.o. male.   Chief Complaint: dysphagia and history of colon polyps HPI: 79 year old male with past medical history of GERD, OSA not requiring CPAP, paroxysmal atrial fibrillation status post ablation on anticoagulation with Xarelto, who comes to the hospital for evaluation of dysphagia and evaluation of colonic polyps.  Patient had presented dysphagia to solids for the last 8 months, characterized by difficulty when swallowing solid food and with pills.  He reports having persistent symptoms without significant unintentional weight loss.  Denies any heartburn, odynophagia or any other complaint.  Last EGD was performed in 2015 and at that time "his esophagus was stretched".  No other complaints at the moment. The patient denies having any nausea, vomiting, fever, chills, hematochezia, melena, hematemesis, abdominal distention, abdominal pain, diarrhea, jaundice, pruritus.   His last colonoscopy was performed in 2013, he had one polyp removed with time.  Family history is remarkable for brother with colorectal cancer at age 32.  Patient underwent modified barium swallow, was found to have difficulty swallowing barium tablets as they were located for several minutes in the vallecular space, consideration for dysphagia therapy and neurologic work-up was recommended per speech and swallow due to oropharyngeal dysphagia given suspected right pharyngeal weakness.  Patient was referred to neurologist but he has not seen them yet.  Past Medical History:  Diagnosis Date  . Anxiety   . BPH (benign prostatic hyperplasia)   . Depression   . Erectile dysfunction   . ERYTHROCYTOSIS   . Essential hypertension, benign   . GERD (gastroesophageal reflux disease)   . Melanoma ([place]) 2003   L arm removed  . Neuropathy   . OSA (obstructive sleep apnea)    mild,  did not tolerate CPAP  . Paroxysmal atrial fibrillation ([facility])   . PONV (postoperative nausea and vomiting)   . SICK SINUS  SYNDROME   . TIA 2005   pt does not think he had a TIA  . Visual loss    intermittent    Past Surgical History:  Procedure Laterality Date  . ATRIAL FIBRILLATION ABLATION N/A 03/06/2020   Procedure: ATRIAL FIBRILLATION ABLATION;  Surgeon: Thompson Grayer, MD;  Location: [facility];  Service: Cardiovascular;  Laterality: N/A;  . CHOLECYSTECTOMY    . COLONOSCOPY  2013  . INSERT / REPLACE / REMOVE PACEMAKER     implanted by Dr Lovena Le for sick sinus  . LYMPH NODE BIOPSY Left   . PACEMAKER INSERTION    . PPM GENERATOR CHANGEOUT N/A 10/31/2019   Dr Lovena Le,  [facility]  . skin cancer removal    . VASECTOMY      Family History  Problem Relation Age of Onset  . Prostate cancer Father    Social History:  reports that he has quit smoking. He has never used smokeless tobacco. He reports that he does not drink alcohol and does not use drugs.  Allergies:  Allergies  Allergen Reactions  . Sulfonamide Derivatives Other (See Comments)    Breathing trouble   . Codeine Nausea And Vomiting  . Contrast Media [Iodinated Diagnostic Agents] Hives    Medications Prior to Admission  Medication Sig Dispense Refill  . Apoaequorin (PREVAGEN EXTRA STRENGTH) 20 MG CAPS Take 20 mg by mouth at bedtime.    . Ca Carbonate-Mag Hydroxide (ROLAIDS PO) Take 1 tablet by mouth daily as needed (heartburn).    . Calcium Carb-Cholecalciferol (CALCIUM 600 + D PO) Take 1 tablet by mouth every 8 (eight) hours.    Marland Kitchen  dofetilide (TIKOSYN) 500 MCG capsule TAKE ONE CAPSULE BY MOUTH TWICE DAILY (Patient taking differently: Take 500 mcg by mouth 2 (two) times daily. ) 180 capsule 3  . gabapentin (NEURONTIN) 300 MG capsule Take 300-600 mg by mouth See admin instructions. Take 1 capsule (300 mg) by mouth in the morning, take 1 capsule (300 mg) by mouth at noon, & take 2 capsules (600 mg) by mouth at bedtime.    . magnesium oxide (MAG-OX) 400 MG tablet Take 400 mg by mouth daily.     . metoprolol tartrate (LOPRESSOR) 25 MG tablet  TAKE 1 TABLET BY MOUTH TWICE DAILY (Patient taking differently: Take 25 mg by mouth 2 (two) times daily. ) 60 tablet 9  . Multiple Vitamins-Minerals (MULTIVITAMIN ADULT EXTRA C PO) Take 1 tablet by mouth daily.     Marland Kitchen POTASSIUM GLUCONATE PO Take 1 tablet by mouth daily.     . rivaroxaban (XARELTO) 20 MG TABS tablet Take 1 tablet (20 mg total) by mouth daily with supper. 30 tablet 6  . tadalafil (CIALIS) 5 MG tablet Take 5 mg by mouth daily.     Marland Kitchen testosterone cypionate (DEPOTESTOTERONE CYPIONATE) 200 MG/ML injection Inject 200 mg into the muscle every 14 (fourteen) days.     . pantoprazole (PROTONIX) 40 MG tablet Take 1 tablet (40 mg total) by mouth daily. (Patient not taking: Reported on 06/04/2020) 45 tablet 0    Results for orders placed or performed during the hospital encounter of 06/11/20 (from the past 48 hour(s))  SARS CORONAVIRUS 2 (TAT 6-24 HRS) Nasopharyngeal Nasopharyngeal Swab     Status: None   Collection Time: 06/11/20  9:43 AM   Specimen: Nasopharyngeal Swab  Result Value Ref Range   SARS Coronavirus 2 NEGATIVE NEGATIVE    Comment: (NOTE) SARS-CoV-2 target nucleic acids are NOT DETECTED.  The SARS-CoV-2 RNA is generally detectable in upper and lower respiratory specimens during the acute phase of infection. Negative results do not preclude SARS-CoV-2 infection, do not rule out co-infections with other pathogens, and should not be used as the sole basis for treatment or other patient management decisions. Negative results must be combined with clinical observations, patient history, and epidemiological information. The expected result is Negative.  Fact Sheet for Patients: SugarRoll.be  Fact Sheet for Healthcare Providers: https://www.woods-mathews.com/  This test is not yet approved or cleared by the [place] FDA and  has been authorized for detection and/or diagnosis of SARS-CoV-2 by FDA under an Emergency Use Authorization  (EUA). This EUA will remain  in effect (meaning this test can be used) for the duration of the COVID-19 declaration under Se ction 564(b)(1) of the Act, 21 U.S.C. section 360bbb-3(b)(1), unless the authorization is terminated or revoked sooner.  Performed at [facility], [place] [address] [place], [place] [address]    No results found.  Review of Systems  Constitutional: Negative.   HENT: Positive for trouble swallowing.   Eyes: Negative.   Respiratory: Negative.   Cardiovascular: Negative.   Gastrointestinal: Negative.   Endocrine: Negative.   Genitourinary: Negative.   Musculoskeletal: Negative.   Skin: Negative.   Allergic/Immunologic: Negative.   Neurological: Negative.   Hematological: Negative.   Psychiatric/Behavioral: Negative.     Blood pressure 130/76, pulse 60, temperature 98.3 F (36.8 C), temperature source Oral, resp. rate 20, height 5\' 10"  (1.778 m), SpO2 100 %. Physical Exam  GENERAL: The patient is AO x3, in no acute distress. HEENT: Head is normocephalic and atraumatic. EOMI are intact. Mouth  is well hydrated and without lesions. NECK: Supple. No masses LUNGS: Clear to auscultation. No presence of rhonchi/wheezing/rales. Adequate chest expansion HEART: RRR, normal s1 and s2. ABDOMEN: Soft, nontender, no guarding, no peritoneal signs, and nondistended. BS +. No masses. EXTREMITIES: Without any cyanosis, clubbing, rash, lesions or edema. NEUROLOGIC: AOx3, no focal motor deficit. SKIN: no jaundice, no rashes  Assessment/Plan 79 year old male with past medical history of GERD, OSA not requiring CPAP, paroxysmal atrial fibrillation status post ablation on anticoagulation with Xarelto, who comes to the hospital for evaluation of dysphagia and evaluation of colonic polyps.  Dysphagia is likely oropharyngeal based on modified barium swallow results but we will proceed with EGD with possible esophageal dilation.  Also, we will proceed with colonoscopy  today.  Harvel Quale, MD 06/13/2020, 7:37 AM

## 2020-06-13 NOTE — Addendum Note (Signed)
Addendum  created 06/13/20 0907 by Jonna Munro, CRNA   Flowsheet accepted, Intraprocedure Flowsheets edited

## 2020-06-13 NOTE — Op Note (Signed)
[facility] Patient Name: Joshua [name] Procedure Date: 06/13/2020 7:58 AM MRN: [id number] Date of Birth: 20-Nov-1940 Attending MD: [name] ,  CSN: [id number] Age: 79 Admit Type: Outpatient Procedure:                Colonoscopy Indications:              High risk colon cancer surveillance: Personal                            history of colonic polyps Providers:                [name], Crystal Page, Gwynneth Albright RN, RN Referring MD:              Medicines:                Monitored Anesthesia Care Complications:            No immediate complications. Estimated Blood Loss:     Estimated blood loss: none. Procedure:                Pre-Anesthesia Assessment:                           - Prior to the procedure, a History and Physical                            was performed, and patient medications, allergies                            and sensitivities were reviewed. The patient's                            tolerance of previous anesthesia was reviewed.                           - The risks and benefits of the procedure and the                            sedation options and risks were discussed with the                            patient. All questions were answered and informed                            consent was obtained.                           - ASA Grade Assessment: III - A patient with severe                            systemic disease.                           After obtaining informed consent, the colonoscope  was passed under direct vision. Throughout the                            procedure, the patient's blood pressure, pulse, and                            oxygen saturations were monitored continuously. The                            [id number]-[id number] ([id number]) was introduced through the                            anus and advanced to the the cecum, identified by                             appendiceal orifice and ileocecal valve. The                            colonoscopy was performed without difficulty. The                            patient tolerated the procedure well. The quality                            of the bowel preparation was good. Scope withdrawal                            time was 15 minutes. Scope In: 8:00:39 AM Scope Out: 8:23:22 AM Scope Withdrawal Time: 0 hours 16 minutes 29 seconds  Total Procedure Duration: 0 hours 22 minutes 43 seconds  Findings:      The perianal and digital rectal examinations were normal.      Two sessile polyps were found in the ascending colon. The polyps were 4       mm in size. These polyps were removed with a cold snare. Resection were       complete for both polyps but retrieval was only achieved for one polyp.      Two small-mouthed diverticula were found in the sigmoid colon.      Non-bleeding internal hemorrhoids were found during retroflexion. The       hemorrhoids were medium-sized. Impression:               - Two 4 mm polyps in the ascending colon, removed                            with a cold snare. Resected and retrieved.                           - Diverticulosis in the sigmoid colon.                           - Non-bleeding internal hemorrhoids. Moderate Sedation:      Per Anesthesia Care Recommendation:           - Discharge patient to home (ambulatory).                           -  High fiber diet.                           - Await pathology results.                           - Repeat colonoscopy is not recommended due to                            current age (77 years or older) for surveillance. Procedure Code(s):        --- Professional ---                           [phone], GC, Colonoscopy, flexible; with removal of                            tumor(s), polyp(s), or other lesion(s) by snare                            technique Diagnosis Code(s):        --- Professional ---                           Z86.010,  Personal history of colonic polyps                           K63.5, Polyp of colon                           K64.8, Other hemorrhoids                           K57.30, Diverticulosis of large intestine without                            perforation or abscess without bleeding CPT copyright 2019 American Medical Association. All rights reserved. The codes documented in this report are preliminary and upon coder review may  be revised to meet current compliance requirements. [name], MD [name],  06/13/2020 8:34:59 AM This report has been signed electronically. Number of Addenda: 0

## 2020-06-13 NOTE — Anesthesia Postprocedure Evaluation (Signed)
Anesthesia Post Note  Patient: Joshua [name]  Procedure(s) Performed: ESOPHAGOGASTRODUODENOSCOPY (EGD) WITH PROPOFOL (N/A ) ESOPHAGEAL DILATION (N/A ) COLONOSCOPY WITH PROPOFOL (N/A ) POLYPECTOMY  Patient location during evaluation: PACU Anesthesia Type: General Level of consciousness: awake, oriented, awake and alert and patient cooperative Pain management: satisfactory to patient Vital Signs Assessment: post-procedure vital signs reviewed and stable Respiratory status: spontaneous breathing, respiratory function stable and nonlabored ventilation Cardiovascular status: stable Postop Assessment: no apparent nausea or vomiting Anesthetic complications: no   No complications documented.   Last Vitals:  Vitals:   06/13/20 0642  BP: 130/76  Pulse: 60  Resp: 20  Temp: 36.8 C  SpO2: 100%    Last Pain:  Vitals:   06/13/20 0642  TempSrc: Oral  PainSc: 0-No pain                 Amela Handley

## 2020-06-13 NOTE — Anesthesia Preprocedure Evaluation (Addendum)
Anesthesia Evaluation  Patient identified by MRN, date of birth, ID band Patient awake    Reviewed: Allergy & Precautions, NPO status , Patient's Chart, lab work & pertinent test results, reviewed documented beta blocker date and time   History of Anesthesia Complications (+) PONV and history of anesthetic complications  Airway Mallampati: III  TM Distance: >3 FB Neck ROM: Full    Dental  (+) Partial Lower, Partial Upper, Missing, Dental Advisory Given   Pulmonary sleep apnea , former smoker,    Pulmonary exam normal breath sounds clear to auscultation       Cardiovascular Exercise Tolerance: Good hypertension, Pt. on medications and Pt. on home beta blockers Normal cardiovascular exam+ pacemaker  Rhythm:Regular Rate:Normal     Neuro/Psych PSYCHIATRIC DISORDERS Anxiety Depression    GI/Hepatic Neg liver ROS, GERD  ,  Endo/Other  negative endocrine ROS  Renal/GU negative Renal ROS     Musculoskeletal negative musculoskeletal ROS (+)   Abdominal   Peds  Hematology negative hematology ROS (+)   Anesthesia Other Findings   Reproductive/Obstetrics negative OB ROS                           Anesthesia Physical Anesthesia Plan  ASA: III  Anesthesia Plan: General   Post-op Pain Management:    Induction: Intravenous  PONV Risk Score and Plan: TIVA  Airway Management Planned: Nasal Cannula and Natural Airway  Additional Equipment:   Intra-op Plan:   Post-operative Plan:   Informed Consent: I have reviewed the patients History and Physical, chart, labs and discussed the procedure including the risks, benefits and alternatives for the proposed anesthesia with the patient or authorized representative who has indicated his/her understanding and acceptance.     Dental advisory given  Plan Discussed with: CRNA and Surgeon  Anesthesia Plan Comments:         Anesthesia Quick  Evaluation

## 2020-06-13 NOTE — Transfer of Care (Signed)
Immediate Anesthesia Transfer of Care Note  Patient: Joshua [name]  Procedure(s) Performed: ESOPHAGOGASTRODUODENOSCOPY (EGD) WITH PROPOFOL (N/A ) ESOPHAGEAL DILATION (N/A ) COLONOSCOPY WITH PROPOFOL (N/A ) POLYPECTOMY  Patient Location: PACU  Anesthesia Type:General  Level of Consciousness: awake, alert , oriented and patient cooperative  Airway & Oxygen Therapy: Patient Spontanous Breathing  Post-op Assessment: Report given to RN, Post -op Vital signs reviewed and stable and Patient moving all extremities X 4  Post vital signs: Reviewed and stable  Last Vitals:  Vitals Value Taken Time  BP    Temp    Pulse 68 06/13/20 0828  Resp 17 06/13/20 0828  SpO2 100 % 06/13/20 0828  Vitals shown include unvalidated device data.  Last Pain:  Vitals:   06/13/20 0642  TempSrc: Oral  PainSc: 0-No pain      Patients Stated Pain Goal: 7 (23/34/35 6861)  Complications: No complications documented.

## 2020-06-13 NOTE — Addendum Note (Signed)
Addendum  created 06/13/20 1350 by Jonna Munro, CRNA   Intraprocedure Event edited, Intraprocedure Staff edited

## 2020-06-13 NOTE — Discharge Instructions (Signed)
You are being discharged to home.  Resume your previous diet.  Take Prilosec (omeprazole) 40 mg by mouth twice a day for two months, then continue once a day indefinitely.  Follow up with neurologist for comprehensive evaluation of swallowing difficulty. We are waiting for your pathology results.  Your physician has indicated that a repeat colonoscopy is not recommended due to your current age (20 years or older) for surveillance.

## 2020-06-16 LAB — SURGICAL PATHOLOGY

## 2020-06-17 ENCOUNTER — Ambulatory Visit (INDEPENDENT_AMBULATORY_CARE_PROVIDER_SITE_OTHER): Payer: Medicare PPO | Admitting: *Deleted

## 2020-06-17 DIAGNOSIS — I495 Sick sinus syndrome: Secondary | ICD-10-CM

## 2020-06-17 LAB — CUP PACEART REMOTE DEVICE CHECK
Battery Remaining Longevity: 112 mo
Battery Remaining Percentage: 95.5 %
Battery Voltage: 3.01 V
Brady Statistic AP VP Percent: 12 %
Brady Statistic AP VS Percent: 78 %
Brady Statistic AS VP Percent: 1 %
Brady Statistic AS VS Percent: 9.5 %
Brady Statistic RA Percent Paced: 89 %
Brady Statistic RV Percent Paced: 12 %
Date Time Interrogation Session: 20210921135536
Implantable Lead Implant Date: 20120109
Implantable Lead Implant Date: 20120109
Implantable Lead Location: 753859
Implantable Lead Location: 753860
Implantable Pulse Generator Implant Date: 20210203
Lead Channel Impedance Value: 360 Ohm
Lead Channel Impedance Value: 380 Ohm
Lead Channel Pacing Threshold Amplitude: 0.5 V
Lead Channel Pacing Threshold Amplitude: 1.125 V
Lead Channel Pacing Threshold Pulse Width: 0.4 ms
Lead Channel Pacing Threshold Pulse Width: 0.5 ms
Lead Channel Sensing Intrinsic Amplitude: 12 mV
Lead Channel Sensing Intrinsic Amplitude: 2.3 mV
Lead Channel Setting Pacing Amplitude: 1.375
Lead Channel Setting Pacing Amplitude: 1.5 V
Lead Channel Setting Pacing Pulse Width: 0.5 ms
Lead Channel Setting Sensing Sensitivity: 3 mV
Pulse Gen Model: 2272
Pulse Gen Serial Number: 9193711

## 2020-06-18 ENCOUNTER — Encounter (HOSPITAL_COMMUNITY): Payer: Self-pay | Admitting: Gastroenterology

## 2020-06-18 NOTE — Progress Notes (Signed)
Remote pacemaker transmission.   

## 2020-06-27 ENCOUNTER — Ambulatory Visit (INDEPENDENT_AMBULATORY_CARE_PROVIDER_SITE_OTHER): Payer: Medicare PPO | Admitting: Neurology

## 2020-06-27 ENCOUNTER — Other Ambulatory Visit: Payer: Self-pay

## 2020-06-27 ENCOUNTER — Encounter: Payer: Self-pay | Admitting: Neurology

## 2020-06-27 VITALS — BP 145/91 | HR 89 | Ht 70.0 in | Wt 176.0 lb

## 2020-06-27 DIAGNOSIS — E538 Deficiency of other specified B group vitamins: Secondary | ICD-10-CM

## 2020-06-27 DIAGNOSIS — R1310 Dysphagia, unspecified: Secondary | ICD-10-CM | POA: Diagnosis not present

## 2020-06-27 NOTE — Progress Notes (Signed)
Reason for visit: Dysphagia, history of neuropathy  Referring physician: Dr. Olena Leatherwood is a 79 y.o. male  History of present illness:  Joshua [name] is a 79 year old right-handed [place] male with a history of onset of difficulty with swallowing that began in early 2021.  The patient indicates that he has had some problems with feeling something caught in his throat and sometimes he will bring up food that he has eaten.  The patient has also noted a raspy quality to his voice at times.  He has undergone a modified barium swallow in July 2021 that showed evidence of some slight weakness of the pharyngeal muscles on the right side, his vallecula on the right has enlarged and is capturing food or pills.  He was told to turn his head to the right at times with swallowing and to do exercises to help strengthen the throat.  There was some question whether he may have had a cerebrovascular event.  The patient has also undergone an upper endoscopy evaluation that shows evidence of esophagitis, he was placed on medications for this recently.  The patient reports no focal numbness or weakness of the face, arms, legs.  He has not any changes in balance.  He reports no double vision or loss of vision or headaches or dizziness.  He denies issues controlling the bowels or the bladder.  He does have a history of atrial fibrillation, he is on chronic anticoagulation therapy.  He has a pacemaker placement, he indicates that the pacemaker is MRI compatible.  He is sent to this office for an evaluation.  Greater than 10 years ago, he was diagnosed with a neuropathy involving the legs, he has cold sensations in the feet and occasional burning sensations between the knee and ankle at nighttime.  He denies any significant balance issues with this.  Past Medical History:  Diagnosis Date   Anxiety    BPH (benign prostatic hyperplasia)    Depression    Erectile dysfunction    ERYTHROCYTOSIS    Essential  hypertension, benign    GERD (gastroesophageal reflux disease)    Melanoma ([place]) 2003   L arm removed   Neuropathy    OSA (obstructive sleep apnea)    mild,  did not tolerate CPAP   Paroxysmal atrial fibrillation (HCC)    PONV (postoperative nausea and vomiting)    SICK SINUS SYNDROME    TIA 2005   pt does not think he had a TIA   Visual loss    intermittent    Past Surgical History:  Procedure Laterality Date   ATRIAL FIBRILLATION ABLATION N/A 03/06/2020   Procedure: ATRIAL FIBRILLATION ABLATION;  Surgeon: Thompson Grayer, MD;  Location: [facility];  Service: Cardiovascular;  Laterality: N/A;   CHOLECYSTECTOMY     COLONOSCOPY  2013   COLONOSCOPY WITH PROPOFOL N/A 06/13/2020   Procedure: COLONOSCOPY WITH PROPOFOL;  Surgeon: Harvel Quale, MD;  [facility];  Service: Gastroenterology;  Laterality: N/A;   ESOPHAGEAL DILATION N/A 06/13/2020   Procedure: ESOPHAGEAL DILATION;  Surgeon: Harvel Quale, MD;  [facility];  Service: Gastroenterology;  Laterality: N/A;   ESOPHAGOGASTRODUODENOSCOPY (EGD) WITH PROPOFOL N/A 06/13/2020   Procedure: ESOPHAGOGASTRODUODENOSCOPY (EGD) WITH PROPOFOL;  Surgeon: Harvel Quale, MD;  [facility];  Service: Gastroenterology;  Laterality: N/A;  [place] / REPLACE / REMOVE PACEMAKER     implanted by Dr Lovena Le for sick sinus   LYMPH  NODE BIOPSY Left    PACEMAKER INSERTION     POLYPECTOMY  06/13/2020   Procedure: POLYPECTOMY;  Surgeon: Harvel Quale, MD;  [facility];  Service: Gastroenterology;;  ascendiing colon polyp    PPM GENERATOR CHANGEOUT N/A 10/31/2019   Dr Lovena Le,  [facility]   skin cancer removal     VASECTOMY      Family History  Problem Relation Age of Onset   Prostate cancer Father     Social history:  reports that he has quit smoking. He has never used smokeless tobacco. He reports that he does not drink alcohol and does  not use drugs.  Medications:  Prior to Admission medications   Medication Sig Start Date End Date Taking? Authorizing Provider  Apoaequorin (PREVAGEN EXTRA STRENGTH) 20 MG CAPS Take 20 mg by mouth at bedtime.   Yes [provider]  Ca Carbonate-Mag Hydroxide (ROLAIDS PO) Take 1 tablet by mouth daily as needed (heartburn).   Yes [provider]  Calcium Carb-Cholecalciferol (CALCIUM 600 + D PO) Take 1 tablet by mouth every 8 (eight) hours.   Yes [provider]  dofetilide (TIKOSYN) 500 MCG capsule TAKE ONE CAPSULE BY MOUTH TWICE DAILY Patient taking differently: Take 500 mcg by mouth 2 (two) times daily.  03/25/20  Yes Evans Lance, MD  gabapentin (NEURONTIN) 300 MG capsule Take 300-600 mg by mouth See admin instructions. Take 1 capsule (300 mg) by mouth in the morning, take 1 capsule (300 mg) by mouth at noon, & take 2 capsules (600 mg) by mouth at bedtime. 03/21/13  Yes [provider]  magnesium oxide (MAG-OX) 400 MG tablet Take 400 mg by mouth daily.    Yes [provider]  metoprolol tartrate (LOPRESSOR) 25 MG tablet TAKE 1 TABLET BY MOUTH TWICE DAILY Patient taking differently: Take 25 mg by mouth 2 (two) times daily.  04/01/20  Yes Evans Lance, MD  Multiple Vitamins-Minerals (MULTIVITAMIN ADULT EXTRA C PO) Take 1 tablet by mouth daily.    Yes [provider]  omeprazole (PRILOSEC) 40 MG capsule Take 1 capsule (40 mg total) by mouth 2 (two) times daily. 06/13/20  Yes Harvel Quale, MD  POTASSIUM GLUCONATE PO Take 1 tablet by mouth daily.    Yes [provider]  rivaroxaban (XARELTO) 20 MG TABS tablet Take 1 tablet (20 mg total) by mouth daily with supper. 01/29/20  Yes Evans Lance, MD  tadalafil (CIALIS) 5 MG tablet Take 5 mg by mouth daily.    Yes [provider]  testosterone cypionate (DEPOTESTOTERONE CYPIONATE) 200 MG/ML injection Inject 200 mg into the muscle every 14 (fourteen) days.    Yes  [provider]      Allergies  Allergen Reactions   Sulfonamide Derivatives Other (See Comments)    Breathing trouble    Codeine Nausea And Vomiting   Contrast Media [Iodinated Diagnostic Agents] Hives    ROS:  Out of a complete 14 system review of symptoms, the patient complains only of the following symptoms, and all other reviewed systems are negative.  Problem swallowing Numbness in the feet  Blood pressure (!) 145/91, pulse 89, height 5\' 10"  (1.778 m), weight 176 lb (79.8 kg).  Physical Exam  General: The patient is alert and cooperative at the time of the examination.  Eyes: Pupils are equal, round, and reactive to light. Discs are flat bilaterally.  Neck: The neck is supple, no carotid bruits are noted.  Respiratory: The respiratory examination  is clear.  Cardiovascular: The cardiovascular examination reveals a regular rate and rhythm, no obvious murmurs or rubs are noted.  Skin: Extremities are without significant edema.  Neurologic Exam  Mental status: The patient is alert and oriented x 3 at the time of the examination. The patient has apparent normal recent and remote memory, with an apparently normal attention span and concentration ability.  Cranial nerves: Facial symmetry is present. There is good sensation of the face to pinprick and soft touch bilaterally. The strength of the facial muscles and the muscles to head turning and shoulder shrug are normal bilaterally. Speech is well enunciated, no aphasia or dysarthria is noted.  At times, there is a raspy quality to his speech.  Extraocular movements are full. Visual fields are full. The tongue is midline, and the patient has symmetric elevation of the soft palate. No obvious hearing deficits are noted.  Motor: The motor testing reveals 5 over 5 strength of all 4 extremities. Good symmetric motor tone is noted throughout.  Sensory: Sensory testing is intact to pinprick, soft touch, vibration  sensation, and position sense on all 4 extremities. No evidence of extinction is noted.  Coordination: Cerebellar testing reveals good finger-nose-finger and heel-to-shin bilaterally.  Gait and station: Gait is normal. Tandem gait is normal. Romberg is negative. No drift is seen.  Reflexes: Deep tendon reflexes are symmetric and normal bilaterally, with exception of some reduction of ankle jerk reflexes bilaterally.. Toes are downgoing bilaterally.   Assessment/Plan:  1.  Dysphagia  2.  History of peripheral neuropathy  3.  Atrial fibrillation, on anticoagulation  The patient does have some risk factors for stroke events, but his neurologic examination is relatively unremarkable with exception of a raspy quality to his speech.  The patient does have reflux problems which may cause some vocal cord irritation.  He is being treated for this currently.  He has a prior history of a peripheral neuropathy dating back greater than 10 years.  The patient does have a pacemaker in place, I will try to find out whether this is MRI compatible, if not, we will get a CT scan of the brain.  He will have blood work done today.  Fortunately, the dysphagia issue has been stable, not progressive.  Jill Alexanders MD 06/27/2020 9:23 AM  [facility] [address] [place], [place] [address]  Phone [phone] Fax [phone]

## 2020-06-30 LAB — ENA+DNA/DS+SJORGEN'S
ENA RNP Ab: <0.2 AI (ref 0.0–0.9)
ENA SM Ab Ser-aCnc: <0.2 AI (ref 0.0–0.9)
ENA SSA (RO) Ab: <0.2 AI (ref 0.0–0.9)
ENA SSB (LA) Ab: <0.2 AI (ref 0.0–0.9)
dsDNA Ab: 22 IU/mL — ABNORMAL HIGH (ref 0–9)

## 2020-06-30 LAB — ACETYLCHOLINE RECEPTOR, BINDING: AChR Binding Ab, Serum: <0.03 nmol/L (ref 0.00–0.24)

## 2020-06-30 LAB — VITAMIN B12: Vitamin B-12: 646 pg/mL (ref 232–1245)

## 2020-06-30 LAB — B. BURGDORFI ANTIBODIES: Lyme IgG/IgM Ab: <0.91 {ISR} (ref 0.00–0.90)

## 2020-06-30 LAB — SEDIMENTATION RATE: Sed Rate: 2 mm/hr (ref 0–30)

## 2020-06-30 LAB — ANGIOTENSIN CONVERTING ENZYME: Angio Convert Enzyme: 25 U/L (ref 14–82)

## 2020-06-30 LAB — ANA W/REFLEX: Anti Nuclear Antibody (ANA): POSITIVE — AB

## 2020-07-01 ENCOUNTER — Telehealth: Payer: Self-pay | Admitting: Neurology

## 2020-07-01 DIAGNOSIS — R1312 Dysphagia, oropharyngeal phase: Secondary | ICD-10-CM

## 2020-07-01 NOTE — Telephone Encounter (Signed)
I called the patient.  The cardiac pacemaker apparently is MRI compatible, I will get an MRI set up at [facility] as per patient desire.

## 2020-07-02 ENCOUNTER — Encounter: Payer: Self-pay | Admitting: Internal Medicine

## 2020-07-02 NOTE — Telephone Encounter (Signed)
Joshua [name]: [id number] (exp. 07/02/20 to 08/01/20) faxed pacemaker info to [facility] to make sure it is safe and for them to reach out to the patient to schedule. Since he has a pacemaker they schedule them at [facility].

## 2020-07-02 NOTE — Telephone Encounter (Signed)
[employer] pending  

## 2020-07-03 NOTE — Telephone Encounter (Signed)
Patient is scheduled at [facility] for 07/11/20.

## 2020-07-07 DIAGNOSIS — I482 Chronic atrial fibrillation, unspecified: Secondary | ICD-10-CM | POA: Diagnosis not present

## 2020-07-07 DIAGNOSIS — Z6823 Body mass index (BMI) 23.0-23.9, adult: Secondary | ICD-10-CM | POA: Diagnosis not present

## 2020-07-07 DIAGNOSIS — N401 Enlarged prostate with lower urinary tract symptoms: Secondary | ICD-10-CM | POA: Diagnosis not present

## 2020-07-07 DIAGNOSIS — I10 Essential (primary) hypertension: Secondary | ICD-10-CM | POA: Diagnosis not present

## 2020-07-07 DIAGNOSIS — K2100 Gastro-esophageal reflux disease with esophagitis, without bleeding: Secondary | ICD-10-CM | POA: Diagnosis not present

## 2020-07-07 NOTE — Progress Notes (Signed)
This encounter was created in error - please disregard.  This encounter was created in error - please disregard.

## 2020-07-11 ENCOUNTER — Ambulatory Visit (HOSPITAL_COMMUNITY)
Admission: RE | Admit: 2020-07-11 | Discharge: 2020-07-11 | Disposition: A | Discharge status: Home / Self Care | Payer: Medicare PPO | Source: Ambulatory Visit | Attending: Neurology | Admitting: Neurology

## 2020-07-11 DIAGNOSIS — I10 Essential (primary) hypertension: Secondary | ICD-10-CM | POA: Diagnosis not present

## 2020-07-11 DIAGNOSIS — Z125 Encounter for screening for malignant neoplasm of prostate: Secondary | ICD-10-CM | POA: Diagnosis not present

## 2020-07-11 DIAGNOSIS — R1312 Dysphagia, oropharyngeal phase: Secondary | ICD-10-CM | POA: Diagnosis not present

## 2020-07-11 DIAGNOSIS — E782 Mixed hyperlipidemia: Secondary | ICD-10-CM | POA: Diagnosis not present

## 2020-07-11 DIAGNOSIS — R29818 Other symptoms and signs involving the nervous system: Secondary | ICD-10-CM | POA: Diagnosis not present

## 2020-07-11 DIAGNOSIS — Z1389 Encounter for screening for other disorder: Secondary | ICD-10-CM | POA: Diagnosis not present

## 2020-07-15 NOTE — Progress Notes (Addendum)
Patient back today for repeat scan of brain. Missing some images from previous scan that was done on Friday 10/15. Transmission sent. Verbal orders received from [place] PA for [place] at [facility]

## 2020-07-15 NOTE — Progress Notes (Signed)
Informed of MRI for today.   Device system confirmed to be MRI conditional, with implant date > 6 weeks ago and no evidence of abandoned or epicardial leads in review of most recent CXR Interrogation from today reviewed, pt is currently AP-VS at ~60 bpm Change device settings for MRI to DOO at 85 bpm  Tachy-therapies to off if applicable.   Program device back to pre-MRI settings after completion of exam.  Shirley Friar, PA-C  07/15/2020 1:06 PM

## 2020-07-15 NOTE — Progress Notes (Signed)
MRI complete. Patient returned to original settings. Ambulatory at d/c

## 2020-07-17 ENCOUNTER — Telehealth: Payer: Self-pay | Admitting: Neurology

## 2020-07-17 NOTE — Telephone Encounter (Signed)
  I called the patient.  The MRI shows minimal Holzheimer matter changes, no overt stroke that would cause right pharyngeal weakness.  There may be some impingement of the left glossopharyngeal nerve by the anterior inferior cerebellar artery, but the pharyngeal weakness is on the right side, not the left.  I am not sure that this study explains the patient's unilateral right-sided pharyngeal weakness.   MRI brain 07/16/20:  IMPRESSION: 1. No acute intracranial abnormality. 2. A left AICA loop abuts the cisternal segment of left cranial nerve 9 without displacement. 3. Small amount of T2 hyperintense Golab matter lesions, likely related to chronic microangiopathy.

## 2020-07-21 ENCOUNTER — Telehealth: Payer: Self-pay | Admitting: Neurology

## 2020-07-21 NOTE — Telephone Encounter (Signed)
This patient was brought back for a MRI with thin cuts of the brainstem, this showed no pathology that would lead to swallowing problems for the patient.

## 2020-09-16 ENCOUNTER — Ambulatory Visit (INDEPENDENT_AMBULATORY_CARE_PROVIDER_SITE_OTHER): Payer: Medicare PPO

## 2020-09-16 DIAGNOSIS — I495 Sick sinus syndrome: Secondary | ICD-10-CM | POA: Diagnosis not present

## 2020-09-16 LAB — CUP PACEART REMOTE DEVICE CHECK
Battery Remaining Longevity: 110 mo
Battery Remaining Percentage: 95.5 %
Battery Voltage: 3.01 V
Brady Statistic AP VP Percent: 12 %
Brady Statistic AP VS Percent: 79 %
Brady Statistic AS VP Percent: 1 %
Brady Statistic AS VS Percent: 7.7 %
Brady Statistic RA Percent Paced: 91 %
Brady Statistic RV Percent Paced: 13 %
Date Time Interrogation Session: 20211221020015
Implantable Lead Implant Date: 20120109
Implantable Lead Implant Date: 20120109
Implantable Lead Location: 753859
Implantable Lead Location: 753860
Implantable Pulse Generator Implant Date: 20210203
Lead Channel Impedance Value: 390 Ohm
Lead Channel Impedance Value: 400 Ohm
Lead Channel Pacing Threshold Amplitude: 0.5 V
Lead Channel Pacing Threshold Amplitude: 1.25 V
Lead Channel Pacing Threshold Pulse Width: 0.4 ms
Lead Channel Pacing Threshold Pulse Width: 0.5 ms
Lead Channel Sensing Intrinsic Amplitude: 12 mV
Lead Channel Sensing Intrinsic Amplitude: 2.1 mV
Lead Channel Setting Pacing Amplitude: 1.5 V
Lead Channel Setting Pacing Amplitude: 1.5 V
Lead Channel Setting Pacing Pulse Width: 0.5 ms
Lead Channel Setting Sensing Sensitivity: 3 mV
Pulse Gen Model: 2272
Pulse Gen Serial Number: 9193711

## 2020-09-17 ENCOUNTER — Other Ambulatory Visit: Payer: Self-pay | Admitting: Internal Medicine

## 2020-09-17 ENCOUNTER — Encounter (INDEPENDENT_AMBULATORY_CARE_PROVIDER_SITE_OTHER): Payer: Self-pay | Admitting: *Deleted

## 2020-09-17 NOTE — Telephone Encounter (Signed)
Has Xarelto appt 10/08/2020.  Will get Lab work (CBC & BMP) at that time.

## 2020-09-29 ENCOUNTER — Other Ambulatory Visit (INDEPENDENT_AMBULATORY_CARE_PROVIDER_SITE_OTHER): Payer: Self-pay | Admitting: Gastroenterology

## 2020-09-29 NOTE — Telephone Encounter (Signed)
Last seen 04/07/2020 for Dysphagia by Dr. Levon Hedger

## 2020-09-30 NOTE — Progress Notes (Signed)
Remote pacemaker transmission.   

## 2020-10-03 ENCOUNTER — Ambulatory Visit: Payer: Medicare PPO | Admitting: Internal Medicine

## 2020-10-03 ENCOUNTER — Other Ambulatory Visit: Payer: Self-pay | Admitting: *Deleted

## 2020-10-03 ENCOUNTER — Telehealth: Payer: Self-pay | Admitting: *Deleted

## 2020-10-03 ENCOUNTER — Encounter: Payer: Self-pay | Admitting: Internal Medicine

## 2020-10-03 VITALS — BP 120/70 | HR 78 | Resp 16 | Ht 70.0 in | Wt 183.4 lb

## 2020-10-03 DIAGNOSIS — Z79899 Other long term (current) drug therapy: Secondary | ICD-10-CM

## 2020-10-03 DIAGNOSIS — I480 Paroxysmal atrial fibrillation: Secondary | ICD-10-CM

## 2020-10-03 DIAGNOSIS — I495 Sick sinus syndrome: Secondary | ICD-10-CM

## 2020-10-03 DIAGNOSIS — D6869 Other thrombophilia: Secondary | ICD-10-CM

## 2020-10-03 LAB — CUP PACEART INCLINIC DEVICE CHECK
Battery Remaining Longevity: 109 mo
Battery Voltage: 3.01 V
Brady Statistic RA Percent Paced: 92 %
Brady Statistic RV Percent Paced: 13 %
Date Time Interrogation Session: 20220107083017
Implantable Lead Implant Date: 20120109
Implantable Lead Implant Date: 20120109
Implantable Lead Location: 753859
Implantable Lead Location: 753860
Implantable Pulse Generator Implant Date: 20210203
Lead Channel Impedance Value: 362.5 Ohm
Lead Channel Impedance Value: 375 Ohm
Lead Channel Pacing Threshold Amplitude: 0.625 V
Lead Channel Pacing Threshold Amplitude: 1.25 V
Lead Channel Pacing Threshold Pulse Width: 0.4 ms
Lead Channel Pacing Threshold Pulse Width: 0.5 ms
Lead Channel Sensing Intrinsic Amplitude: 12 mV
Lead Channel Sensing Intrinsic Amplitude: 2.6 mV
Lead Channel Setting Pacing Amplitude: 1.625
Lead Channel Setting Pacing Amplitude: 1.625
Lead Channel Setting Pacing Pulse Width: 0.5 ms
Lead Channel Setting Sensing Sensitivity: 3 mV
Pulse Gen Model: 2272
Pulse Gen Serial Number: 9193711

## 2020-10-03 NOTE — Telephone Encounter (Signed)
Patient would like to know if he needs to have his lab work done prior to his apt on Tues

## 2020-10-03 NOTE — Patient Instructions (Addendum)
Medication Instructions:    Your physician recommends that you continue on your current medications as directed. Please refer to the Current Medication list given to you today.  Labwork:  Your physician recommends that you return for lab work in: February 2022 with your family doctor's other lab work to check your BMET & Mg.   Testing/Procedures:  None  Follow-Up:  Your physician recommends that you schedule a follow-up appointment in: 6 months.   Any Other Special Instructions Will Be Listed Below (If Applicable).  If you need a refill on your cardiac medications before your next appointment, please call your pharmacy.

## 2020-10-03 NOTE — Telephone Encounter (Signed)
Attempted to call pt back, LMOM TCB.  Will advise pt Joshua Oh, RN is off today, but will return to the [place] office on Monday 10/06/20 and we will have her return pt's call and let him know if he needs to have labwork done prior to his Xarelto follow-up appt in [place] office on 10/08/20.  We no longer do Xarelto/Eliquis f/u in [place] office and I am not sure if she has pt do labs before or after appt. Will defer question to her on Monday.  Pt called back, advised I would forward message to Joshua Oh, RN and have her call him on Monday 10/06/20 to advise if he needs blood work prior to upcoming appt for Xarelto f/u on Wednesday 10/08/20.

## 2020-10-03 NOTE — Progress Notes (Signed)
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  Since last being seen in our clinic, the patient reports doing very well. No symptoms of afib.  Pleased with current state.  Today, he denies symptoms of palpitations, chest pain, shortness of breath,  lower extremity edema, dizziness, presyncope, or syncope.  The patient is otherwise without complaint today.   Past Medical History:  Diagnosis Date  . Anxiety   . BPH (benign prostatic hyperplasia)   . Depression   . Erectile dysfunction   . ERYTHROCYTOSIS   . Essential hypertension, benign   . GERD (gastroesophageal reflux disease)   . Melanoma ([place]) 2003   L arm removed  . Neuropathy   . OSA (obstructive sleep apnea)    mild,  did not tolerate CPAP  . Paroxysmal atrial fibrillation (HCC)   . PONV (postoperative nausea and vomiting)   . SICK SINUS SYNDROME   . TIA 2005   pt does not think he had a TIA  . Visual loss    intermittent   Past Surgical History:  Procedure Laterality Date  . ATRIAL FIBRILLATION ABLATION N/A 03/06/2020   Procedure: ATRIAL FIBRILLATION ABLATION;  Surgeon: Thompson Grayer, MD;  Location: [place] [facility];  Service: Cardiovascular;  Laterality: N/A;  . CHOLECYSTECTOMY    . COLONOSCOPY  2013  . COLONOSCOPY WITH PROPOFOL N/A 06/13/2020   Procedure: COLONOSCOPY WITH PROPOFOL;  Surgeon: Harvel Quale, MD;  [facility];  Service: Gastroenterology;  Laterality: N/A;  . ESOPHAGEAL DILATION N/A 06/13/2020   Procedure: ESOPHAGEAL DILATION;  Surgeon: Harvel Quale, MD;  [facility];  Service: Gastroenterology;  Laterality: N/A;  . ESOPHAGOGASTRODUODENOSCOPY (EGD) WITH PROPOFOL N/A 06/13/2020   Procedure: ESOPHAGOGASTRODUODENOSCOPY (EGD) WITH PROPOFOL;  Surgeon: Harvel Quale, MD;  [facility];  Service: Gastroenterology;  Laterality: N/A;  730  . INSERT  / REPLACE / REMOVE PACEMAKER     implanted by Dr Lovena Le for sick sinus  . LYMPH NODE BIOPSY Left   . PACEMAKER INSERTION    . POLYPECTOMY  06/13/2020   Procedure: POLYPECTOMY;  Surgeon: Harvel Quale, MD;  [facility];  Service: Gastroenterology;;  ascendiing colon polyp   . PPM GENERATOR CHANGEOUT N/A 10/31/2019   Dr Lovena Le,  [facility]  . skin cancer removal    . VASECTOMY      ROS- all systems are reviewed and negatives except as per HPI above  Current Outpatient Medications  Medication Sig Dispense Refill  . alfuzosin (UROXATRAL) 10 MG 24 hr tablet Take 10 mg by mouth daily.    Marland Kitchen Apoaequorin (PREVAGEN EXTRA STRENGTH) 20 MG CAPS Take 20 mg by mouth at bedtime.    . Ca Carbonate-Mag Hydroxide (ROLAIDS PO) Take 1 tablet by mouth daily as needed (heartburn).    . Calcium Carb-Cholecalciferol (CALCIUM 600 + D PO) Take 1 tablet by mouth every 8 (eight) hours.    . dofetilide (TIKOSYN) 500 MCG capsule TAKE ONE CAPSULE BY MOUTH TWICE DAILY (Patient taking differently: Take 500 mcg by mouth 2 (two) times daily.) 180 capsule 3  . gabapentin (NEURONTIN) 300 MG capsule Take 300-600 mg by mouth See admin instructions. Take 1 capsule (300 mg) by mouth in the morning, take 1 capsule (300 mg) by mouth at noon, & take 2 capsules (600 mg) by mouth at bedtime.    . magnesium oxide (MAG-OX)  400 MG tablet Take 400 mg by mouth daily.     . metoprolol tartrate (LOPRESSOR) 25 MG tablet TAKE 1 TABLET BY MOUTH TWICE DAILY (Patient taking differently: Take 25 mg by mouth 2 (two) times daily.) 60 tablet 9  . Multiple Vitamins-Minerals (MULTIVITAMIN ADULT EXTRA C PO) Take 1 tablet by mouth daily.     Marland Kitchen omeprazole (PRILOSEC) 40 MG capsule Take 1 capsule (40 mg total) by mouth daily. 30 capsule 11  . POTASSIUM GLUCONATE PO Take 1 tablet by mouth daily.     . tadalafil (CIALIS) 5 MG tablet Take 5 mg by mouth daily.     Marland Kitchen testosterone cypionate (DEPOTESTOTERONE CYPIONATE) 200 MG/ML injection Inject 200  mg into the muscle every 14 (fourteen) days.     Alveda Reasons 20 MG TABS tablet TAKE 1 TABLET BY MOUTH DAILY with SUPPER 30 tablet 3   No current facility-administered medications for this visit.    Physical Exam: Vitals:   10/03/20 0818  BP: 120/70  Pulse: 78  Resp: 16  SpO2: 99%  Weight: 183 lb 6.4 oz (83.2 kg)  Height: 5\' 10"  (1.778 m)    GEN- The patient is well appearing, alert and oriented x 3 today.   Head- normocephalic, atraumatic Eyes-  Sclera clear, conjunctiva pink Ears- hearing intact Oropharynx- clear Lungs- Clear to ausculation bilaterally, normal work of breathing Heart- Regular rate and rhythm, no murmurs, rubs or gallops, PMI not laterally displaced GI- soft, NT, ND, + BS Extremities- no clubbing, cyanosis, or edema  Wt Readings from Last 3 Encounters:  10/03/20 183 lb 6.4 oz (83.2 kg)  06/27/20 176 lb (79.8 kg)  06/04/20 172 lb (78 kg)    PPM interrogation reviewed- see paceart  Assessment and Plan:  1. Paroxysmal atrial fibrillation Doing well s/p ablation We discussed tikosyn at length.  He wishes to continue this medicine for now.  We will need to follow him closely to avoid toxicity in the interim. He will have bmet, mg checked by Dr Maxcine Ham when he sees him in February.  I have asked that he send results to me. Continue OAC afib burden by PPM is <1%  2. SSS Normal pacemaker function See paceart  3. HTN Stable No change required today  4. Mild OSA Does not tolerate OSA  Risks, benefits and potential toxicities for medications prescribed and/or refilled reviewed with patient today.   Return in 6 months He is planning a trip to [place] in the meantime.  Thompson Grayer MD, [facility] 10/03/2020 9:00 AM

## 2020-10-06 NOTE — Telephone Encounter (Signed)
Spoke with patient.  Will see him in the office on Wednesday for Xarelto follow up and give him lab orders at that time.

## 2020-10-08 ENCOUNTER — Ambulatory Visit: Payer: Medicare PPO | Admitting: *Deleted

## 2020-10-08 DIAGNOSIS — Z5181 Encounter for therapeutic drug level monitoring: Secondary | ICD-10-CM

## 2020-10-08 DIAGNOSIS — I480 Paroxysmal atrial fibrillation: Secondary | ICD-10-CM | POA: Diagnosis not present

## 2020-10-08 NOTE — Progress Notes (Signed)
Pt was started on Xarelto for atrial fib on 01/30/20.   He denies an adverse effects since starting Xarelto.  Has ot had any bleeding, excessive bruising or GI upset.   Reviewed patients medication list.  Pt is not currently on any combined P-gp and strong CYP3A4 inhibitors/inducers (ketoconazole, traconazole, ritonavir, carbamazepine, phenytoin, rifampin, St. John's wort).  Reviewed labs 10/20/20.  SCr 1.19, Weight 83.2, CrCl 61.03 .  Dose is appropriate based on CrCl.   Hgb and HCT: 15.2/44.6  Plts 243   A full discussion of the nature of anticoagulants has been carried out.  A benefit/risk analysis has been presented to the patient, so that they understand the justification for choosing anticoagulation with Xarelto at this time.  The need for compliance is stressed.  Pt is aware to take the medication once daily with the largest meal of the day.  Side effects of potential bleeding are discussed, including unusual colored urine or stools, coughing up blood or coffee ground emesis, nose bleeds or serious fall or head trauma.  Discussed signs and symptoms of stroke. The patient should avoid any OTC items containing aspirin or ibuprofen.  Avoid alcohol consumption.   Call if any signs of abnormal bleeding.  Discussed financial obligations and resolved any difficulty in obtaining medication.  Next lab test in 6 months.   Pt will call in 6 months to discuss need for labs or if it's been done by PCP.

## 2020-10-20 DIAGNOSIS — I480 Paroxysmal atrial fibrillation: Secondary | ICD-10-CM | POA: Diagnosis not present

## 2020-11-10 DIAGNOSIS — Z0000 Encounter for general adult medical examination without abnormal findings: Secondary | ICD-10-CM | POA: Diagnosis not present

## 2020-11-10 DIAGNOSIS — I482 Chronic atrial fibrillation, unspecified: Secondary | ICD-10-CM | POA: Diagnosis not present

## 2020-11-10 DIAGNOSIS — N401 Enlarged prostate with lower urinary tract symptoms: Secondary | ICD-10-CM | POA: Diagnosis not present

## 2020-11-10 DIAGNOSIS — K2100 Gastro-esophageal reflux disease with esophagitis, without bleeding: Secondary | ICD-10-CM | POA: Diagnosis not present

## 2020-11-10 DIAGNOSIS — Z6825 Body mass index (BMI) 25.0-25.9, adult: Secondary | ICD-10-CM | POA: Diagnosis not present

## 2020-11-10 DIAGNOSIS — I10 Essential (primary) hypertension: Secondary | ICD-10-CM | POA: Diagnosis not present

## 2020-11-10 DIAGNOSIS — E291 Testicular hypofunction: Secondary | ICD-10-CM | POA: Diagnosis not present

## 2020-11-24 DIAGNOSIS — E291 Testicular hypofunction: Secondary | ICD-10-CM | POA: Diagnosis not present

## 2020-11-27 ENCOUNTER — Encounter: Payer: Self-pay | Admitting: *Deleted

## 2020-12-08 DIAGNOSIS — M85852 Other specified disorders of bone density and structure, left thigh: Secondary | ICD-10-CM | POA: Diagnosis not present

## 2020-12-08 DIAGNOSIS — M810 Age-related osteoporosis without current pathological fracture: Secondary | ICD-10-CM | POA: Diagnosis not present

## 2020-12-16 ENCOUNTER — Ambulatory Visit (INDEPENDENT_AMBULATORY_CARE_PROVIDER_SITE_OTHER): Payer: Medicare PPO

## 2020-12-16 DIAGNOSIS — I495 Sick sinus syndrome: Secondary | ICD-10-CM

## 2020-12-16 LAB — CUP PACEART REMOTE DEVICE CHECK
Battery Remaining Longevity: 115 mo
Battery Remaining Percentage: 95.5 %
Battery Voltage: 3.01 V
Brady Statistic AP VP Percent: 14 %
Brady Statistic AP VS Percent: 80 %
Brady Statistic AS VP Percent: 1 %
Brady Statistic AS VS Percent: 5.6 %
Brady Statistic RA Percent Paced: 94 %
Brady Statistic RV Percent Paced: 14 %
Date Time Interrogation Session: 20220322020015
Implantable Lead Implant Date: 20120109
Implantable Lead Implant Date: 20120109
Implantable Lead Location: 753859
Implantable Lead Location: 753860
Implantable Pulse Generator Implant Date: 20210203
Lead Channel Impedance Value: 360 Ohm
Lead Channel Impedance Value: 360 Ohm
Lead Channel Pacing Threshold Amplitude: 0.625 V
Lead Channel Pacing Threshold Amplitude: 1.375 V
Lead Channel Pacing Threshold Pulse Width: 0.4 ms
Lead Channel Pacing Threshold Pulse Width: 0.5 ms
Lead Channel Sensing Intrinsic Amplitude: 12 mV
Lead Channel Sensing Intrinsic Amplitude: 2.4 mV
Lead Channel Setting Pacing Amplitude: 1.625
Lead Channel Setting Pacing Amplitude: 1.625
Lead Channel Setting Pacing Pulse Width: 0.5 ms
Lead Channel Setting Sensing Sensitivity: 3 mV
Pulse Gen Model: 2272
Pulse Gen Serial Number: 9193711

## 2020-12-25 NOTE — Progress Notes (Signed)
Remote pacemaker transmission.   

## 2021-01-09 IMAGING — RF DG SWALLOWING FUNCTION
1 series · 1 of 1 positions shown · non-contrast
Comparison: None

CLINICAL DATA: Dysphagia. Solid-food dysphagia for 6 months,
history GERD

EXAM:
MODIFIED BARIUM SWALLOW
TECHNIQUE: Different consistencies of barium were administered orally to the
patient by the Speech Pathologist. Imaging of the pharynx was
performed in the lateral projection. The radiologist was present in
the fluoroscopy room for this study, providing personal supervision.
FLUOROSCOPY TIME:  Fluoroscopy Time:  3 minutes 18 seconds
Radiation Exposure Index (if provided by the fluoroscopic device):
27.5 mGy
Number of Acquired Spot Images: multiple fluoroscopic screen
captures

[Series 1: cp_standard · 0.26mm/px · 1 of 1 slices shown]
[im 1/1]
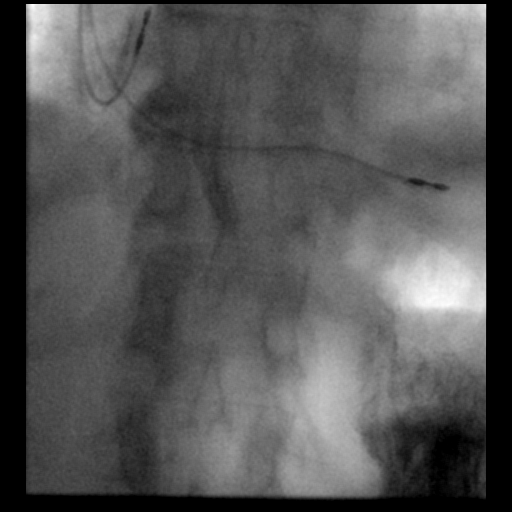

[1 of 1 positions shown; findings below may reference images not displayed]

FINDINGS: Initial swallow of thin barium demonstrates residual food
debris/residual within vallecula, eventually cleared.

Patient had vallecular residuals with applesauce and cracker
consistency.

Vallecular residuals persisted with neutral position and head turned
to the RIGHT but cleared with head turned to the LEFT.

Head turned to the RIGHT prevented vallecular residuals from
accumulating.

No laryngeal penetration or aspiration.

Failure of apical as to fully invert during exam.

First barium tablet became lodged in the RIGHT vallecula and was
difficult to clear, eventually cleared with head turned to the LEFT.

Second barium tablet did not lodged in the vallecula, swallowed
within barium and head turned to the RIGHT.
IMPRESSION: Swallowing dysfunction as above.

Please refer to the Speech Pathologists report for complete details
and recommendations.

## 2021-01-16 ENCOUNTER — Other Ambulatory Visit: Payer: Self-pay | Admitting: Internal Medicine

## 2021-01-16 NOTE — Telephone Encounter (Signed)
Prescription refill request for Xarelto received.   Indication: Afib  Last office visit: allred, 10/03/2020 Weight: 83.2 kg  Age: 80 yo  Scr: 1.19, 10/20/2020 CrCl: 61 ml/min   Pt is on the correct dose of Xarelto per dosing criteria, prescription refill sent for Xarelto 20mg  daily.

## 2021-01-26 ENCOUNTER — Other Ambulatory Visit: Payer: Self-pay | Admitting: Internal Medicine

## 2021-03-06 ENCOUNTER — Ambulatory Visit: Payer: Medicare PPO | Admitting: Internal Medicine

## 2021-03-06 ENCOUNTER — Other Ambulatory Visit: Payer: Self-pay

## 2021-03-06 ENCOUNTER — Encounter: Payer: Self-pay | Admitting: Internal Medicine

## 2021-03-06 VITALS — BP 138/72 | HR 65 | Ht 70.0 in | Wt 178.6 lb

## 2021-03-06 DIAGNOSIS — I495 Sick sinus syndrome: Secondary | ICD-10-CM | POA: Diagnosis not present

## 2021-03-06 DIAGNOSIS — I480 Paroxysmal atrial fibrillation: Secondary | ICD-10-CM

## 2021-03-06 DIAGNOSIS — I10 Essential (primary) hypertension: Secondary | ICD-10-CM

## 2021-03-06 NOTE — Patient Instructions (Signed)
Medication Instructions:  Continue all current medications.   Labwork: none  Testing/Procedures: none  Follow-Up: 6 months   Any Other Special Instructions Will Be Listed Below (If Applicable).   If you need a refill on your cardiac medications before your next appointment, please call your pharmacy.  

## 2021-03-06 NOTE — Progress Notes (Signed)
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  Since last being seen in our clinic, the patient reports doing very well.  No symptomatic afib.  Today, he denies symptoms of palpitations, chest pain, shortness of breath,  lower extremity edema, dizziness, presyncope, or syncope.  The patient is otherwise without complaint today.   Past Medical History:  Diagnosis Date   Anxiety    BPH (benign prostatic hyperplasia)    Depression    Erectile dysfunction    ERYTHROCYTOSIS    Essential hypertension, benign    GERD (gastroesophageal reflux disease)    Melanoma ([place]) 2003   L arm removed   Neuropathy    OSA (obstructive sleep apnea)    mild,  did not tolerate CPAP   Paroxysmal atrial fibrillation (HCC)    PONV (postoperative nausea and vomiting)    SICK SINUS SYNDROME    TIA 2005   pt does not think he had a TIA   Visual loss    intermittent   Past Surgical History:  Procedure Laterality Date   ATRIAL FIBRILLATION ABLATION N/A 03/06/2020   Procedure: ATRIAL FIBRILLATION ABLATION;  Surgeon: Thompson Grayer, MD;  Location: [place] [facility];  Service: Cardiovascular;  Laterality: N/A;   CHOLECYSTECTOMY     COLONOSCOPY  2013   COLONOSCOPY WITH PROPOFOL N/A 06/13/2020   Procedure: COLONOSCOPY WITH PROPOFOL;  Surgeon: Harvel Quale, MD;  [facility];  Service: Gastroenterology;  Laterality: N/A;   ESOPHAGEAL DILATION N/A 06/13/2020   Procedure: ESOPHAGEAL DILATION;  Surgeon: Harvel Quale, MD;  [facility];  Service: Gastroenterology;  Laterality: N/A;   ESOPHAGOGASTRODUODENOSCOPY (EGD) WITH PROPOFOL N/A 06/13/2020   Procedure: ESOPHAGOGASTRODUODENOSCOPY (EGD) WITH PROPOFOL;  Surgeon: Harvel Quale, MD;  [facility];  Service: Gastroenterology;  Laterality: N/A;  730   INSERT / REPLACE / REMOVE PACEMAKER     implanted by  Dr Lovena Le for sick sinus   LYMPH NODE BIOPSY Left    PACEMAKER INSERTION     POLYPECTOMY  06/13/2020   Procedure: POLYPECTOMY;  Surgeon: Harvel Quale, MD;  [facility];  Service: Gastroenterology;;  ascendiing colon polyp    PPM GENERATOR CHANGEOUT N/A 10/31/2019   Dr Lovena Le,  [facility]   skin cancer removal     VASECTOMY      ROS- all systems are reviewed and negative except as per HPI above  Current Outpatient Medications  Medication Sig Dispense Refill   alfuzosin (UROXATRAL) 10 MG 24 hr tablet Take 10 mg by mouth daily.     Apoaequorin (PREVAGEN EXTRA STRENGTH) 20 MG CAPS Take 20 mg by mouth at bedtime.     Ca Carbonate-Mag Hydroxide (ROLAIDS PO) Take 1 tablet by mouth daily as needed (heartburn).     Calcium Carb-Cholecalciferol (CALCIUM 600 + D PO) Take 1 tablet by mouth every 8 (eight) hours.     dofetilide (TIKOSYN) 500 MCG capsule TAKE ONE CAPSULE BY MOUTH TWICE DAILY 180 capsule 3   gabapentin (NEURONTIN) 300 MG capsule Take 300-600 mg by mouth See admin instructions. Take 1 capsule (300 mg) by mouth in the morning, take 1 capsule (300 mg) by mouth at noon, & take 2 capsules (600 mg) by mouth at bedtime.     magnesium oxide (MAG-OX) 400 MG tablet Take 400 mg by mouth daily.      metoprolol  tartrate (LOPRESSOR) 25 MG tablet TAKE 1 TABLET BY MOUTH TWICE DAILY 60 tablet 11   Multiple Vitamins-Minerals (MULTIVITAMIN ADULT EXTRA C PO) Take 1 tablet by mouth daily.      omeprazole (PRILOSEC) 40 MG capsule Take 1 capsule (40 mg total) by mouth daily. 30 capsule 11   POTASSIUM GLUCONATE PO Take 1 tablet by mouth daily.      tadalafil (CIALIS) 5 MG tablet Take 5 mg by mouth daily.      testosterone cypionate (DEPOTESTOTERONE CYPIONATE) 200 MG/ML injection Inject 200 mg into the muscle every 14 (fourteen) days.      XARELTO 20 MG TABS tablet TAKE 1 TABLET BY MOUTH DAILY with SUPPER 30 tablet 5   No current facility-administered medications for this visit.    Physical  Exam: Vitals:   03/06/21 0828  BP: 138/72  Pulse: 65  SpO2: 96%  Weight: 178 lb 9.6 oz (81 kg)  Height: 5\' 10"  (1.778 m)    GEN- The patient is well appearing, alert and oriented x 3 today.   Head- normocephalic, atraumatic Eyes-  Sclera clear, conjunctiva pink Ears- hearing intact Oropharynx- clear Lungs- Clear to ausculation bilaterally, normal work of breathing Chest- pacemaker pocket is well healed Heart- Regular rate and rhythm, no murmurs, rubs or gallops, PMI not laterally displaced GI- soft, NT, ND, + BS Extremities- no clubbing, cyanosis, or edema  Pacemaker interrogation- reviewed in detail today,  See PACEART report  ekg tracing ordered today is personally reviewed and shows atrial paced rhythm, first degree AV block, stable QTc  Assessment and Plan:  1. Symptomatic sinus bradycardia  See Pace Art report No changes today he is not device dependant today  2. Paroxysmal atrial fibrillation Doing well post ablation AF burden is <1 % He is clear that he wishes to continue tikosyn Dr Maxcine Ham will obtain bmet, mg next week and twice yearly  3. HTN Stable No change required today  4. Mild OSA Stable No change required today   Return in 6 months  Thompson Grayer MD, [facility] 03/06/2021 8:52 AM

## 2021-03-09 DIAGNOSIS — N401 Enlarged prostate with lower urinary tract symptoms: Secondary | ICD-10-CM | POA: Diagnosis not present

## 2021-03-09 DIAGNOSIS — I482 Chronic atrial fibrillation, unspecified: Secondary | ICD-10-CM | POA: Diagnosis not present

## 2021-03-09 DIAGNOSIS — K2100 Gastro-esophageal reflux disease with esophagitis, without bleeding: Secondary | ICD-10-CM | POA: Diagnosis not present

## 2021-03-09 DIAGNOSIS — Z6825 Body mass index (BMI) 25.0-25.9, adult: Secondary | ICD-10-CM | POA: Diagnosis not present

## 2021-03-09 DIAGNOSIS — I10 Essential (primary) hypertension: Secondary | ICD-10-CM | POA: Diagnosis not present

## 2021-03-10 DIAGNOSIS — M545 Low back pain, unspecified: Secondary | ICD-10-CM | POA: Diagnosis not present

## 2021-03-10 DIAGNOSIS — R52 Pain, unspecified: Secondary | ICD-10-CM | POA: Diagnosis not present

## 2021-03-10 DIAGNOSIS — M47814 Spondylosis without myelopathy or radiculopathy, thoracic region: Secondary | ICD-10-CM | POA: Diagnosis not present

## 2021-03-17 ENCOUNTER — Ambulatory Visit (INDEPENDENT_AMBULATORY_CARE_PROVIDER_SITE_OTHER): Payer: Medicare PPO

## 2021-03-17 DIAGNOSIS — I495 Sick sinus syndrome: Secondary | ICD-10-CM | POA: Diagnosis not present

## 2021-03-18 LAB — CUP PACEART REMOTE DEVICE CHECK
Battery Remaining Longevity: 98 mo
Battery Remaining Percentage: 88 %
Battery Voltage: 3.01 V
Brady Statistic AP VP Percent: 17 %
Brady Statistic AP VS Percent: 79 %
Brady Statistic AS VP Percent: 1 %
Brady Statistic AS VS Percent: 3.3 %
Brady Statistic RA Percent Paced: 96 %
Brady Statistic RV Percent Paced: 17 %
Date Time Interrogation Session: 20220621020014
Implantable Lead Implant Date: 20120109
Implantable Lead Implant Date: 20120109
Implantable Lead Location: 753859
Implantable Lead Location: 753860
Implantable Pulse Generator Implant Date: 20210203
Lead Channel Impedance Value: 340 Ohm
Lead Channel Impedance Value: 380 Ohm
Lead Channel Pacing Threshold Amplitude: 0.625 V
Lead Channel Pacing Threshold Amplitude: 1.375 V
Lead Channel Pacing Threshold Pulse Width: 0.4 ms
Lead Channel Pacing Threshold Pulse Width: 0.5 ms
Lead Channel Sensing Intrinsic Amplitude: 12 mV
Lead Channel Sensing Intrinsic Amplitude: 2.1 mV
Lead Channel Setting Pacing Amplitude: 1.625
Lead Channel Setting Pacing Amplitude: 1.625
Lead Channel Setting Pacing Pulse Width: 0.5 ms
Lead Channel Setting Sensing Sensitivity: 3 mV
Pulse Gen Model: 2272
Pulse Gen Serial Number: 9193711

## 2021-03-19 ENCOUNTER — Other Ambulatory Visit: Payer: Self-pay | Admitting: Internal Medicine

## 2021-03-31 ENCOUNTER — Telehealth: Payer: Self-pay | Admitting: *Deleted

## 2021-03-31 DIAGNOSIS — Z5181 Encounter for therapeutic drug level monitoring: Secondary | ICD-10-CM

## 2021-03-31 DIAGNOSIS — I480 Paroxysmal atrial fibrillation: Secondary | ICD-10-CM

## 2021-03-31 NOTE — Telephone Encounter (Signed)
Patient needs lab orders written for his Xarelto

## 2021-03-31 NOTE — Telephone Encounter (Signed)
Returned a call to the pt and he explained that he needed his routine lab orders sent to [facility] since he was told to remind [name]. Advised that [name] is in tomorrow and I will have her order and follow up with him. He was grateful for the call back and will look forward to [name] returning his call.   Pt needs routine orders placed that he gets every 6 months per pt. He is aware [name] is in tomorrow.

## 2021-04-01 NOTE — Telephone Encounter (Signed)
Spoke with pt.  He is due lab work for [place] follow up.  Entered orders.  He will pick up copy to take to [facility].  Will call pt when results are back.

## 2021-04-02 DIAGNOSIS — I480 Paroxysmal atrial fibrillation: Secondary | ICD-10-CM | POA: Diagnosis not present

## 2021-04-06 NOTE — Progress Notes (Signed)
Remote pacemaker transmission.   

## 2021-04-20 ENCOUNTER — Telehealth: Payer: Self-pay | Admitting: Internal Medicine

## 2021-04-20 DIAGNOSIS — L603 Nail dystrophy: Secondary | ICD-10-CM | POA: Diagnosis not present

## 2021-04-20 DIAGNOSIS — L720 Epidermal cyst: Secondary | ICD-10-CM | POA: Diagnosis not present

## 2021-04-20 DIAGNOSIS — L570 Actinic keratosis: Secondary | ICD-10-CM | POA: Diagnosis not present

## 2021-04-20 DIAGNOSIS — Z85820 Personal history of malignant melanoma of skin: Secondary | ICD-10-CM | POA: Diagnosis not present

## 2021-04-20 DIAGNOSIS — Z1283 Encounter for screening for malignant neoplasm of skin: Secondary | ICD-10-CM | POA: Diagnosis not present

## 2021-04-20 DIAGNOSIS — Z08 Encounter for follow-up examination after completed treatment for malignant neoplasm: Secondary | ICD-10-CM | POA: Diagnosis not present

## 2021-04-20 DIAGNOSIS — X32XXXD Exposure to sunlight, subsequent encounter: Secondary | ICD-10-CM | POA: Diagnosis not present

## 2021-04-20 NOTE — Telephone Encounter (Signed)
Called patient with Pharm D's recommendations. Patient verbalized understanding.

## 2021-04-20 NOTE — Telephone Encounter (Signed)
These antibiotics do not interact with his Xarelto or his Tikosyn, ok to take.

## 2021-04-20 NOTE — Telephone Encounter (Signed)
Will forward to PharmD for advisement. 

## 2021-04-20 NOTE — Telephone Encounter (Signed)
dermatologist would like him take an antibotic (doxycycline & keflex) but doesnt want it to intefere with Xarelto... please advise.

## 2021-04-27 DIAGNOSIS — I517 Cardiomegaly: Secondary | ICD-10-CM | POA: Diagnosis not present

## 2021-04-27 DIAGNOSIS — R9431 Abnormal electrocardiogram [ECG] [EKG]: Secondary | ICD-10-CM | POA: Diagnosis not present

## 2021-04-27 DIAGNOSIS — R110 Nausea: Secondary | ICD-10-CM | POA: Diagnosis not present

## 2021-04-27 DIAGNOSIS — R0789 Other chest pain: Secondary | ICD-10-CM | POA: Diagnosis not present

## 2021-04-27 DIAGNOSIS — Z87891 Personal history of nicotine dependence: Secondary | ICD-10-CM | POA: Diagnosis not present

## 2021-04-27 DIAGNOSIS — R079 Chest pain, unspecified: Secondary | ICD-10-CM | POA: Diagnosis not present

## 2021-04-27 DIAGNOSIS — Z885 Allergy status to narcotic agent status: Secondary | ICD-10-CM | POA: Diagnosis not present

## 2021-04-27 DIAGNOSIS — Z91041 Radiographic dye allergy status: Secondary | ICD-10-CM | POA: Diagnosis not present

## 2021-05-14 DIAGNOSIS — Z6826 Body mass index (BMI) 26.0-26.9, adult: Secondary | ICD-10-CM | POA: Diagnosis not present

## 2021-05-14 DIAGNOSIS — M542 Cervicalgia: Secondary | ICD-10-CM | POA: Diagnosis not present

## 2021-06-09 DIAGNOSIS — Z6826 Body mass index (BMI) 26.0-26.9, adult: Secondary | ICD-10-CM | POA: Diagnosis not present

## 2021-06-09 DIAGNOSIS — M5412 Radiculopathy, cervical region: Secondary | ICD-10-CM | POA: Diagnosis not present

## 2021-06-09 DIAGNOSIS — I10 Essential (primary) hypertension: Secondary | ICD-10-CM | POA: Diagnosis not present

## 2021-06-11 ENCOUNTER — Other Ambulatory Visit (HOSPITAL_COMMUNITY): Payer: Self-pay | Admitting: Student

## 2021-06-11 DIAGNOSIS — M5412 Radiculopathy, cervical region: Secondary | ICD-10-CM

## 2021-06-16 ENCOUNTER — Ambulatory Visit (INDEPENDENT_AMBULATORY_CARE_PROVIDER_SITE_OTHER): Payer: Medicare PPO

## 2021-06-16 DIAGNOSIS — I480 Paroxysmal atrial fibrillation: Secondary | ICD-10-CM | POA: Diagnosis not present

## 2021-06-16 LAB — CUP PACEART REMOTE DEVICE CHECK
Battery Remaining Longevity: 97 mo
Battery Remaining Percentage: 85 %
Battery Voltage: 3.01 V
Brady Statistic AP VP Percent: 15 %
Brady Statistic AP VS Percent: 75 %
Brady Statistic AS VP Percent: 1 %
Brady Statistic AS VS Percent: 10 %
Brady Statistic RA Percent Paced: 90 %
Brady Statistic RV Percent Paced: 15 %
Date Time Interrogation Session: 20220920020014
Implantable Lead Implant Date: 20120109
Implantable Lead Implant Date: 20120109
Implantable Lead Location: 753859
Implantable Lead Location: 753860
Implantable Pulse Generator Implant Date: 20210203
Lead Channel Impedance Value: 380 Ohm
Lead Channel Impedance Value: 400 Ohm
Lead Channel Pacing Threshold Amplitude: 0.5 V
Lead Channel Pacing Threshold Amplitude: 1.375 V
Lead Channel Pacing Threshold Pulse Width: 0.4 ms
Lead Channel Pacing Threshold Pulse Width: 0.5 ms
Lead Channel Sensing Intrinsic Amplitude: 12 mV
Lead Channel Sensing Intrinsic Amplitude: 2.9 mV
Lead Channel Setting Pacing Amplitude: 1.5 V
Lead Channel Setting Pacing Amplitude: 1.625
Lead Channel Setting Pacing Pulse Width: 0.5 ms
Lead Channel Setting Sensing Sensitivity: 3 mV
Pulse Gen Model: 2272
Pulse Gen Serial Number: 9193711

## 2021-06-23 NOTE — Progress Notes (Signed)
Remote pacemaker transmission.   

## 2021-07-06 DIAGNOSIS — I482 Chronic atrial fibrillation, unspecified: Secondary | ICD-10-CM | POA: Diagnosis not present

## 2021-07-06 DIAGNOSIS — I10 Essential (primary) hypertension: Secondary | ICD-10-CM | POA: Diagnosis not present

## 2021-07-06 DIAGNOSIS — M542 Cervicalgia: Secondary | ICD-10-CM | POA: Diagnosis not present

## 2021-07-06 DIAGNOSIS — N401 Enlarged prostate with lower urinary tract symptoms: Secondary | ICD-10-CM | POA: Diagnosis not present

## 2021-07-06 DIAGNOSIS — Z0000 Encounter for general adult medical examination without abnormal findings: Secondary | ICD-10-CM | POA: Diagnosis not present

## 2021-07-06 DIAGNOSIS — K2100 Gastro-esophageal reflux disease with esophagitis, without bleeding: Secondary | ICD-10-CM | POA: Diagnosis not present

## 2021-07-06 DIAGNOSIS — Z125 Encounter for screening for malignant neoplasm of prostate: Secondary | ICD-10-CM | POA: Diagnosis not present

## 2021-07-06 DIAGNOSIS — Z6826 Body mass index (BMI) 26.0-26.9, adult: Secondary | ICD-10-CM | POA: Diagnosis not present

## 2021-07-14 DIAGNOSIS — Z23 Encounter for immunization: Secondary | ICD-10-CM | POA: Diagnosis not present

## 2021-07-17 ENCOUNTER — Other Ambulatory Visit: Payer: Self-pay | Admitting: Internal Medicine

## 2021-07-17 NOTE — Telephone Encounter (Signed)
Prescription refill request for Xarelto received.  Indication: Afib  Last office visit: 03/06/21 (Allred) Weight: 81kg Age: 80 Scr: 1.06 (04/27/21) CrCl: 66.68ml/min  Appropriate dose and refill sent to requested pharmacy.

## 2021-07-27 ENCOUNTER — Encounter (HOSPITAL_COMMUNITY): Payer: Self-pay

## 2021-07-27 ENCOUNTER — Ambulatory Visit (HOSPITAL_COMMUNITY): Admission: RE | Admit: 2021-07-27 | Payer: Medicare PPO | Source: Ambulatory Visit

## 2021-08-23 ENCOUNTER — Other Ambulatory Visit (INDEPENDENT_AMBULATORY_CARE_PROVIDER_SITE_OTHER): Payer: Self-pay | Admitting: Gastroenterology

## 2021-08-24 NOTE — Telephone Encounter (Signed)
04/07/20 was last visit. Needs office visit

## 2021-09-04 ENCOUNTER — Encounter: Payer: Self-pay | Admitting: Internal Medicine

## 2021-09-04 ENCOUNTER — Ambulatory Visit: Payer: Medicare PPO | Admitting: Internal Medicine

## 2021-09-04 VITALS — BP 114/72 | HR 64 | Ht 70.0 in | Wt 186.0 lb

## 2021-09-04 DIAGNOSIS — D6869 Other thrombophilia: Secondary | ICD-10-CM | POA: Diagnosis not present

## 2021-09-04 DIAGNOSIS — I480 Paroxysmal atrial fibrillation: Secondary | ICD-10-CM

## 2021-09-04 DIAGNOSIS — I495 Sick sinus syndrome: Secondary | ICD-10-CM | POA: Diagnosis not present

## 2021-09-04 NOTE — Progress Notes (Signed)
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  Since last being seen in our clinic, the patient reports doing very well.  Today, he denies symptoms of palpitations, chest pain, shortness of breath,  lower extremity edema, dizziness, presyncope, or syncope.  The patient is otherwise without complaint today.   Past Medical History:  Diagnosis Date   Anxiety    BPH (benign prostatic hyperplasia)    Depression    Erectile dysfunction    ERYTHROCYTOSIS    Essential hypertension, benign    GERD (gastroesophageal reflux disease)    Melanoma ([place]) 2003   L arm removed   Neuropathy    OSA (obstructive sleep apnea)    mild,  did not tolerate CPAP   Paroxysmal atrial fibrillation (HCC)    PONV (postoperative nausea and vomiting)    SICK SINUS SYNDROME    TIA 2005   pt does not think he had a TIA   Visual loss    intermittent   Past Surgical History:  Procedure Laterality Date   ATRIAL FIBRILLATION ABLATION N/A 03/06/2020   Procedure: ATRIAL FIBRILLATION ABLATION;  Surgeon: Thompson Grayer, MD;  Location: [place] [facility];  Service: Cardiovascular;  Laterality: N/A;   CHOLECYSTECTOMY     COLONOSCOPY  2013   COLONOSCOPY WITH PROPOFOL N/A 06/13/2020   Procedure: COLONOSCOPY WITH PROPOFOL;  Surgeon: Harvel Quale, MD;  [facility];  Service: Gastroenterology;  Laterality: N/A;   ESOPHAGEAL DILATION N/A 06/13/2020   Procedure: ESOPHAGEAL DILATION;  Surgeon: Harvel Quale, MD;  [facility];  Service: Gastroenterology;  Laterality: N/A;   ESOPHAGOGASTRODUODENOSCOPY (EGD) WITH PROPOFOL N/A 06/13/2020   Procedure: ESOPHAGOGASTRODUODENOSCOPY (EGD) WITH PROPOFOL;  Surgeon: Harvel Quale, MD;  [facility];  Service: Gastroenterology;  Laterality: N/A;  730   INSERT / REPLACE / REMOVE PACEMAKER     implanted by Dr Lovena Le for sick  sinus   LYMPH NODE BIOPSY Left    PACEMAKER INSERTION     POLYPECTOMY  06/13/2020   Procedure: POLYPECTOMY;  Surgeon: Harvel Quale, MD;  [facility];  Service: Gastroenterology;;  ascendiing colon polyp    PPM GENERATOR CHANGEOUT N/A 10/31/2019   Dr Lovena Le,  S[facility]   skin cancer removal     VASECTOMY      ROS- all systems are reviewed and negative except as per HPI above  Current Outpatient Medications  Medication Sig Dispense Refill   alfuzosin (UROXATRAL) 10 MG 24 hr tablet Take 10 mg by mouth daily.     Apoaequorin (PREVAGEN EXTRA STRENGTH) 20 MG CAPS Take 20 mg by mouth at bedtime.     Ca Carbonate-Mag Hydroxide (ROLAIDS PO) Take 1 tablet by mouth daily as needed (heartburn).     Calcium Carb-Cholecalciferol (CALCIUM 600 + D PO) Take 1 tablet by mouth every 8 (eight) hours.     dofetilide (TIKOSYN) 500 MCG capsule TAKE ONE CAPSULE BY MOUTH TWICE DAILY 180 capsule 3   gabapentin (NEURONTIN) 300 MG capsule Take 300-600 mg by mouth See admin instructions. Take 1 capsule (300 mg) by mouth in the morning, take 1 capsule (300 mg) by mouth at noon, & take 2 capsules (600 mg) by mouth at bedtime.     magnesium oxide (MAG-OX) 400 MG tablet Take 400 mg by mouth daily.      metoprolol tartrate (LOPRESSOR) 25 MG  tablet TAKE 1 TABLET BY MOUTH TWICE DAILY 60 tablet 11   Multiple Vitamins-Minerals (MULTIVITAMIN ADULT EXTRA C PO) Take 1 tablet by mouth daily.      omeprazole (PRILOSEC) 40 MG capsule Take 1 capsule (40 mg total) by mouth daily. 30 capsule 11   POTASSIUM GLUCONATE PO Take 1 tablet by mouth daily.      tadalafil (CIALIS) 5 MG tablet Take 5 mg by mouth daily.      testosterone cypionate (DEPOTESTOTERONE CYPIONATE) 200 MG/ML injection Inject 200 mg into the muscle every 14 (fourteen) days.      XARELTO 20 MG TABS tablet TAKE 1 TABLET BY MOUTH DAILY with SUPPER 30 tablet 5   No current facility-administered medications for this visit.    Physical Exam: Vitals:    09/04/21 0822  BP: 114/72  Pulse: 64  SpO2: 96%  Weight: 186 lb (84.4 kg)  Height: 5\' 10"  (1.778 m)    GEN- The patient is well appearing, alert and oriented x 3 today.   Head- normocephalic, atraumatic Eyes-  Sclera clear, conjunctiva pink Ears- hearing intact Oropharynx- clear Lungs- Clear to ausculation bilaterally, normal work of breathing Chest- pacemaker pocket is well healed Heart- Regular rate and rhythm, no murmurs, rubs or gallops, PMI not laterally displaced GI- soft, NT, ND, + BS Extremities- no clubbing, cyanosis, or edema  Pacemaker interrogation- reviewed in detail today,  See PACEART report    Assessment and Plan:  1. Symptomatic sinus bradycardia  Normal pacemaker function See Pace Art report No changes today he is not device dependant today  2. Paroxysmal atrial fibrillation Doing well s/p ablation Afib burden <1% He wishes to continue tikosyn  Labs 8/22 reviewed PCP follows his labs  3. HTN Stable No change required today   Risks, benefits and potential toxicities for medications prescribed and/or refilled reviewed with patient today.   Return in 6 months  Thompson Grayer MD, [facility] 09/04/2021 8:46 AM

## 2021-09-04 NOTE — Patient Instructions (Signed)
Medication Instructions:  Continue all current medications.   Labwork: none  Testing/Procedures: none  Follow-Up: 6 months   Any Other Special Instructions Will Be Listed Below (If Applicable).   If you need a refill on your cardiac medications before your next appointment, please call your pharmacy.  

## 2021-09-15 ENCOUNTER — Ambulatory Visit (INDEPENDENT_AMBULATORY_CARE_PROVIDER_SITE_OTHER): Payer: Medicare PPO

## 2021-09-15 DIAGNOSIS — I495 Sick sinus syndrome: Secondary | ICD-10-CM | POA: Diagnosis not present

## 2021-09-16 LAB — CUP PACEART REMOTE DEVICE CHECK
Battery Remaining Longevity: 94 mo
Battery Remaining Percentage: 83 %
Battery Voltage: 3.01 V
Brady Statistic AP VP Percent: 15 %
Brady Statistic AP VS Percent: 75 %
Brady Statistic AS VP Percent: 1 %
Brady Statistic AS VS Percent: 10 %
Brady Statistic RA Percent Paced: 89 %
Brady Statistic RV Percent Paced: 15 %
Date Time Interrogation Session: 20221220195324
Implantable Lead Implant Date: 20120109
Implantable Lead Implant Date: 20120109
Implantable Lead Location: 753859
Implantable Lead Location: 753860
Implantable Pulse Generator Implant Date: 20210203
Lead Channel Impedance Value: 380 Ohm
Lead Channel Impedance Value: 390 Ohm
Lead Channel Pacing Threshold Amplitude: 0.625 V
Lead Channel Pacing Threshold Amplitude: 1.25 V
Lead Channel Pacing Threshold Pulse Width: 0.4 ms
Lead Channel Pacing Threshold Pulse Width: 0.5 ms
Lead Channel Sensing Intrinsic Amplitude: 12 mV
Lead Channel Sensing Intrinsic Amplitude: 3.1 mV
Lead Channel Setting Pacing Amplitude: 1.5 V
Lead Channel Setting Pacing Amplitude: 1.625
Lead Channel Setting Pacing Pulse Width: 0.5 ms
Lead Channel Setting Sensing Sensitivity: 3 mV
Pulse Gen Model: 2272
Pulse Gen Serial Number: 9193711

## 2021-09-17 ENCOUNTER — Ambulatory Visit (HOSPITAL_COMMUNITY): Admission: RE | Admit: 2021-09-17 | Payer: Medicare PPO | Source: Ambulatory Visit

## 2021-09-17 ENCOUNTER — Encounter (HOSPITAL_COMMUNITY): Payer: Self-pay

## 2021-09-24 NOTE — Progress Notes (Signed)
Remote pacemaker transmission.   

## 2021-10-13 DIAGNOSIS — Z6827 Body mass index (BMI) 27.0-27.9, adult: Secondary | ICD-10-CM | POA: Diagnosis not present

## 2021-10-13 DIAGNOSIS — L600 Ingrowing nail: Secondary | ICD-10-CM | POA: Diagnosis not present

## 2021-11-09 DIAGNOSIS — G629 Polyneuropathy, unspecified: Secondary | ICD-10-CM | POA: Diagnosis not present

## 2021-11-09 DIAGNOSIS — E7849 Other hyperlipidemia: Secondary | ICD-10-CM | POA: Diagnosis not present

## 2021-11-09 DIAGNOSIS — I10 Essential (primary) hypertension: Secondary | ICD-10-CM | POA: Diagnosis not present

## 2021-11-09 DIAGNOSIS — N400 Enlarged prostate without lower urinary tract symptoms: Secondary | ICD-10-CM | POA: Diagnosis not present

## 2021-11-09 DIAGNOSIS — K219 Gastro-esophageal reflux disease without esophagitis: Secondary | ICD-10-CM | POA: Diagnosis not present

## 2021-11-09 DIAGNOSIS — Z6826 Body mass index (BMI) 26.0-26.9, adult: Secondary | ICD-10-CM | POA: Diagnosis not present

## 2021-11-09 DIAGNOSIS — I4891 Unspecified atrial fibrillation: Secondary | ICD-10-CM | POA: Diagnosis not present

## 2021-11-11 DIAGNOSIS — L570 Actinic keratosis: Secondary | ICD-10-CM | POA: Diagnosis not present

## 2021-11-11 DIAGNOSIS — X32XXXD Exposure to sunlight, subsequent encounter: Secondary | ICD-10-CM | POA: Diagnosis not present

## 2021-11-11 DIAGNOSIS — Z85820 Personal history of malignant melanoma of skin: Secondary | ICD-10-CM | POA: Diagnosis not present

## 2021-11-11 DIAGNOSIS — Z1283 Encounter for screening for malignant neoplasm of skin: Secondary | ICD-10-CM | POA: Diagnosis not present

## 2021-11-11 DIAGNOSIS — D225 Melanocytic nevi of trunk: Secondary | ICD-10-CM | POA: Diagnosis not present

## 2021-11-11 DIAGNOSIS — Z08 Encounter for follow-up examination after completed treatment for malignant neoplasm: Secondary | ICD-10-CM | POA: Diagnosis not present

## 2021-11-12 DIAGNOSIS — H524 Presbyopia: Secondary | ICD-10-CM | POA: Diagnosis not present

## 2021-11-12 DIAGNOSIS — H43391 Other vitreous opacities, right eye: Secondary | ICD-10-CM | POA: Diagnosis not present

## 2021-12-03 ENCOUNTER — Ambulatory Visit (INDEPENDENT_AMBULATORY_CARE_PROVIDER_SITE_OTHER): Payer: Medicare PPO | Admitting: Gastroenterology

## 2021-12-17 ENCOUNTER — Ambulatory Visit (INDEPENDENT_AMBULATORY_CARE_PROVIDER_SITE_OTHER): Payer: Medicare PPO | Admitting: Gastroenterology

## 2021-12-21 ENCOUNTER — Ambulatory Visit (INDEPENDENT_AMBULATORY_CARE_PROVIDER_SITE_OTHER): Payer: Medicare PPO

## 2021-12-21 DIAGNOSIS — I495 Sick sinus syndrome: Secondary | ICD-10-CM | POA: Diagnosis not present

## 2021-12-22 LAB — CUP PACEART REMOTE DEVICE CHECK
Battery Remaining Longevity: 91 mo
Battery Remaining Percentage: 80 %
Battery Voltage: 3.01 V
Brady Statistic AP VP Percent: 17 %
Brady Statistic AP VS Percent: 74 %
Brady Statistic AS VP Percent: 1 %
Brady Statistic AS VS Percent: 8.9 %
Brady Statistic RA Percent Paced: 91 %
Brady Statistic RV Percent Paced: 17 %
Date Time Interrogation Session: 20230326200043
Implantable Lead Implant Date: 20120109
Implantable Lead Implant Date: 20120109
Implantable Lead Location: 753859
Implantable Lead Location: 753860
Implantable Pulse Generator Implant Date: 20210203
Lead Channel Impedance Value: 380 Ohm
Lead Channel Impedance Value: 390 Ohm
Lead Channel Pacing Threshold Amplitude: 0.625 V
Lead Channel Pacing Threshold Amplitude: 1.25 V
Lead Channel Pacing Threshold Pulse Width: 0.4 ms
Lead Channel Pacing Threshold Pulse Width: 0.5 ms
Lead Channel Sensing Intrinsic Amplitude: 12 mV
Lead Channel Sensing Intrinsic Amplitude: 2.9 mV
Lead Channel Setting Pacing Amplitude: 1.5 V
Lead Channel Setting Pacing Amplitude: 1.625
Lead Channel Setting Pacing Pulse Width: 0.5 ms
Lead Channel Setting Sensing Sensitivity: 3 mV
Pulse Gen Model: 2272
Pulse Gen Serial Number: 9193711

## 2021-12-30 NOTE — Progress Notes (Signed)
Remote pacemaker transmission.   

## 2022-01-13 ENCOUNTER — Other Ambulatory Visit: Payer: Self-pay | Admitting: Internal Medicine

## 2022-01-13 NOTE — Telephone Encounter (Signed)
Prescription refill request for Xarelto received.  ?Indication: PAF ?Last office visit: 09/04/21  Clearnce Hasten MD ?Weight: 84.4kg ?Age: 81 ?Scr: 1.06 on 04/27/21 ?CrCl: 68.56 ? ?Based on above findings Xarelto '20mg'$  daily is the appropriate dose.  Refill approved. ? ?

## 2022-02-26 ENCOUNTER — Encounter: Payer: Medicare PPO | Admitting: Internal Medicine

## 2022-02-26 DIAGNOSIS — R001 Bradycardia, unspecified: Secondary | ICD-10-CM | POA: Insufficient documentation

## 2022-02-26 DIAGNOSIS — I10 Essential (primary) hypertension: Secondary | ICD-10-CM | POA: Insufficient documentation

## 2022-03-05 ENCOUNTER — Encounter: Payer: Self-pay | Admitting: Internal Medicine

## 2022-03-05 ENCOUNTER — Ambulatory Visit (INDEPENDENT_AMBULATORY_CARE_PROVIDER_SITE_OTHER): Payer: Medicare PPO | Admitting: Internal Medicine

## 2022-03-05 VITALS — BP 133/79 | HR 60 | Ht 70.0 in | Wt 184.0 lb

## 2022-03-05 DIAGNOSIS — I480 Paroxysmal atrial fibrillation: Secondary | ICD-10-CM | POA: Diagnosis not present

## 2022-03-05 DIAGNOSIS — R001 Bradycardia, unspecified: Secondary | ICD-10-CM | POA: Diagnosis not present

## 2022-03-05 DIAGNOSIS — I10 Essential (primary) hypertension: Secondary | ICD-10-CM | POA: Diagnosis not present

## 2022-03-05 DIAGNOSIS — I495 Sick sinus syndrome: Secondary | ICD-10-CM

## 2022-03-05 LAB — CUP PACEART INCLINIC DEVICE CHECK
Battery Remaining Longevity: 88 mo
Battery Voltage: 2.99 V
Brady Statistic RA Percent Paced: 90 %
Brady Statistic RV Percent Paced: 17 %
Date Time Interrogation Session: 20230609152420
Implantable Lead Implant Date: 20120109
Implantable Lead Implant Date: 20120109
Implantable Lead Location: 753859
Implantable Lead Location: 753860
Implantable Pulse Generator Implant Date: 20210203
Lead Channel Impedance Value: 375 Ohm
Lead Channel Impedance Value: 375 Ohm
Lead Channel Pacing Threshold Amplitude: 0.5 V
Lead Channel Pacing Threshold Amplitude: 0.5 V
Lead Channel Pacing Threshold Amplitude: 1.5 V
Lead Channel Pacing Threshold Amplitude: 1.5 V
Lead Channel Pacing Threshold Pulse Width: 0.4 ms
Lead Channel Pacing Threshold Pulse Width: 0.4 ms
Lead Channel Pacing Threshold Pulse Width: 0.5 ms
Lead Channel Pacing Threshold Pulse Width: 0.5 ms
Lead Channel Sensing Intrinsic Amplitude: 12 mV
Lead Channel Sensing Intrinsic Amplitude: 2.1 mV
Lead Channel Setting Pacing Amplitude: 1.625
Lead Channel Setting Pacing Amplitude: 1.625
Lead Channel Setting Pacing Pulse Width: 0.5 ms
Lead Channel Setting Sensing Sensitivity: 3 mV
Pulse Gen Model: 2272
Pulse Gen Serial Number: 9193711

## 2022-03-05 NOTE — Patient Instructions (Signed)
Medication Instructions:  Your physician recommends that you continue on your current medications as directed. Please refer to the Current Medication list given to you today.  *If you need a refill on your cardiac medications before your next appointment, please call your pharmacy*   Lab Work: None ordered.  If you have labs (blood work) drawn today and your tests are completely normal, you will receive your results only by: [place] (if you have MyChart) OR A paper copy in the mail If you have any lab test that is abnormal or we need to change your treatment, we will call you to review the results.   Testing/Procedures: None ordered.    Follow-Up: At [facility], you and your health needs are our priority.  As part of our continuing mission to provide you with exceptional heart care, we have created designated Provider Care Teams.  These Care Teams include your primary Cardiologist (physician) and Advanced Practice Providers (APPs -  Physician Assistants and Nurse Practitioners) who all work together to provide you with the care you need, when you need it.  We recommend signing up for the patient portal called "MyChart".  Sign up information is provided on this After Visit Summary.  MyChart is used to connect with patients for Virtual Visits (Telemedicine).  Patients are able to view lab/test results, encounter notes, upcoming appointments, etc.  Non-urgent messages can be sent to your provider as well.   To learn more about what you can do with MyChart, go to [email or web address].    Your next appointment:   6 months with Dr Rayann Heman  Important Information About Sugar

## 2022-03-05 NOTE — Progress Notes (Signed)
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  Since last being seen in our clinic, the patient reports doing very well.  Today, he denies symptoms of palpitations, chest pain, shortness of breath,  lower extremity edema, dizziness, presyncope, or syncope.  The patient is otherwise without complaint today.   Past Medical History:  Diagnosis Date   Anxiety    BPH (benign prostatic hyperplasia)    Depression    Erectile dysfunction    ERYTHROCYTOSIS    Essential hypertension, benign    GERD (gastroesophageal reflux disease)    Melanoma ([place]) 2003   L arm removed   Neuropathy    OSA (obstructive sleep apnea)    mild,  did not tolerate CPAP   Paroxysmal atrial fibrillation (HCC)    PONV (postoperative nausea and vomiting)    SICK SINUS SYNDROME    TIA 2005   pt does not think he had a TIA   Visual loss    intermittent   Past Surgical History:  Procedure Laterality Date   ATRIAL FIBRILLATION ABLATION N/A 03/06/2020   Procedure: ATRIAL FIBRILLATION ABLATION;  Surgeon: Thompson Grayer, MD;  Location: [facility];  Service: Cardiovascular;  Laterality: N/A;   CHOLECYSTECTOMY     COLONOSCOPY  2013   COLONOSCOPY WITH PROPOFOL N/A 06/13/2020   Procedure: COLONOSCOPY WITH PROPOFOL;  Surgeon: Harvel Quale, MD;  [facility];  Service: Gastroenterology;  Laterality: N/A;   ESOPHAGEAL DILATION N/A 06/13/2020   Procedure: ESOPHAGEAL DILATION;  Surgeon: Harvel Quale, MD;  [facility];  Service: Gastroenterology;  Laterality: N/A;   ESOPHAGOGASTRODUODENOSCOPY (EGD) WITH PROPOFOL N/A 06/13/2020   Procedure: ESOPHAGOGASTRODUODENOSCOPY (EGD) WITH PROPOFOL;  Surgeon: Harvel Quale, MD;  [facility];  Service: Gastroenterology;  Laterality: N/A;  730   INSERT / REPLACE / REMOVE PACEMAKER     implanted by Dr Lovena Le for sick  sinus   LYMPH NODE BIOPSY Left    PACEMAKER INSERTION     POLYPECTOMY  06/13/2020   Procedure: POLYPECTOMY;  Surgeon: Harvel Quale, MD;  [facility];  Service: Gastroenterology;;  ascendiing colon polyp    PPM GENERATOR CHANGEOUT N/A 10/31/2019   Dr Lovena Le,  [facility]   skin cancer removal     VASECTOMY      ROS- all systems are reviewed and negative except as per HPI above  Current Outpatient Medications  Medication Sig Dispense Refill   alfuzosin (UROXATRAL) 10 MG 24 hr tablet Take 10 mg by mouth daily.     Apoaequorin (PREVAGEN EXTRA STRENGTH) 20 MG CAPS Take 20 mg by mouth at bedtime.     Ca Carbonate-Mag Hydroxide (ROLAIDS PO) Take 1 tablet by mouth daily as needed (heartburn).     Calcium Carb-Cholecalciferol (CALCIUM 600 + D PO) Take 1 tablet by mouth every 8 (eight) hours.     dofetilide (TIKOSYN) 500 MCG capsule TAKE ONE CAPSULE BY MOUTH TWICE DAILY 180 capsule 3   gabapentin (NEURONTIN) 300 MG capsule Take 300-600 mg by mouth See admin instructions. Take 1 capsule (300 mg) by mouth in the morning, take 1 capsule (300 mg) by mouth at noon, & take 2 capsules (600 mg) by mouth at bedtime.     magnesium oxide (MAG-OX) 400 MG tablet Take 400 mg by mouth daily.      metoprolol tartrate (LOPRESSOR) 25 MG  tablet TAKE 1 TABLET BY MOUTH TWICE DAILY 60 tablet 11   Multiple Vitamins-Minerals (MULTIVITAMIN ADULT EXTRA C PO) Take 1 tablet by mouth daily.      omeprazole (PRILOSEC) 40 MG capsule Take 1 capsule (40 mg total) by mouth daily. 30 capsule 11   POTASSIUM GLUCONATE PO Take 1 tablet by mouth daily.      tadalafil (CIALIS) 5 MG tablet Take 5 mg by mouth daily.      testosterone cypionate (DEPOTESTOTERONE CYPIONATE) 200 MG/ML injection Inject 200 mg into the muscle every 14 (fourteen) days.      XARELTO 20 MG TABS tablet TAKE 1 TABLET BY MOUTH DAILY with SUPPER 30 tablet 5   No current facility-administered medications for this visit.    Physical Exam: Vitals:    03/05/22 0820  BP: 133/79  Pulse: 60  SpO2: 97%  Weight: 184 lb (83.5 kg)  Height: '5\' 10"'$  (1.778 m)    GEN- The patient is well appearing, alert and oriented x 3 today.   Head- normocephalic, atraumatic Eyes-  Sclera clear, conjunctiva pink Ears- hearing intact Oropharynx- clear Lungs- Clear to ausculation bilaterally, normal work of breathing Chest- pacemaker pocket is well healed Heart- Regular rate and rhythm, no murmurs, rubs or gallops, PMI not laterally displaced GI- soft, NT, ND, + BS Extremities- no clubbing, cyanosis, or edema  Pacemaker interrogation- reviewed in detail today,  See PACEART report  ekg tracing ordered today is personally reviewed and shows atrial paced rhythm, Qtc 390 msec  Assessment and Plan:  1. Symptomatic sinus bradycardia  Normal pacemaker function See Pace Art report No changes today he is not device dependant today  2. Paroxysmal atrial fibrillation Burden by PPM is <1% he wishes to continue tikosyn PCP follows his labs.  I have stressed importance of following K, mg and creat to patient at length today (every 6 months).  He is clear that he wishes to have his PCP do this.  3. HTN Stable No change required today   Risks, benefits and potential toxicities for medications prescribed and/or refilled reviewed with patient today.   Return in 6 months  Thompson Grayer MD, [facility] 03/05/2022 8:45 AM

## 2022-03-08 DIAGNOSIS — Z0000 Encounter for general adult medical examination without abnormal findings: Secondary | ICD-10-CM | POA: Diagnosis not present

## 2022-03-08 DIAGNOSIS — Z6826 Body mass index (BMI) 26.0-26.9, adult: Secondary | ICD-10-CM | POA: Diagnosis not present

## 2022-03-08 DIAGNOSIS — E7849 Other hyperlipidemia: Secondary | ICD-10-CM | POA: Diagnosis not present

## 2022-03-08 DIAGNOSIS — Z125 Encounter for screening for malignant neoplasm of prostate: Secondary | ICD-10-CM | POA: Diagnosis not present

## 2022-03-08 DIAGNOSIS — N400 Enlarged prostate without lower urinary tract symptoms: Secondary | ICD-10-CM | POA: Diagnosis not present

## 2022-03-08 DIAGNOSIS — K219 Gastro-esophageal reflux disease without esophagitis: Secondary | ICD-10-CM | POA: Diagnosis not present

## 2022-03-08 DIAGNOSIS — G629 Polyneuropathy, unspecified: Secondary | ICD-10-CM | POA: Diagnosis not present

## 2022-03-08 DIAGNOSIS — I4891 Unspecified atrial fibrillation: Secondary | ICD-10-CM | POA: Diagnosis not present

## 2022-03-08 DIAGNOSIS — I10 Essential (primary) hypertension: Secondary | ICD-10-CM | POA: Diagnosis not present

## 2022-03-15 ENCOUNTER — Other Ambulatory Visit: Payer: Self-pay | Admitting: Internal Medicine

## 2022-03-22 ENCOUNTER — Other Ambulatory Visit: Payer: Self-pay | Admitting: Internal Medicine

## 2022-03-22 ENCOUNTER — Ambulatory Visit (INDEPENDENT_AMBULATORY_CARE_PROVIDER_SITE_OTHER): Payer: Medicare PPO

## 2022-03-22 DIAGNOSIS — R001 Bradycardia, unspecified: Secondary | ICD-10-CM | POA: Diagnosis not present

## 2022-03-25 LAB — CUP PACEART REMOTE DEVICE CHECK
Battery Remaining Longevity: 86 mo
Battery Remaining Percentage: 77 %
Battery Voltage: 2.99 V
Brady Statistic AP VP Percent: 25 %
Brady Statistic AP VS Percent: 68 %
Brady Statistic AS VP Percent: 1 %
Brady Statistic AS VS Percent: 7 %
Brady Statistic RA Percent Paced: 93 %
Brady Statistic RV Percent Paced: 25 %
Date Time Interrogation Session: 20230628150736
Implantable Lead Implant Date: 20120109
Implantable Lead Implant Date: 20120109
Implantable Lead Location: 753859
Implantable Lead Location: 753860
Implantable Pulse Generator Implant Date: 20210203
Lead Channel Impedance Value: 380 Ohm
Lead Channel Impedance Value: 400 Ohm
Lead Channel Pacing Threshold Amplitude: 0.625 V
Lead Channel Pacing Threshold Amplitude: 1.25 V
Lead Channel Pacing Threshold Pulse Width: 0.4 ms
Lead Channel Pacing Threshold Pulse Width: 0.5 ms
Lead Channel Sensing Intrinsic Amplitude: 12 mV
Lead Channel Sensing Intrinsic Amplitude: 2.5 mV
Lead Channel Setting Pacing Amplitude: 1.5 V
Lead Channel Setting Pacing Amplitude: 1.625
Lead Channel Setting Pacing Pulse Width: 0.5 ms
Lead Channel Setting Sensing Sensitivity: 3 mV
Pulse Gen Model: 2272
Pulse Gen Serial Number: 9193711

## 2022-04-15 DIAGNOSIS — L821 Other seborrheic keratosis: Secondary | ICD-10-CM | POA: Diagnosis not present

## 2022-04-15 NOTE — Progress Notes (Signed)
Remote pacemaker transmission.   

## 2022-06-07 DIAGNOSIS — I10 Essential (primary) hypertension: Secondary | ICD-10-CM | POA: Diagnosis not present

## 2022-06-07 DIAGNOSIS — I4891 Unspecified atrial fibrillation: Secondary | ICD-10-CM | POA: Diagnosis not present

## 2022-06-07 DIAGNOSIS — K219 Gastro-esophageal reflux disease without esophagitis: Secondary | ICD-10-CM | POA: Diagnosis not present

## 2022-06-07 DIAGNOSIS — Z6826 Body mass index (BMI) 26.0-26.9, adult: Secondary | ICD-10-CM | POA: Diagnosis not present

## 2022-06-07 DIAGNOSIS — N400 Enlarged prostate without lower urinary tract symptoms: Secondary | ICD-10-CM | POA: Diagnosis not present

## 2022-06-07 DIAGNOSIS — Z0000 Encounter for general adult medical examination without abnormal findings: Secondary | ICD-10-CM | POA: Diagnosis not present

## 2022-06-07 DIAGNOSIS — G629 Polyneuropathy, unspecified: Secondary | ICD-10-CM | POA: Diagnosis not present

## 2022-06-07 DIAGNOSIS — E7849 Other hyperlipidemia: Secondary | ICD-10-CM | POA: Diagnosis not present

## 2022-07-01 ENCOUNTER — Other Ambulatory Visit: Payer: Self-pay | Admitting: Internal Medicine

## 2022-07-01 DIAGNOSIS — I480 Paroxysmal atrial fibrillation: Secondary | ICD-10-CM

## 2022-07-01 NOTE — Telephone Encounter (Signed)
Xarelto '20mg'$  refill request received. Pt is 81 years old, weight-83.5kg, Crea-1.25 on 03/09/2022 via [employer], last seen by Dr. Rayann Heman on 03/05/2022 and has a follow up with Dr. Myles Gip on 09/03/2022, Diagnosis-Afib, CrCl- 57.52 mL/min; Dose is appropriate based on dosing criteria. Will send in refill to requested pharmacy.

## 2022-07-02 ENCOUNTER — Ambulatory Visit (INDEPENDENT_AMBULATORY_CARE_PROVIDER_SITE_OTHER): Payer: Medicare PPO

## 2022-07-02 DIAGNOSIS — I495 Sick sinus syndrome: Secondary | ICD-10-CM

## 2022-07-02 LAB — CUP PACEART REMOTE DEVICE CHECK
Battery Remaining Longevity: 83 mo
Battery Remaining Percentage: 74 %
Battery Voltage: 3.01 V
Brady Statistic AP VP Percent: 23 %
Brady Statistic AP VS Percent: 66 %
Brady Statistic AS VP Percent: 1 %
Brady Statistic AS VS Percent: 11 %
Brady Statistic RA Percent Paced: 88 %
Brady Statistic RV Percent Paced: 23 %
Date Time Interrogation Session: 20231005225335
Implantable Lead Implant Date: 20120109
Implantable Lead Implant Date: 20120109
Implantable Lead Location: 753859
Implantable Lead Location: 753860
Implantable Pulse Generator Implant Date: 20210203
Lead Channel Impedance Value: 360 Ohm
Lead Channel Impedance Value: 390 Ohm
Lead Channel Pacing Threshold Amplitude: 0.625 V
Lead Channel Pacing Threshold Amplitude: 1.375 V
Lead Channel Pacing Threshold Pulse Width: 0.4 ms
Lead Channel Pacing Threshold Pulse Width: 0.5 ms
Lead Channel Sensing Intrinsic Amplitude: 12 mV
Lead Channel Sensing Intrinsic Amplitude: 2.5 mV
Lead Channel Setting Pacing Amplitude: 1.625
Lead Channel Setting Pacing Amplitude: 1.625
Lead Channel Setting Pacing Pulse Width: 0.5 ms
Lead Channel Setting Sensing Sensitivity: 3 mV
Pulse Gen Model: 2272
Pulse Gen Serial Number: 9193711

## 2022-07-05 DIAGNOSIS — H35361 Drusen (degenerative) of macula, right eye: Secondary | ICD-10-CM | POA: Diagnosis not present

## 2022-07-05 DIAGNOSIS — H35033 Hypertensive retinopathy, bilateral: Secondary | ICD-10-CM | POA: Diagnosis not present

## 2022-07-05 DIAGNOSIS — H353121 Nonexudative age-related macular degeneration, left eye, early dry stage: Secondary | ICD-10-CM | POA: Diagnosis not present

## 2022-07-05 DIAGNOSIS — H2513 Age-related nuclear cataract, bilateral: Secondary | ICD-10-CM | POA: Diagnosis not present

## 2022-07-05 DIAGNOSIS — H25013 Cortical age-related cataract, bilateral: Secondary | ICD-10-CM | POA: Diagnosis not present

## 2022-07-05 DIAGNOSIS — H524 Presbyopia: Secondary | ICD-10-CM | POA: Diagnosis not present

## 2022-07-05 DIAGNOSIS — H2512 Age-related nuclear cataract, left eye: Secondary | ICD-10-CM | POA: Diagnosis not present

## 2022-07-05 DIAGNOSIS — H25043 Posterior subcapsular polar age-related cataract, bilateral: Secondary | ICD-10-CM | POA: Diagnosis not present

## 2022-07-06 DIAGNOSIS — I517 Cardiomegaly: Secondary | ICD-10-CM | POA: Diagnosis not present

## 2022-07-06 DIAGNOSIS — Z950 Presence of cardiac pacemaker: Secondary | ICD-10-CM | POA: Diagnosis not present

## 2022-07-06 DIAGNOSIS — R0789 Other chest pain: Secondary | ICD-10-CM | POA: Diagnosis not present

## 2022-07-06 DIAGNOSIS — R0782 Intercostal pain: Secondary | ICD-10-CM | POA: Diagnosis not present

## 2022-07-06 DIAGNOSIS — J849 Interstitial pulmonary disease, unspecified: Secondary | ICD-10-CM | POA: Diagnosis not present

## 2022-07-08 NOTE — Progress Notes (Signed)
Remote pacemaker transmission.   

## 2022-07-20 DIAGNOSIS — H268 Other specified cataract: Secondary | ICD-10-CM | POA: Diagnosis not present

## 2022-07-20 DIAGNOSIS — H2512 Age-related nuclear cataract, left eye: Secondary | ICD-10-CM | POA: Diagnosis not present

## 2022-07-20 DIAGNOSIS — H5703 Miosis: Secondary | ICD-10-CM | POA: Diagnosis not present

## 2022-08-23 ENCOUNTER — Other Ambulatory Visit: Payer: Self-pay | Admitting: Internal Medicine

## 2022-08-27 ENCOUNTER — Encounter: Payer: Medicare PPO | Admitting: Internal Medicine

## 2022-08-30 ENCOUNTER — Other Ambulatory Visit: Payer: Self-pay | Admitting: Internal Medicine

## 2022-09-01 DIAGNOSIS — H2511 Age-related nuclear cataract, right eye: Secondary | ICD-10-CM | POA: Diagnosis not present

## 2022-09-01 DIAGNOSIS — H25041 Posterior subcapsular polar age-related cataract, right eye: Secondary | ICD-10-CM | POA: Diagnosis not present

## 2022-09-01 DIAGNOSIS — H25011 Cortical age-related cataract, right eye: Secondary | ICD-10-CM | POA: Diagnosis not present

## 2022-09-02 ENCOUNTER — Other Ambulatory Visit: Payer: Self-pay

## 2022-09-03 ENCOUNTER — Encounter: Payer: Self-pay | Admitting: Cardiovascular Disease

## 2022-09-03 ENCOUNTER — Ambulatory Visit: Payer: Medicare PPO | Attending: Internal Medicine | Admitting: Cardiovascular Disease

## 2022-09-03 VITALS — BP 138/78 | HR 68 | Ht 70.0 in | Wt 195.0 lb

## 2022-09-03 DIAGNOSIS — Z79899 Other long term (current) drug therapy: Secondary | ICD-10-CM

## 2022-09-03 DIAGNOSIS — I10 Essential (primary) hypertension: Secondary | ICD-10-CM

## 2022-09-03 DIAGNOSIS — I480 Paroxysmal atrial fibrillation: Secondary | ICD-10-CM

## 2022-09-03 NOTE — Patient Instructions (Addendum)
Medication Instructions:  Continue all current medications.  Labwork: BMET, Mg - orders given today Office will contact with results via phone, letter or mychart.     Testing/Procedures: none  Follow-Up: 6 months   Any Other Special Instructions Will Be Listed Below (If Applicable).   If you need a refill on your cardiac medications before your next appointment, please call your pharmacy.

## 2022-09-03 NOTE — Progress Notes (Signed)
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  Since last being seen in our clinic, the patient reports doing very well.  Today, he denies symptoms of palpitations, chest pain, shortness of breath,  lower extremity edema, dizziness, presyncope, or syncope.  The patient is otherwise without complaint today.   Past Medical History:  Diagnosis Date   Anxiety    BPH (benign prostatic hyperplasia)    Depression    Erectile dysfunction    ERYTHROCYTOSIS    Essential hypertension, benign    GERD (gastroesophageal reflux disease)    Melanoma ([place]) 2003   L arm removed   Neuropathy    OSA (obstructive sleep apnea)    mild,  did not tolerate CPAP   Paroxysmal atrial fibrillation (HCC)    PONV (postoperative nausea and vomiting)    SICK SINUS SYNDROME    TIA 2005   pt does not think he had a TIA   Visual loss    intermittent   Past Surgical History:  Procedure Laterality Date   ATRIAL FIBRILLATION ABLATION N/A 03/06/2020   Procedure: ATRIAL FIBRILLATION ABLATION;  Surgeon: Thompson Grayer, MD;  Location: [facility];  Service: Cardiovascular;  Laterality: N/A;   CHOLECYSTECTOMY     COLONOSCOPY  2013   COLONOSCOPY WITH PROPOFOL N/A 06/13/2020   Procedure: COLONOSCOPY WITH PROPOFOL;  Surgeon: Harvel Quale, MD;  [facility];  Service: Gastroenterology;  Laterality: N/A;   ESOPHAGEAL DILATION N/A 06/13/2020   Procedure: ESOPHAGEAL DILATION;  Surgeon: Harvel Quale, MD;  [facility];  Service: Gastroenterology;  Laterality: N/A;   ESOPHAGOGASTRODUODENOSCOPY (EGD) WITH PROPOFOL N/A 06/13/2020   Procedure: ESOPHAGOGASTRODUODENOSCOPY (EGD) WITH PROPOFOL;  Surgeon: Harvel Quale, MD;  [facility];  Service: Gastroenterology;  Laterality: N/A;  730   INSERT / REPLACE / REMOVE PACEMAKER     implanted by Dr Lovena Le for sick  sinus   LYMPH NODE BIOPSY Left    PACEMAKER INSERTION     POLYPECTOMY  06/13/2020   Procedure: POLYPECTOMY;  Surgeon: Harvel Quale, MD;  [facility];  Service: Gastroenterology;;  ascendiing colon polyp    PPM GENERATOR CHANGEOUT N/A 10/31/2019   Dr Lovena Le,  [facility]   skin cancer removal     VASECTOMY      ROS- all systems are reviewed and negative except as per HPI above  Current Outpatient Medications  Medication Sig Dispense Refill   alfuzosin (UROXATRAL) 10 MG 24 hr tablet Take 10 mg by mouth daily.     Apoaequorin (PREVAGEN EXTRA STRENGTH) 20 MG CAPS Take 20 mg by mouth at bedtime.     Ca Carbonate-Mag Hydroxide (ROLAIDS PO) Take 1 tablet by mouth daily as needed (heartburn).     Calcium Carb-Cholecalciferol (CALCIUM 600 + D PO) Take 1 tablet by mouth every 8 (eight) hours.     dofetilide (TIKOSYN) 500 MCG capsule TAKE ONE CAPSULE BY MOUTH TWICE DAILY 180 capsule 1   finasteride (PROSCAR) 5 MG tablet Take 5 mg by mouth daily.     gabapentin (NEURONTIN) 300 MG capsule Take 300-600 mg by mouth See admin instructions. Take 1 capsule (300 mg) by mouth in the morning, take 1 capsule (300 mg) by mouth at noon, & take 2 capsules (600 mg) by mouth at bedtime.     magnesium oxide (MAG-OX) 400 MG tablet Take  400 mg by mouth daily.      metoprolol tartrate (LOPRESSOR) 25 MG tablet TAKE 1 TABLET BY MOUTH TWICE DAILY 180 tablet 1   Multiple Vitamins-Minerals (MULTIVITAMIN ADULT EXTRA C PO) Take 1 tablet by mouth daily.      omeprazole (PRILOSEC) 40 MG capsule Take 1 capsule (40 mg total) by mouth daily. 30 capsule 11   POTASSIUM GLUCONATE PO Take 1 tablet by mouth daily.      tadalafil (CIALIS) 5 MG tablet Take 5 mg by mouth daily.      testosterone cypionate (DEPOTESTOTERONE CYPIONATE) 200 MG/ML injection Inject 200 mg into the muscle every 14 (fourteen) days.      XARELTO 20 MG TABS tablet TAKE 1 TABLET BY MOUTH DAILY with SUPPER 30 tablet 5   No current  facility-administered medications for this visit.    Physical Exam: Vitals:   09/03/22 1558  BP: 138/78  Pulse: 68  SpO2: 98%  Weight: 195 lb (88.5 kg)  Height: '5\' 10"'$  (1.778 m)    GEN- The patient is well appearing, alert and oriented x 3 today.   Lungs- Clear to ausculation bilaterally, normal work of breathing Chest- pacemaker pocket is well healed Heart- Regular rate and rhythm, no murmurs, rubs or gallops Extremities- no clubbing, cyanosis, or edema  Pacemaker interrogation- reviewed in detail today,  See PACEART report   Assessment and Plan:  1. Symptomatic sinus bradycardia  Normal pacemaker function See Pace Art report No changes today he is not device dependant today  2. Paroxysmal atrial fibrillation Burden by PPM is 1% he wishes to continue tikosyn Will check Bmet, Mg  3. HTN Stable No change required today   Risks, benefits and potential toxicities for medications prescribed and/or refilled reviewed with patient today.   Return in 6 months  Thompson Grayer MD, [facility] 09/03/2022 4:42 PM

## 2022-09-06 DIAGNOSIS — Z79899 Other long term (current) drug therapy: Secondary | ICD-10-CM | POA: Diagnosis not present

## 2022-09-07 DIAGNOSIS — H268 Other specified cataract: Secondary | ICD-10-CM | POA: Diagnosis not present

## 2022-09-07 DIAGNOSIS — H25011 Cortical age-related cataract, right eye: Secondary | ICD-10-CM | POA: Diagnosis not present

## 2022-09-07 DIAGNOSIS — H21561 Pupillary abnormality, right eye: Secondary | ICD-10-CM | POA: Diagnosis not present

## 2022-09-07 DIAGNOSIS — H2511 Age-related nuclear cataract, right eye: Secondary | ICD-10-CM | POA: Diagnosis not present

## 2022-09-07 DIAGNOSIS — H5703 Miosis: Secondary | ICD-10-CM | POA: Diagnosis not present

## 2022-09-07 DIAGNOSIS — H25041 Posterior subcapsular polar age-related cataract, right eye: Secondary | ICD-10-CM | POA: Diagnosis not present

## 2022-09-08 ENCOUNTER — Other Ambulatory Visit: Payer: Self-pay | Admitting: *Deleted

## 2022-09-08 MED ORDER — DOFETILIDE 500 MCG PO CAPS: 500.0000 ug | ORAL_CAPSULE | Freq: Two times a day (BID) | ORAL | 2 refills | Status: DC

## 2022-09-21 ENCOUNTER — Ambulatory Visit (INDEPENDENT_AMBULATORY_CARE_PROVIDER_SITE_OTHER): Payer: Medicare PPO

## 2022-09-21 DIAGNOSIS — I495 Sick sinus syndrome: Secondary | ICD-10-CM | POA: Diagnosis not present

## 2022-09-22 LAB — CUP PACEART REMOTE DEVICE CHECK
Battery Remaining Longevity: 82 mo
Battery Remaining Percentage: 72 %
Battery Voltage: 2.99 V
Brady Statistic AP VP Percent: 21 %
Brady Statistic AP VS Percent: 68 %
Brady Statistic AS VP Percent: 1 %
Brady Statistic AS VS Percent: 11 %
Brady Statistic RA Percent Paced: 89 %
Brady Statistic RV Percent Paced: 21 %
Date Time Interrogation Session: 20231226020012
Implantable Lead Connection Status: 753985
Implantable Lead Connection Status: 753985
Implantable Lead Implant Date: 20120109
Implantable Lead Implant Date: 20120109
Implantable Lead Location: 753859
Implantable Lead Location: 753860
Implantable Pulse Generator Implant Date: 20210203
Lead Channel Impedance Value: 360 Ohm
Lead Channel Impedance Value: 380 Ohm
Lead Channel Pacing Threshold Amplitude: 0.625 V
Lead Channel Pacing Threshold Amplitude: 1.25 V
Lead Channel Pacing Threshold Pulse Width: 0.4 ms
Lead Channel Pacing Threshold Pulse Width: 0.5 ms
Lead Channel Sensing Intrinsic Amplitude: 1.7 mV
Lead Channel Sensing Intrinsic Amplitude: 12 mV
Lead Channel Setting Pacing Amplitude: 1.5 V
Lead Channel Setting Pacing Amplitude: 1.625
Lead Channel Setting Pacing Pulse Width: 0.5 ms
Lead Channel Setting Sensing Sensitivity: 3 mV
Pulse Gen Model: 2272
Pulse Gen Serial Number: 9193711

## 2022-10-04 DIAGNOSIS — E7849 Other hyperlipidemia: Secondary | ICD-10-CM | POA: Diagnosis not present

## 2022-10-04 DIAGNOSIS — K219 Gastro-esophageal reflux disease without esophagitis: Secondary | ICD-10-CM | POA: Diagnosis not present

## 2022-10-04 DIAGNOSIS — Z0000 Encounter for general adult medical examination without abnormal findings: Secondary | ICD-10-CM | POA: Diagnosis not present

## 2022-10-04 DIAGNOSIS — I10 Essential (primary) hypertension: Secondary | ICD-10-CM | POA: Diagnosis not present

## 2022-10-04 DIAGNOSIS — I4891 Unspecified atrial fibrillation: Secondary | ICD-10-CM | POA: Diagnosis not present

## 2022-10-04 DIAGNOSIS — G629 Polyneuropathy, unspecified: Secondary | ICD-10-CM | POA: Diagnosis not present

## 2022-10-04 DIAGNOSIS — N400 Enlarged prostate without lower urinary tract symptoms: Secondary | ICD-10-CM | POA: Diagnosis not present

## 2022-10-04 DIAGNOSIS — Z6827 Body mass index (BMI) 27.0-27.9, adult: Secondary | ICD-10-CM | POA: Diagnosis not present

## 2022-10-14 NOTE — Progress Notes (Signed)
Remote pacemaker transmission.   

## 2022-12-23 ENCOUNTER — Other Ambulatory Visit: Payer: Self-pay | Admitting: *Deleted

## 2022-12-23 DIAGNOSIS — I480 Paroxysmal atrial fibrillation: Secondary | ICD-10-CM

## 2022-12-23 MED ORDER — RIVAROXABAN 20 MG PO TABS: 20.0000 mg | ORAL_TABLET | Freq: Every day | ORAL | 5 refills | Status: DC

## 2022-12-23 NOTE — Telephone Encounter (Signed)
Prescription refill request for Xarelto received.  Indication: PAF Last office visit: 09/03/22  A Mealor MD Weight: 88.5kg Age: 82 Scr: 1.19 on 09/06/22  Epic CrCl: 63.01  Based on above findings Xarelto 20mg  daily is the appropriate dose.  Refill approved.

## 2022-12-31 ENCOUNTER — Ambulatory Visit (INDEPENDENT_AMBULATORY_CARE_PROVIDER_SITE_OTHER): Payer: Medicare PPO

## 2022-12-31 DIAGNOSIS — I495 Sick sinus syndrome: Secondary | ICD-10-CM | POA: Diagnosis not present

## 2023-01-02 LAB — CUP PACEART REMOTE DEVICE CHECK
Battery Remaining Longevity: 78 mo
Battery Remaining Percentage: 69 %
Battery Voltage: 2.99 V
Brady Statistic AP VP Percent: 23 %
Brady Statistic AP VS Percent: 65 %
Brady Statistic AS VP Percent: 1 %
Brady Statistic AS VS Percent: 12 %
Brady Statistic RA Percent Paced: 88 %
Brady Statistic RV Percent Paced: 23 %
Date Time Interrogation Session: 20240405020032
Implantable Lead Connection Status: 753985
Implantable Lead Connection Status: 753985
Implantable Lead Implant Date: 20120109
Implantable Lead Implant Date: 20120109
Implantable Lead Location: 753859
Implantable Lead Location: 753860
Implantable Pulse Generator Implant Date: 20210203
Lead Channel Impedance Value: 360 Ohm
Lead Channel Impedance Value: 390 Ohm
Lead Channel Pacing Threshold Amplitude: 0.5 V
Lead Channel Pacing Threshold Amplitude: 1.5 V
Lead Channel Pacing Threshold Pulse Width: 0.4 ms
Lead Channel Pacing Threshold Pulse Width: 0.5 ms
Lead Channel Sensing Intrinsic Amplitude: 1.9 mV
Lead Channel Sensing Intrinsic Amplitude: 12 mV
Lead Channel Setting Pacing Amplitude: 1.5 V
Lead Channel Setting Pacing Amplitude: 1.75 V
Lead Channel Setting Pacing Pulse Width: 0.5 ms
Lead Channel Setting Sensing Sensitivity: 3 mV
Pulse Gen Model: 2272
Pulse Gen Serial Number: 9193711

## 2023-01-17 DIAGNOSIS — Z08 Encounter for follow-up examination after completed treatment for malignant neoplasm: Secondary | ICD-10-CM | POA: Diagnosis not present

## 2023-01-17 DIAGNOSIS — Z1283 Encounter for screening for malignant neoplasm of skin: Secondary | ICD-10-CM | POA: Diagnosis not present

## 2023-01-17 DIAGNOSIS — Z85820 Personal history of malignant melanoma of skin: Secondary | ICD-10-CM | POA: Diagnosis not present

## 2023-01-17 DIAGNOSIS — C44311 Basal cell carcinoma of skin of nose: Secondary | ICD-10-CM | POA: Diagnosis not present

## 2023-01-17 DIAGNOSIS — C44319 Basal cell carcinoma of skin of other parts of face: Secondary | ICD-10-CM | POA: Diagnosis not present

## 2023-01-17 DIAGNOSIS — D225 Melanocytic nevi of trunk: Secondary | ICD-10-CM | POA: Diagnosis not present

## 2023-01-31 DIAGNOSIS — G629 Polyneuropathy, unspecified: Secondary | ICD-10-CM | POA: Diagnosis not present

## 2023-01-31 DIAGNOSIS — N400 Enlarged prostate without lower urinary tract symptoms: Secondary | ICD-10-CM | POA: Diagnosis not present

## 2023-01-31 DIAGNOSIS — Z0000 Encounter for general adult medical examination without abnormal findings: Secondary | ICD-10-CM | POA: Diagnosis not present

## 2023-01-31 DIAGNOSIS — I4891 Unspecified atrial fibrillation: Secondary | ICD-10-CM | POA: Diagnosis not present

## 2023-01-31 DIAGNOSIS — Z6826 Body mass index (BMI) 26.0-26.9, adult: Secondary | ICD-10-CM | POA: Diagnosis not present

## 2023-01-31 DIAGNOSIS — E7849 Other hyperlipidemia: Secondary | ICD-10-CM | POA: Diagnosis not present

## 2023-01-31 DIAGNOSIS — K219 Gastro-esophageal reflux disease without esophagitis: Secondary | ICD-10-CM | POA: Diagnosis not present

## 2023-01-31 DIAGNOSIS — I10 Essential (primary) hypertension: Secondary | ICD-10-CM | POA: Diagnosis not present

## 2023-02-02 NOTE — Progress Notes (Signed)
Remote pacemaker transmission.   

## 2023-02-23 ENCOUNTER — Other Ambulatory Visit: Payer: Self-pay | Admitting: Cardiovascular Disease

## 2023-02-28 DIAGNOSIS — J206 Acute bronchitis due to rhinovirus: Secondary | ICD-10-CM | POA: Diagnosis not present

## 2023-02-28 DIAGNOSIS — Z6825 Body mass index (BMI) 25.0-25.9, adult: Secondary | ICD-10-CM | POA: Diagnosis not present

## 2023-02-28 DIAGNOSIS — Z08 Encounter for follow-up examination after completed treatment for malignant neoplasm: Secondary | ICD-10-CM | POA: Diagnosis not present

## 2023-02-28 DIAGNOSIS — Z85828 Personal history of other malignant neoplasm of skin: Secondary | ICD-10-CM | POA: Diagnosis not present

## 2023-02-28 DIAGNOSIS — I10 Essential (primary) hypertension: Secondary | ICD-10-CM | POA: Diagnosis not present

## 2023-03-01 DIAGNOSIS — R0602 Shortness of breath: Secondary | ICD-10-CM | POA: Diagnosis not present

## 2023-03-01 DIAGNOSIS — J8410 Pulmonary fibrosis, unspecified: Secondary | ICD-10-CM | POA: Diagnosis not present

## 2023-03-30 DIAGNOSIS — J432 Centrilobular emphysema: Secondary | ICD-10-CM | POA: Diagnosis not present

## 2023-03-30 DIAGNOSIS — R059 Cough, unspecified: Secondary | ICD-10-CM | POA: Diagnosis not present

## 2023-03-30 DIAGNOSIS — R918 Other nonspecific abnormal finding of lung field: Secondary | ICD-10-CM | POA: Diagnosis not present

## 2023-04-01 ENCOUNTER — Encounter: Payer: Self-pay | Admitting: Cardiovascular Disease

## 2023-04-01 ENCOUNTER — Ambulatory Visit: Payer: Medicare PPO | Attending: Cardiovascular Disease | Admitting: Cardiovascular Disease

## 2023-04-01 ENCOUNTER — Ambulatory Visit (INDEPENDENT_AMBULATORY_CARE_PROVIDER_SITE_OTHER): Payer: Medicare PPO

## 2023-04-01 VITALS — BP 132/68 | HR 95 | Ht 70.0 in | Wt 181.4 lb

## 2023-04-01 DIAGNOSIS — I480 Paroxysmal atrial fibrillation: Secondary | ICD-10-CM

## 2023-04-01 DIAGNOSIS — Z79899 Other long term (current) drug therapy: Secondary | ICD-10-CM | POA: Diagnosis not present

## 2023-04-01 DIAGNOSIS — I495 Sick sinus syndrome: Secondary | ICD-10-CM

## 2023-04-01 DIAGNOSIS — I4891 Unspecified atrial fibrillation: Secondary | ICD-10-CM | POA: Diagnosis not present

## 2023-04-01 DIAGNOSIS — Z7901 Long term (current) use of anticoagulants: Secondary | ICD-10-CM

## 2023-04-01 LAB — CUP PACEART REMOTE DEVICE CHECK
Battery Remaining Longevity: 76 mo
Battery Remaining Percentage: 66 %
Battery Voltage: 2.99 V
Brady Statistic AP VP Percent: 21 %
Brady Statistic AP VS Percent: 60 %
Brady Statistic AS VP Percent: 1 %
Brady Statistic AS VS Percent: 19 %
Brady Statistic RA Percent Paced: 80 %
Brady Statistic RV Percent Paced: 21 %
Date Time Interrogation Session: 20240705020024
Implantable Lead Connection Status: 753985
Implantable Lead Connection Status: 753985
Implantable Lead Implant Date: 20120109
Implantable Lead Implant Date: 20120109
Implantable Lead Location: 753859
Implantable Lead Location: 753860
Implantable Pulse Generator Implant Date: 20210203
Lead Channel Impedance Value: 380 Ohm
Lead Channel Impedance Value: 390 Ohm
Lead Channel Pacing Threshold Amplitude: 0.625 V
Lead Channel Pacing Threshold Amplitude: 1.25 V
Lead Channel Pacing Threshold Pulse Width: 0.4 ms
Lead Channel Pacing Threshold Pulse Width: 0.5 ms
Lead Channel Sensing Intrinsic Amplitude: 12 mV
Lead Channel Sensing Intrinsic Amplitude: 4 mV
Lead Channel Setting Pacing Amplitude: 1.5 V
Lead Channel Setting Pacing Amplitude: 1.625
Lead Channel Setting Pacing Pulse Width: 0.5 ms
Lead Channel Setting Sensing Sensitivity: 3 mV
Pulse Gen Model: 2272
Pulse Gen Serial Number: 9193711

## 2023-04-01 NOTE — Patient Instructions (Signed)
Medication Instructions:   Continue all current medications.   Labwork:  BMET, CBC, Mg - orders given Office will contact with results via phone, letter or mychart.     Testing/Procedures:  none  Follow-Up:  6 months   Any Other Special Instructions Will Be Listed Below (If Applicable).   If you need a refill on your cardiac medications before your next appointment, please call your pharmacy.

## 2023-04-01 NOTE — Progress Notes (Signed)
    PCP: Joshua Deiters, MD   Primary EP:  Joshua [name] is a 82 y.o. male who presents today for routine electrophysiology followup.      He has a history of atrial fibrillation, currently managed with Tikosyn.  He had an A-fib ablation by Joshua. Joshua [name] in June 2021.  His Saint Jude pacemaker was initially placed by Joshua. Joshua [name], and the generator was changed in 2021.      Since his last visit, he took a 2 week trip to [place] where he was subjected to a lot of dust and air pollution and since developed pulmonary issues with a nonproductive cough.  He is now under the care of a pulmonologist.  Today, he denies symptoms of palpitations, chest pain, lower extremity edema, dizziness, presyncope, or syncope.  The patient is otherwise without complaint today.   he has no device related complaints -- no new tenderness, drainage, redness.    Physical Exam: Vitals:   04/01/23 0843  BP: 132/68  Pulse: 95  SpO2: 98%  Weight: 181 lb 6.4 oz (82.3 kg)  Height: 5\' 10"  (1.778 m)     GEN- The patient is well appearing, alert and oriented x 3 today.   Lungs- Clear to ausculation bilaterally, normal work of breathing Chest- pacemaker pocket is well healed Heart- Regular rate and rhythm, no murmurs, rubs or gallops Extremities- no clubbing, cyanosis, or edema  Pacemaker interrogation- reviewed in detail today,  See PACEART report   Assessment and Plan:  1. Symptomatic sinus bradycardia  Normal pacemaker function See Pace Art report No changes today he is not device dependant today  2. Paroxysmal atrial fibrillation Burden by PPM is <1% S/p ablation by Joshua. Joshua [name] in June 2021 he wishes to continue tikosyn Will check Bmet, Mg  3.  Secondary hypercoagulable state Continue Xarelto 20 Check CBC  4. HTN Stable No change required today   Risks, benefits and potential toxicities for medications prescribed and/or refilled reviewed with patient today.   Return in 6  months  Joshua Small, MD 04/01/2023 9:02 AM

## 2023-04-11 DIAGNOSIS — H353121 Nonexudative age-related macular degeneration, left eye, early dry stage: Secondary | ICD-10-CM | POA: Diagnosis not present

## 2023-04-15 NOTE — Progress Notes (Signed)
Remote pacemaker transmission.   

## 2023-05-17 DIAGNOSIS — I10 Essential (primary) hypertension: Secondary | ICD-10-CM | POA: Diagnosis not present

## 2023-05-17 DIAGNOSIS — L720 Epidermal cyst: Secondary | ICD-10-CM | POA: Diagnosis not present

## 2023-05-17 DIAGNOSIS — Z6826 Body mass index (BMI) 26.0-26.9, adult: Secondary | ICD-10-CM | POA: Diagnosis not present

## 2023-05-20 DIAGNOSIS — R918 Other nonspecific abnormal finding of lung field: Secondary | ICD-10-CM | POA: Diagnosis not present

## 2023-05-20 DIAGNOSIS — J479 Bronchiectasis, uncomplicated: Secondary | ICD-10-CM | POA: Diagnosis not present

## 2023-05-20 DIAGNOSIS — R059 Cough, unspecified: Secondary | ICD-10-CM | POA: Diagnosis not present

## 2023-05-23 ENCOUNTER — Other Ambulatory Visit: Payer: Self-pay | Admitting: *Deleted

## 2023-05-23 MED ORDER — DOFETILIDE 500 MCG PO CAPS: 500.0000 ug | ORAL_CAPSULE | Freq: Two times a day (BID) | ORAL | 3 refills | Status: DC

## 2023-05-27 DIAGNOSIS — R059 Cough, unspecified: Secondary | ICD-10-CM | POA: Diagnosis not present

## 2023-05-27 DIAGNOSIS — R918 Other nonspecific abnormal finding of lung field: Secondary | ICD-10-CM | POA: Diagnosis not present

## 2023-06-03 DIAGNOSIS — J849 Interstitial pulmonary disease, unspecified: Secondary | ICD-10-CM | POA: Diagnosis not present

## 2023-06-06 DIAGNOSIS — I4891 Unspecified atrial fibrillation: Secondary | ICD-10-CM | POA: Diagnosis not present

## 2023-06-06 DIAGNOSIS — K219 Gastro-esophageal reflux disease without esophagitis: Secondary | ICD-10-CM | POA: Diagnosis not present

## 2023-06-06 DIAGNOSIS — I10 Essential (primary) hypertension: Secondary | ICD-10-CM | POA: Diagnosis not present

## 2023-06-06 DIAGNOSIS — E7849 Other hyperlipidemia: Secondary | ICD-10-CM | POA: Diagnosis not present

## 2023-06-06 DIAGNOSIS — N400 Enlarged prostate without lower urinary tract symptoms: Secondary | ICD-10-CM | POA: Diagnosis not present

## 2023-06-06 DIAGNOSIS — Z6827 Body mass index (BMI) 27.0-27.9, adult: Secondary | ICD-10-CM | POA: Diagnosis not present

## 2023-06-06 DIAGNOSIS — G629 Polyneuropathy, unspecified: Secondary | ICD-10-CM | POA: Diagnosis not present

## 2023-06-17 DIAGNOSIS — R768 Other specified abnormal immunological findings in serum: Secondary | ICD-10-CM | POA: Diagnosis not present

## 2023-06-17 DIAGNOSIS — J849 Interstitial pulmonary disease, unspecified: Secondary | ICD-10-CM | POA: Diagnosis not present

## 2023-06-21 ENCOUNTER — Other Ambulatory Visit: Payer: Self-pay | Admitting: Cardiovascular Disease

## 2023-06-21 DIAGNOSIS — R918 Other nonspecific abnormal finding of lung field: Secondary | ICD-10-CM | POA: Diagnosis not present

## 2023-06-21 DIAGNOSIS — R110 Nausea: Secondary | ICD-10-CM | POA: Diagnosis not present

## 2023-06-21 DIAGNOSIS — I10 Essential (primary) hypertension: Secondary | ICD-10-CM | POA: Diagnosis not present

## 2023-06-21 DIAGNOSIS — R079 Chest pain, unspecified: Secondary | ICD-10-CM | POA: Diagnosis not present

## 2023-06-21 DIAGNOSIS — R0789 Other chest pain: Secondary | ICD-10-CM | POA: Diagnosis not present

## 2023-06-21 DIAGNOSIS — Z87891 Personal history of nicotine dependence: Secondary | ICD-10-CM | POA: Diagnosis not present

## 2023-06-21 DIAGNOSIS — Z20822 Contact with and (suspected) exposure to covid-19: Secondary | ICD-10-CM | POA: Diagnosis not present

## 2023-06-21 DIAGNOSIS — I480 Paroxysmal atrial fibrillation: Secondary | ICD-10-CM

## 2023-06-21 DIAGNOSIS — E871 Hypo-osmolality and hyponatremia: Secondary | ICD-10-CM | POA: Diagnosis not present

## 2023-06-21 DIAGNOSIS — Z950 Presence of cardiac pacemaker: Secondary | ICD-10-CM | POA: Diagnosis not present

## 2023-06-21 DIAGNOSIS — M79602 Pain in left arm: Secondary | ICD-10-CM | POA: Diagnosis not present

## 2023-06-21 DIAGNOSIS — R9431 Abnormal electrocardiogram [ECG] [EKG]: Secondary | ICD-10-CM | POA: Diagnosis not present

## 2023-06-21 DIAGNOSIS — I517 Cardiomegaly: Secondary | ICD-10-CM | POA: Diagnosis not present

## 2023-06-21 NOTE — Telephone Encounter (Signed)
Xarelto 20mg  refill request received. Pt is years old, weight- kg, Crea-1.09 on 04/01/23 via [facility] from [facility], last seen by Dr. Nelly Laurence on 04/01/23, Diagnosis-Afib, CrCl-63.97 mL/min; Dose is appropriate based on dosing criteria. Will send in refill to requested pharmacy.

## 2023-07-01 ENCOUNTER — Ambulatory Visit (INDEPENDENT_AMBULATORY_CARE_PROVIDER_SITE_OTHER): Payer: Medicare PPO

## 2023-07-01 DIAGNOSIS — I495 Sick sinus syndrome: Secondary | ICD-10-CM

## 2023-07-02 LAB — CUP PACEART REMOTE DEVICE CHECK
Battery Remaining Longevity: 72 mo
Battery Remaining Percentage: 64 %
Battery Voltage: 2.99 V
Brady Statistic AP VP Percent: 23 %
Brady Statistic AP VS Percent: 58 %
Brady Statistic AS VP Percent: 1 %
Brady Statistic AS VS Percent: 19 %
Brady Statistic RA Percent Paced: 81 %
Brady Statistic RV Percent Paced: 23 %
Date Time Interrogation Session: 20241004020017
Implantable Lead Connection Status: 753985
Implantable Lead Connection Status: 753985
Implantable Lead Implant Date: 20120109
Implantable Lead Implant Date: 20120109
Implantable Lead Location: 753859
Implantable Lead Location: 753860
Implantable Pulse Generator Implant Date: 20210203
Lead Channel Impedance Value: 340 Ohm
Lead Channel Impedance Value: 380 Ohm
Lead Channel Pacing Threshold Amplitude: 0.5 V
Lead Channel Pacing Threshold Amplitude: 1.375 V
Lead Channel Pacing Threshold Pulse Width: 0.4 ms
Lead Channel Pacing Threshold Pulse Width: 0.5 ms
Lead Channel Sensing Intrinsic Amplitude: 12 mV
Lead Channel Sensing Intrinsic Amplitude: 2.6 mV
Lead Channel Setting Pacing Amplitude: 1.5 V
Lead Channel Setting Pacing Amplitude: 1.625
Lead Channel Setting Pacing Pulse Width: 0.5 ms
Lead Channel Setting Sensing Sensitivity: 3 mV
Pulse Gen Model: 2272
Pulse Gen Serial Number: 9193711

## 2023-07-13 NOTE — Progress Notes (Signed)
Remote pacemaker transmission.   

## 2023-07-22 DIAGNOSIS — Z23 Encounter for immunization: Secondary | ICD-10-CM | POA: Diagnosis not present

## 2023-07-28 DIAGNOSIS — J84112 Idiopathic pulmonary fibrosis: Secondary | ICD-10-CM | POA: Diagnosis not present

## 2023-07-28 DIAGNOSIS — R768 Other specified abnormal immunological findings in serum: Secondary | ICD-10-CM | POA: Diagnosis not present

## 2023-07-28 DIAGNOSIS — M150 Primary generalized (osteo)arthritis: Secondary | ICD-10-CM | POA: Diagnosis not present

## 2023-08-17 DIAGNOSIS — J849 Interstitial pulmonary disease, unspecified: Secondary | ICD-10-CM | POA: Diagnosis not present

## 2023-08-17 DIAGNOSIS — I340 Nonrheumatic mitral (valve) insufficiency: Secondary | ICD-10-CM | POA: Diagnosis not present

## 2023-08-17 DIAGNOSIS — I77810 Thoracic aortic ectasia: Secondary | ICD-10-CM | POA: Diagnosis not present

## 2023-08-17 DIAGNOSIS — Z959 Presence of cardiac and vascular implant and graft, unspecified: Secondary | ICD-10-CM | POA: Diagnosis not present

## 2023-08-17 DIAGNOSIS — I517 Cardiomegaly: Secondary | ICD-10-CM | POA: Diagnosis not present

## 2023-09-02 ENCOUNTER — Ambulatory Visit: Payer: Medicare PPO | Attending: Cardiovascular Disease | Admitting: Cardiovascular Disease

## 2023-09-02 ENCOUNTER — Encounter: Payer: Self-pay | Admitting: Cardiovascular Disease

## 2023-09-02 VITALS — BP 128/72 | HR 74 | Ht 70.0 in | Wt 191.4 lb

## 2023-09-02 DIAGNOSIS — I480 Paroxysmal atrial fibrillation: Secondary | ICD-10-CM

## 2023-09-02 NOTE — Progress Notes (Signed)
    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      He has a history of atrial fibrillation, currently managed with Tikosyn.  He had an A-fib ablation by Dr. [name] in June 2021.  His Saint Jude pacemaker was initially placed by Dr. [name], and the generator was changed in 2021.       Today, he denies symptoms of palpitations, chest pain, lower extremity edema, dizziness, presyncope, or syncope.  The patient is otherwise without complaint today.   he has no device related complaints -- no new tenderness, drainage, redness.    Physical Exam: Vitals:   09/02/23 0941  BP: 128/72  Pulse: 74  SpO2: 94%  Weight: 191 lb 6.4 oz (86.8 kg)  Height: 5\' 10"  (1.778 m)      GEN- The patient is well appearing, alert and oriented x 3 today.   Lungs- Clear to ausculation bilaterally, normal work of breathing Chest- pacemaker pocket is well healed Heart- Regular rate and rhythm, no murmurs, rubs or gallops Extremities- no clubbing, cyanosis, or edema  Pacemaker interrogation- reviewed in detail today,  See PACEART report  EKG Interpretation Date/Time:  Friday September 02 2023 09:39:55 EST Ventricular Rate:  68 PR Interval:  286 QRS Duration:  84 QT Interval:  372 QTC Calculation: 395 R Axis:   32  Text Interpretation: Atrial-paced rhythm with prolonged AV conduction When compared with ECG of 01-Apr-2023 08:47, Borderline criteria for Lateral infarct are no longer Present Confirmed by York Pellant [phone]) on 09/02/2023 10:08:00 AM    Assessment and Plan:  Symptomatic sinus bradycardia  Normal pacemaker function See Arita Miss Art report No changes today he is not device dependant today  Paroxysmal atrial fibrillation Burden by PPM is <1% S/p ablation by Dr. [name] in June 2021 he wishes to continue tikosyn Bmet, mag 06/23/23 reviewed  Secondary hypercoagulable state Continue  Xarelto 20 CBC  06/23/23 reviewed  HTN Stable No change required today   Risks, benefits and potential toxicities for medications prescribed and/or refilled reviewed with patient today.   Return in 6 months  Maurice Small, MD 09/02/2023 10:07 AM

## 2023-09-02 NOTE — Patient Instructions (Signed)
Medication Instructions:  Continue all current medications.   Labwork: none  Testing/Procedures: none  Follow-Up: 6 months   Any Other Special Instructions Will Be Listed Below (If Applicable).   If you need a refill on your cardiac medications before your next appointment, please call your pharmacy.  

## 2023-09-07 LAB — CUP PACEART INCLINIC DEVICE CHECK
Date Time Interrogation Session: 20241206144536
Implantable Lead Connection Status: 753985
Implantable Lead Connection Status: 753985
Implantable Lead Implant Date: 20120109
Implantable Lead Implant Date: 20120109
Implantable Lead Location: 753859
Implantable Lead Location: 753860
Implantable Pulse Generator Implant Date: 20210203
Pulse Gen Model: 2272
Pulse Gen Serial Number: 9193711

## 2023-09-30 ENCOUNTER — Ambulatory Visit (INDEPENDENT_AMBULATORY_CARE_PROVIDER_SITE_OTHER): Payer: Medicare PPO

## 2023-09-30 DIAGNOSIS — I495 Sick sinus syndrome: Secondary | ICD-10-CM | POA: Diagnosis not present

## 2023-09-30 LAB — CUP PACEART REMOTE DEVICE CHECK
Battery Remaining Longevity: 70 mo
Battery Remaining Percentage: 61 %
Battery Voltage: 2.99 V
Brady Statistic AP VP Percent: 23 %
Brady Statistic AP VS Percent: 64 %
Brady Statistic AS VP Percent: 1 %
Brady Statistic AS VS Percent: 12 %
Brady Statistic RA Percent Paced: 87 %
Brady Statistic RV Percent Paced: 23 %
Date Time Interrogation Session: 20250103020015
Implantable Lead Connection Status: 753985
Implantable Lead Connection Status: 753985
Implantable Lead Implant Date: 20120109
Implantable Lead Implant Date: 20120109
Implantable Lead Location: 753859
Implantable Lead Location: 753860
Implantable Pulse Generator Implant Date: 20210203
Lead Channel Impedance Value: 360 Ohm
Lead Channel Impedance Value: 380 Ohm
Lead Channel Pacing Threshold Amplitude: 0.625 V
Lead Channel Pacing Threshold Amplitude: 1.125 V
Lead Channel Pacing Threshold Pulse Width: 0.4 ms
Lead Channel Pacing Threshold Pulse Width: 0.5 ms
Lead Channel Sensing Intrinsic Amplitude: 12 mV
Lead Channel Sensing Intrinsic Amplitude: 2.4 mV
Lead Channel Setting Pacing Amplitude: 1.375
Lead Channel Setting Pacing Amplitude: 1.625
Lead Channel Setting Pacing Pulse Width: 0.5 ms
Lead Channel Setting Sensing Sensitivity: 3 mV
Pulse Gen Model: 2272
Pulse Gen Serial Number: 9193711

## 2023-10-10 DIAGNOSIS — F17290 Nicotine dependence, other tobacco product, uncomplicated: Secondary | ICD-10-CM | POA: Diagnosis not present

## 2023-10-13 DIAGNOSIS — J849 Interstitial pulmonary disease, unspecified: Secondary | ICD-10-CM | POA: Diagnosis not present

## 2023-10-13 DIAGNOSIS — F17290 Nicotine dependence, other tobacco product, uncomplicated: Secondary | ICD-10-CM | POA: Diagnosis not present

## 2023-10-17 DIAGNOSIS — I10 Essential (primary) hypertension: Secondary | ICD-10-CM | POA: Diagnosis not present

## 2023-10-17 DIAGNOSIS — Z0000 Encounter for general adult medical examination without abnormal findings: Secondary | ICD-10-CM | POA: Diagnosis not present

## 2023-10-17 DIAGNOSIS — Z6827 Body mass index (BMI) 27.0-27.9, adult: Secondary | ICD-10-CM | POA: Diagnosis not present

## 2023-10-17 DIAGNOSIS — N400 Enlarged prostate without lower urinary tract symptoms: Secondary | ICD-10-CM | POA: Diagnosis not present

## 2023-10-17 DIAGNOSIS — K219 Gastro-esophageal reflux disease without esophagitis: Secondary | ICD-10-CM | POA: Diagnosis not present

## 2023-10-17 DIAGNOSIS — E7849 Other hyperlipidemia: Secondary | ICD-10-CM | POA: Diagnosis not present

## 2023-10-17 DIAGNOSIS — I480 Paroxysmal atrial fibrillation: Secondary | ICD-10-CM | POA: Diagnosis not present

## 2023-10-17 DIAGNOSIS — G629 Polyneuropathy, unspecified: Secondary | ICD-10-CM | POA: Diagnosis not present

## 2023-10-18 DIAGNOSIS — Z125 Encounter for screening for malignant neoplasm of prostate: Secondary | ICD-10-CM | POA: Diagnosis not present

## 2023-10-18 DIAGNOSIS — N400 Enlarged prostate without lower urinary tract symptoms: Secondary | ICD-10-CM | POA: Diagnosis not present

## 2023-10-18 DIAGNOSIS — G629 Polyneuropathy, unspecified: Secondary | ICD-10-CM | POA: Diagnosis not present

## 2023-10-18 DIAGNOSIS — Z0000 Encounter for general adult medical examination without abnormal findings: Secondary | ICD-10-CM | POA: Diagnosis not present

## 2023-10-18 DIAGNOSIS — I4891 Unspecified atrial fibrillation: Secondary | ICD-10-CM | POA: Diagnosis not present

## 2023-10-18 DIAGNOSIS — E7849 Other hyperlipidemia: Secondary | ICD-10-CM | POA: Diagnosis not present

## 2023-10-18 DIAGNOSIS — I10 Essential (primary) hypertension: Secondary | ICD-10-CM | POA: Diagnosis not present

## 2023-11-04 NOTE — Progress Notes (Signed)
 Remote pacemaker transmission.

## 2023-11-29 DIAGNOSIS — M810 Age-related osteoporosis without current pathological fracture: Secondary | ICD-10-CM | POA: Diagnosis not present

## 2023-11-29 DIAGNOSIS — Z13820 Encounter for screening for osteoporosis: Secondary | ICD-10-CM | POA: Diagnosis not present

## 2023-12-12 DIAGNOSIS — I480 Paroxysmal atrial fibrillation: Secondary | ICD-10-CM | POA: Diagnosis not present

## 2023-12-12 DIAGNOSIS — N400 Enlarged prostate without lower urinary tract symptoms: Secondary | ICD-10-CM | POA: Diagnosis not present

## 2023-12-12 DIAGNOSIS — G629 Polyneuropathy, unspecified: Secondary | ICD-10-CM | POA: Diagnosis not present

## 2023-12-12 DIAGNOSIS — Z6826 Body mass index (BMI) 26.0-26.9, adult: Secondary | ICD-10-CM | POA: Diagnosis not present

## 2023-12-12 DIAGNOSIS — K219 Gastro-esophageal reflux disease without esophagitis: Secondary | ICD-10-CM | POA: Diagnosis not present

## 2023-12-12 DIAGNOSIS — E7849 Other hyperlipidemia: Secondary | ICD-10-CM | POA: Diagnosis not present

## 2023-12-12 DIAGNOSIS — N182 Chronic kidney disease, stage 2 (mild): Secondary | ICD-10-CM | POA: Diagnosis not present

## 2023-12-12 DIAGNOSIS — I10 Essential (primary) hypertension: Secondary | ICD-10-CM | POA: Diagnosis not present

## 2023-12-19 ENCOUNTER — Other Ambulatory Visit: Payer: Self-pay | Admitting: Cardiovascular Disease

## 2023-12-19 DIAGNOSIS — I480 Paroxysmal atrial fibrillation: Secondary | ICD-10-CM

## 2023-12-19 NOTE — Telephone Encounter (Signed)
 Prescription refill request for Xarelto received.  Indication: afib  Last office visit: Mealor, 09/02/2023 Weight: 86.8 kg  Age: 83 yo  Scr: 1.13, 06/21/2023 CrCl: 64 ml/min   Refill sent.

## 2023-12-30 ENCOUNTER — Ambulatory Visit (INDEPENDENT_AMBULATORY_CARE_PROVIDER_SITE_OTHER): Payer: Medicare PPO

## 2023-12-30 DIAGNOSIS — R053 Chronic cough: Secondary | ICD-10-CM | POA: Diagnosis not present

## 2023-12-30 DIAGNOSIS — I495 Sick sinus syndrome: Secondary | ICD-10-CM

## 2023-12-30 DIAGNOSIS — K219 Gastro-esophageal reflux disease without esophagitis: Secondary | ICD-10-CM | POA: Diagnosis not present

## 2023-12-30 DIAGNOSIS — J8410 Pulmonary fibrosis, unspecified: Secondary | ICD-10-CM | POA: Diagnosis not present

## 2023-12-30 DIAGNOSIS — J300 Vasomotor rhinitis: Secondary | ICD-10-CM | POA: Diagnosis not present

## 2023-12-31 LAB — CUP PACEART REMOTE DEVICE CHECK
Battery Remaining Longevity: 66 mo
Battery Remaining Percentage: 59 %
Battery Voltage: 2.99 V
Brady Statistic AP VP Percent: 17 %
Brady Statistic AP VS Percent: 60 %
Brady Statistic AS VP Percent: 1 %
Brady Statistic AS VS Percent: 23 %
Brady Statistic RA Percent Paced: 77 %
Brady Statistic RV Percent Paced: 17 %
Date Time Interrogation Session: 20250404020014
Implantable Lead Connection Status: 753985
Implantable Lead Connection Status: 753985
Implantable Lead Implant Date: 20120109
Implantable Lead Implant Date: 20120109
Implantable Lead Location: 753859
Implantable Lead Location: 753860
Implantable Pulse Generator Implant Date: 20210203
Lead Channel Impedance Value: 360 Ohm
Lead Channel Impedance Value: 380 Ohm
Lead Channel Pacing Threshold Amplitude: 0.625 V
Lead Channel Pacing Threshold Amplitude: 1.5 V
Lead Channel Pacing Threshold Pulse Width: 0.4 ms
Lead Channel Pacing Threshold Pulse Width: 0.5 ms
Lead Channel Sensing Intrinsic Amplitude: 12 mV
Lead Channel Sensing Intrinsic Amplitude: 3.1 mV
Lead Channel Setting Pacing Amplitude: 1.625
Lead Channel Setting Pacing Amplitude: 1.75 V
Lead Channel Setting Pacing Pulse Width: 0.5 ms
Lead Channel Setting Sensing Sensitivity: 3 mV
Pulse Gen Model: 2272
Pulse Gen Serial Number: 9193711

## 2024-01-08 ENCOUNTER — Encounter: Payer: Self-pay | Admitting: Cardiovascular Disease

## 2024-01-11 DIAGNOSIS — Z1283 Encounter for screening for malignant neoplasm of skin: Secondary | ICD-10-CM | POA: Diagnosis not present

## 2024-01-11 DIAGNOSIS — Z85820 Personal history of malignant melanoma of skin: Secondary | ICD-10-CM | POA: Diagnosis not present

## 2024-01-11 DIAGNOSIS — C44311 Basal cell carcinoma of skin of nose: Secondary | ICD-10-CM | POA: Diagnosis not present

## 2024-01-11 DIAGNOSIS — D225 Melanocytic nevi of trunk: Secondary | ICD-10-CM | POA: Diagnosis not present

## 2024-01-11 DIAGNOSIS — D485 Neoplasm of uncertain behavior of skin: Secondary | ICD-10-CM | POA: Diagnosis not present

## 2024-01-11 DIAGNOSIS — L570 Actinic keratosis: Secondary | ICD-10-CM | POA: Diagnosis not present

## 2024-01-11 DIAGNOSIS — X32XXXD Exposure to sunlight, subsequent encounter: Secondary | ICD-10-CM | POA: Diagnosis not present

## 2024-01-11 DIAGNOSIS — D2271 Melanocytic nevi of right lower limb, including hip: Secondary | ICD-10-CM | POA: Diagnosis not present

## 2024-01-11 DIAGNOSIS — Z08 Encounter for follow-up examination after completed treatment for malignant neoplasm: Secondary | ICD-10-CM | POA: Diagnosis not present

## 2024-01-31 DIAGNOSIS — N401 Enlarged prostate with lower urinary tract symptoms: Secondary | ICD-10-CM | POA: Diagnosis not present

## 2024-02-03 NOTE — Addendum Note (Signed)
 Addended by: Lott Rouleau A on: 02/03/2024 04:05 PM   Modules accepted: Orders

## 2024-02-03 NOTE — Progress Notes (Signed)
 Remote pacemaker transmission.

## 2024-02-10 DIAGNOSIS — J300 Vasomotor rhinitis: Secondary | ICD-10-CM | POA: Diagnosis not present

## 2024-02-10 DIAGNOSIS — R053 Chronic cough: Secondary | ICD-10-CM | POA: Diagnosis not present

## 2024-02-10 DIAGNOSIS — J8410 Pulmonary fibrosis, unspecified: Secondary | ICD-10-CM | POA: Diagnosis not present

## 2024-02-10 DIAGNOSIS — J849 Interstitial pulmonary disease, unspecified: Secondary | ICD-10-CM | POA: Diagnosis not present

## 2024-02-10 DIAGNOSIS — K219 Gastro-esophageal reflux disease without esophagitis: Secondary | ICD-10-CM | POA: Diagnosis not present

## 2024-02-27 DIAGNOSIS — X32XXXD Exposure to sunlight, subsequent encounter: Secondary | ICD-10-CM | POA: Diagnosis not present

## 2024-02-27 DIAGNOSIS — L570 Actinic keratosis: Secondary | ICD-10-CM | POA: Diagnosis not present

## 2024-02-27 DIAGNOSIS — Z08 Encounter for follow-up examination after completed treatment for malignant neoplasm: Secondary | ICD-10-CM | POA: Diagnosis not present

## 2024-02-27 DIAGNOSIS — Z85828 Personal history of other malignant neoplasm of skin: Secondary | ICD-10-CM | POA: Diagnosis not present

## 2024-03-02 ENCOUNTER — Encounter: Payer: Self-pay | Admitting: Cardiovascular Disease

## 2024-03-02 ENCOUNTER — Ambulatory Visit: Payer: Self-pay | Admitting: Cardiovascular Disease

## 2024-03-02 ENCOUNTER — Ambulatory Visit: Payer: Medicare PPO | Attending: Cardiovascular Disease | Admitting: Cardiovascular Disease

## 2024-03-02 VITALS — BP 126/66 | HR 66 | Ht 67.5 in | Wt 187.6 lb

## 2024-03-02 DIAGNOSIS — I480 Paroxysmal atrial fibrillation: Secondary | ICD-10-CM

## 2024-03-02 DIAGNOSIS — Z79899 Other long term (current) drug therapy: Secondary | ICD-10-CM | POA: Diagnosis not present

## 2024-03-02 LAB — CUP PACEART INCLINIC DEVICE CHECK
Battery Remaining Longevity: 64 mo
Battery Voltage: 2.98 V
Brady Statistic RA Percent Paced: 78 %
Brady Statistic RV Percent Paced: 17 %
Date Time Interrogation Session: 20250606145502
Implantable Lead Connection Status: 753985
Implantable Lead Connection Status: 753985
Implantable Lead Implant Date: 20120109
Implantable Lead Implant Date: 20120109
Implantable Lead Location: 753859
Implantable Lead Location: 753860
Implantable Pulse Generator Implant Date: 20210203
Lead Channel Impedance Value: 337.5 Ohm
Lead Channel Impedance Value: 375 Ohm
Lead Channel Pacing Threshold Amplitude: 0.75 V
Lead Channel Pacing Threshold Amplitude: 0.75 V
Lead Channel Pacing Threshold Amplitude: 1.5 V
Lead Channel Pacing Threshold Amplitude: 1.5 V
Lead Channel Pacing Threshold Pulse Width: 0.4 ms
Lead Channel Pacing Threshold Pulse Width: 0.4 ms
Lead Channel Pacing Threshold Pulse Width: 0.5 ms
Lead Channel Pacing Threshold Pulse Width: 0.5 ms
Lead Channel Sensing Intrinsic Amplitude: 12 mV
Lead Channel Sensing Intrinsic Amplitude: 2.5 mV
Lead Channel Setting Pacing Amplitude: 1.625
Lead Channel Setting Pacing Amplitude: 1.625
Lead Channel Setting Pacing Pulse Width: 0.5 ms
Lead Channel Setting Sensing Sensitivity: 3 mV
Pulse Gen Model: 2272
Pulse Gen Serial Number: 9193711

## 2024-03-02 NOTE — Patient Instructions (Signed)
 Medication Instructions:  Your physician recommends that you continue on your current medications as directed. Please refer to the Current Medication list given to you today.  *If you need a refill on your cardiac medications before your next appointment, please call your pharmacy*  Lab Work: BMP and CBC If you have labs (blood work) drawn today and your tests are completely normal, you will receive your results only by: MyChart Message (if you have MyChart) OR A paper copy in the mail If you have any lab test that is abnormal or we need to change your treatment, we will call you to review the results.  Testing/Procedures: None ordered.   Follow-Up: At [facility], you and your health needs are our priority.  As part of our continuing mission to provide you with exceptional heart care, our providers are all part of one team.  This team includes your primary Cardiologist (physician) and Advanced Practice Providers or APPs (Physician Assistants and Nurse Practitioners) who all work together to provide you with the care you need, when you need it.  Your next appointment:   6 months

## 2024-03-02 NOTE — Progress Notes (Signed)
    PCP: Joshua Gerald, MD   Primary EP:  Joshua [name] is a 83 y.o. male who presents today for routine electrophysiology followup.      He has a history of atrial fibrillation, currently managed with Tikosyn .  He had an A-fib ablation by Joshua. Joshua [name] in June 2021.  His Saint Jude pacemaker was initially placed by Joshua. Joshua [name], and the generator was changed in 2021.       Today, he denies symptoms of palpitations, chest pain, lower extremity edema, dizziness, presyncope, or syncope.  The patient is otherwise without complaint today.   he has no device related complaints -- no new tenderness, drainage, redness.    Physical Exam: Vitals:   03/02/24 0904  BP: 126/66  Pulse: 66  SpO2: 94%  Weight: 187 lb 9.6 oz (85.1 kg)  Height: 5' 7.5" (1.715 m)       GEN- The patient is well appearing, alert and oriented x 3 today.   Lungs- Clear to ausculation bilaterally, normal work of breathing Chest- pacemaker pocket is well healed Heart- Regular rate and rhythm, no murmurs, rubs or gallops Extremities- no clubbing, cyanosis, or edema  Pacemaker interrogation- reviewed in detail today,  See PACEART report  EKG Interpretation Date/Time:  Friday March 02 2024 09:09:02 EDT Ventricular Rate:  66 PR Interval:  176 QRS Duration:  88 QT Interval:  364 QTC Calculation: 381 R Axis:   57  Text Interpretation: Normal sinus rhythm and competeitive atrial pacing Possible Lateral infarct , age undetermined When compared with ECG of 02-Sep-2023 09:39, sinus rhythm now present Confirmed by Joshua [name] [phone]) on 03/02/2024 9:19:20 AM    Assessment and Plan:  Symptomatic sinus bradycardia  Normal pacemaker function See Joshua [name] Art report No changes today he is not device dependant today  Paroxysmal atrial fibrillation Burden by PPM is <1% S/p ablation by Joshua. Joshua [name] in June 2021 he wishes to continue tikosyn  Will check BMP today  Secondary hypercoagulable state Continue  Xarelto  20 Check CBC today  HTN Stable No change required today   Risks, benefits and potential toxicities for medications prescribed and/or refilled reviewed with patient today.   Return in 6 months  Joshua Grange, MD 03/02/2024 9:19 AM

## 2024-03-07 ENCOUNTER — Other Ambulatory Visit (HOSPITAL_COMMUNITY)
Admission: RE | Admit: 2024-03-07 | Discharge: 2024-03-07 | Disposition: A | Discharge status: Home / Self Care | Source: Ambulatory Visit | Attending: Cardiovascular Disease | Admitting: Cardiovascular Disease

## 2024-03-07 DIAGNOSIS — Z79899 Other long term (current) drug therapy: Secondary | ICD-10-CM | POA: Insufficient documentation

## 2024-03-07 DIAGNOSIS — I480 Paroxysmal atrial fibrillation: Secondary | ICD-10-CM | POA: Diagnosis not present

## 2024-03-07 LAB — BASIC METABOLIC PANEL WITH GFR
Anion gap: 8 (ref 5–15)
BUN: 8 mg/dL (ref 8–23)
CO2: 27 mmol/L (ref 22–32)
Calcium: 8.9 mg/dL (ref 8.9–10.3)
Chloride: 98 mmol/L (ref 98–111)
Creatinine, Ser: 1.05 mg/dL (ref 0.61–1.24)
GFR, Estimated: >60 mL/min (ref >60)
Glucose, Bld: 80 mg/dL (ref 70–99)
Potassium: 4.3 mmol/L (ref 3.5–5.1)
Sodium: 133 mmol/L — ABNORMAL LOW (ref 135–145)

## 2024-03-07 LAB — CBC
HCT: 43.2 % (ref 39.0–52.0)
Hemoglobin: 14.4 g/dL (ref 13.0–17.0)
MCH: 29.9 pg (ref 26.0–34.0)
MCHC: 33.3 g/dL (ref 30.0–36.0)
MCV: 89.6 fL (ref 80.0–100.0)
Platelets: 229 10*3/uL (ref 150–400)
RBC: 4.82 MIL/uL (ref 4.22–5.81)
RDW: 15.5 % (ref 11.5–15.5)
WBC: 10.5 10*3/uL (ref 4.0–10.5)
nRBC: 0 % (ref 0.0–0.2)

## 2024-04-01 ENCOUNTER — Ambulatory Visit

## 2024-04-01 DIAGNOSIS — I495 Sick sinus syndrome: Secondary | ICD-10-CM

## 2024-04-02 LAB — CUP PACEART REMOTE DEVICE CHECK
Battery Remaining Longevity: 61 mo
Battery Remaining Percentage: 56 %
Battery Voltage: 2.98 V
Brady Statistic AP VP Percent: 29 %
Brady Statistic AP VS Percent: 55 %
Brady Statistic AS VP Percent: 1 %
Brady Statistic AS VS Percent: 16 %
Brady Statistic RA Percent Paced: 84 %
Brady Statistic RV Percent Paced: 29 %
Date Time Interrogation Session: 20250706040031
Implantable Lead Connection Status: 753985
Implantable Lead Connection Status: 753985
Implantable Lead Implant Date: 20120109
Implantable Lead Implant Date: 20120109
Implantable Lead Location: 753859
Implantable Lead Location: 753860
Implantable Pulse Generator Implant Date: 20210203
Lead Channel Impedance Value: 340 Ohm
Lead Channel Impedance Value: 360 Ohm
Lead Channel Pacing Threshold Amplitude: 0.625 V
Lead Channel Pacing Threshold Amplitude: 1.125 V
Lead Channel Pacing Threshold Pulse Width: 0.4 ms
Lead Channel Pacing Threshold Pulse Width: 0.5 ms
Lead Channel Sensing Intrinsic Amplitude: 12 mV
Lead Channel Sensing Intrinsic Amplitude: 2.4 mV
Lead Channel Setting Pacing Amplitude: 1.375
Lead Channel Setting Pacing Amplitude: 1.625
Lead Channel Setting Pacing Pulse Width: 0.5 ms
Lead Channel Setting Sensing Sensitivity: 3 mV
Pulse Gen Model: 2272
Pulse Gen Serial Number: 9193711

## 2024-04-03 ENCOUNTER — Ambulatory Visit: Payer: Self-pay | Admitting: Cardiovascular Disease

## 2024-04-09 DIAGNOSIS — J984 Other disorders of lung: Secondary | ICD-10-CM | POA: Diagnosis not present

## 2024-04-09 DIAGNOSIS — Z720 Tobacco use: Secondary | ICD-10-CM | POA: Diagnosis not present

## 2024-04-10 DIAGNOSIS — J984 Other disorders of lung: Secondary | ICD-10-CM | POA: Diagnosis not present

## 2024-04-10 DIAGNOSIS — Z720 Tobacco use: Secondary | ICD-10-CM | POA: Diagnosis not present

## 2024-04-17 DIAGNOSIS — N401 Enlarged prostate with lower urinary tract symptoms: Secondary | ICD-10-CM | POA: Diagnosis not present

## 2024-04-23 DIAGNOSIS — B9689 Other specified bacterial agents as the cause of diseases classified elsewhere: Secondary | ICD-10-CM | POA: Diagnosis not present

## 2024-04-23 DIAGNOSIS — D225 Melanocytic nevi of trunk: Secondary | ICD-10-CM | POA: Diagnosis not present

## 2024-04-23 DIAGNOSIS — L0202 Furuncle of face: Secondary | ICD-10-CM | POA: Diagnosis not present

## 2024-04-23 DIAGNOSIS — Z85820 Personal history of malignant melanoma of skin: Secondary | ICD-10-CM | POA: Diagnosis not present

## 2024-04-23 DIAGNOSIS — H61002 Unspecified perichondritis of left external ear: Secondary | ICD-10-CM | POA: Diagnosis not present

## 2024-04-23 DIAGNOSIS — Z08 Encounter for follow-up examination after completed treatment for malignant neoplasm: Secondary | ICD-10-CM | POA: Diagnosis not present

## 2024-04-23 DIAGNOSIS — D485 Neoplasm of uncertain behavior of skin: Secondary | ICD-10-CM | POA: Diagnosis not present

## 2024-04-23 DIAGNOSIS — L820 Inflamed seborrheic keratosis: Secondary | ICD-10-CM | POA: Diagnosis not present

## 2024-05-20 ENCOUNTER — Other Ambulatory Visit: Payer: Self-pay | Admitting: Cardiovascular Disease

## 2024-06-04 DIAGNOSIS — H353121 Nonexudative age-related macular degeneration, left eye, early dry stage: Secondary | ICD-10-CM | POA: Diagnosis not present

## 2024-06-14 ENCOUNTER — Other Ambulatory Visit: Payer: Self-pay | Admitting: Cardiovascular Disease

## 2024-06-14 DIAGNOSIS — I480 Paroxysmal atrial fibrillation: Secondary | ICD-10-CM

## 2024-06-15 DIAGNOSIS — J8410 Pulmonary fibrosis, unspecified: Secondary | ICD-10-CM | POA: Diagnosis not present

## 2024-06-18 DIAGNOSIS — Z0000 Encounter for general adult medical examination without abnormal findings: Secondary | ICD-10-CM | POA: Diagnosis not present

## 2024-06-18 DIAGNOSIS — J849 Interstitial pulmonary disease, unspecified: Secondary | ICD-10-CM | POA: Diagnosis not present

## 2024-06-18 DIAGNOSIS — I480 Paroxysmal atrial fibrillation: Secondary | ICD-10-CM | POA: Diagnosis not present

## 2024-06-18 DIAGNOSIS — G629 Polyneuropathy, unspecified: Secondary | ICD-10-CM | POA: Diagnosis not present

## 2024-06-18 DIAGNOSIS — N182 Chronic kidney disease, stage 2 (mild): Secondary | ICD-10-CM | POA: Diagnosis not present

## 2024-06-18 DIAGNOSIS — Z6826 Body mass index (BMI) 26.0-26.9, adult: Secondary | ICD-10-CM | POA: Diagnosis not present

## 2024-06-19 ENCOUNTER — Telehealth: Payer: Self-pay | Admitting: Cardiovascular Disease

## 2024-06-19 NOTE — Telephone Encounter (Signed)
 Pt called in to inform Dr. Nancey that he had a CT done at [facility] (results in [facility]). He states it showed he has severe coronary calcification. He asked for Dr. Marko recommendations. Please advise.

## 2024-06-29 ENCOUNTER — Ambulatory Visit (INDEPENDENT_AMBULATORY_CARE_PROVIDER_SITE_OTHER): Payer: Medicare PPO

## 2024-06-29 DIAGNOSIS — I480 Paroxysmal atrial fibrillation: Secondary | ICD-10-CM | POA: Diagnosis not present

## 2024-07-02 LAB — CUP PACEART REMOTE DEVICE CHECK
Battery Remaining Longevity: 59 mo
Battery Remaining Percentage: 53 %
Battery Voltage: 2.98 V
Brady Statistic AP VP Percent: 32 %
Brady Statistic AP VS Percent: 55 %
Brady Statistic AS VP Percent: 1 %
Brady Statistic AS VS Percent: 13 %
Brady Statistic RA Percent Paced: 87 %
Brady Statistic RV Percent Paced: 32 %
Date Time Interrogation Session: 20251005040015
Implantable Lead Connection Status: 753985
Implantable Lead Connection Status: 753985
Implantable Lead Implant Date: 20120109
Implantable Lead Implant Date: 20120109
Implantable Lead Location: 753859
Implantable Lead Location: 753860
Implantable Pulse Generator Implant Date: 20210203
Lead Channel Impedance Value: 360 Ohm
Lead Channel Impedance Value: 380 Ohm
Lead Channel Pacing Threshold Amplitude: 0.625 V
Lead Channel Pacing Threshold Amplitude: 1.5 V
Lead Channel Pacing Threshold Pulse Width: 0.4 ms
Lead Channel Pacing Threshold Pulse Width: 0.5 ms
Lead Channel Sensing Intrinsic Amplitude: 12 mV
Lead Channel Sensing Intrinsic Amplitude: 2.6 mV
Lead Channel Setting Pacing Amplitude: 1.625
Lead Channel Setting Pacing Amplitude: 1.75 V
Lead Channel Setting Pacing Pulse Width: 0.5 ms
Lead Channel Setting Sensing Sensitivity: 3 mV
Pulse Gen Model: 2272
Pulse Gen Serial Number: 9193711

## 2024-07-03 NOTE — Progress Notes (Signed)
 Remote PPM Transmission

## 2024-07-05 NOTE — Progress Notes (Signed)
 Remote PPM Transmission

## 2024-07-09 ENCOUNTER — Ambulatory Visit: Payer: Self-pay | Admitting: Cardiovascular Disease

## 2024-08-31 ENCOUNTER — Encounter: Payer: Self-pay | Admitting: Cardiovascular Disease

## 2024-08-31 ENCOUNTER — Ambulatory Visit: Attending: Cardiovascular Disease | Admitting: Cardiovascular Disease

## 2024-08-31 VITALS — BP 130/70 | HR 60 | Ht 67.5 in | Wt 199.8 lb

## 2024-08-31 DIAGNOSIS — I480 Paroxysmal atrial fibrillation: Secondary | ICD-10-CM | POA: Diagnosis not present

## 2024-08-31 DIAGNOSIS — Z7901 Long term (current) use of anticoagulants: Secondary | ICD-10-CM

## 2024-08-31 DIAGNOSIS — I10 Essential (primary) hypertension: Secondary | ICD-10-CM | POA: Diagnosis not present

## 2024-08-31 LAB — CUP PACEART INCLINIC DEVICE CHECK
Battery Remaining Longevity: 57 mo
Battery Voltage: 2.98 V
Brady Statistic RA Percent Paced: 84 %
Brady Statistic RV Percent Paced: 32 %
Date Time Interrogation Session: 20251205093419
Implantable Lead Connection Status: 753985
Implantable Lead Connection Status: 753985
Implantable Lead Implant Date: 20120109
Implantable Lead Implant Date: 20120109
Implantable Lead Location: 753859
Implantable Lead Location: 753860
Implantable Pulse Generator Implant Date: 20210203
Lead Channel Impedance Value: 337.5 Ohm
Lead Channel Impedance Value: 375 Ohm
Lead Channel Pacing Threshold Amplitude: 0.5 V
Lead Channel Pacing Threshold Amplitude: 1.25 V
Lead Channel Pacing Threshold Pulse Width: 0.4 ms
Lead Channel Pacing Threshold Pulse Width: 0.5 ms
Lead Channel Sensing Intrinsic Amplitude: 12 mV
Lead Channel Sensing Intrinsic Amplitude: 2.5 mV
Lead Channel Setting Pacing Amplitude: 1.5 V
Lead Channel Setting Pacing Amplitude: 1.5 V
Lead Channel Setting Pacing Pulse Width: 0.5 ms
Lead Channel Setting Sensing Sensitivity: 3 mV
Pulse Gen Model: 2272
Pulse Gen Serial Number: 9193711

## 2024-08-31 NOTE — Progress Notes (Signed)
    PCP: Joshua Joshua LABOR, MD   Primary EP:  Dr Joshua Joshua Joshua Joshua is a 83 y.o. male who presents today for routine electrophysiology followup.      He has a history of atrial fibrillation, currently managed with Tikosyn .  He had an A-fib ablation by Dr. Kelsie in June 2021.  His Saint Jude pacemaker was initially placed by Dr. Waddell, and the generator was changed in 2021.       Today, he denies symptoms of palpitations, chest pain, lower extremity edema, dizziness, presyncope, or syncope.  The patient is otherwise without complaint today.   he has no device related complaints -- no new tenderness, drainage, redness.    Physical Exam: Vitals:   08/31/24 0847  BP: 130/70  Pulse: 60  SpO2: 95%  Weight: 199 lb 12.8 oz (90.6 kg)  Height: 5' 7.5 (1.715 m)       GEN- The patient is well appearing, alert and oriented x 3 today.   Lungs- Clear to ausculation bilaterally, normal work of breathing Chest- pacemaker pocket is well healed Heart- Regular rate and rhythm, no murmurs, rubs or gallops Extremities- no clubbing, cyanosis, or edema  Pacemaker interrogation- reviewed in detail today,  See PACEART report       Assessment and Plan:  Symptomatic sinus bradycardia  Normal pacemaker function See Pace Art report No changes today he is not device dependant today  Paroxysmal atrial fibrillation Burden by PPM is <1% S/p ablation by Dr. Kelsie in June 2021 he wishes to continue tikosyn  Will check BMP today, Mg++  Secondary hypercoagulable state Continue Xarelto  20 Check CBC today  HTN Stable No change required today   Risks, benefits and potential toxicities for medications prescribed and/or refilled reviewed with patient today.   Return in 6 months  Joshua [name] Nancey, MD 08/31/2024 8:55 AM

## 2024-08-31 NOTE — Addendum Note (Signed)
 Addended by: Shavaun Osterloh G on: 08/31/2024 09:22 AM   Modules accepted: Orders

## 2024-08-31 NOTE — Patient Instructions (Signed)
 Medication Instructions:   Continue all current medications.   Labwork:  Mg, BMET - orders given  Office will contact with results via phone, letter or mychart.    Testing/Procedures:  none  Follow-Up:  6 months   Any Other Special Instructions Will Be Listed Below (If Applicable).   If you need a refill on your cardiac medications before your next appointment, please call your pharmacy.

## 2024-09-04 DIAGNOSIS — I10 Essential (primary) hypertension: Secondary | ICD-10-CM | POA: Diagnosis not present

## 2024-09-04 DIAGNOSIS — Z7901 Long term (current) use of anticoagulants: Secondary | ICD-10-CM | POA: Diagnosis not present

## 2024-09-13 ENCOUNTER — Ambulatory Visit: Payer: Self-pay | Admitting: Cardiovascular Disease

## 2024-09-28 ENCOUNTER — Ambulatory Visit (INDEPENDENT_AMBULATORY_CARE_PROVIDER_SITE_OTHER): Payer: Medicare PPO

## 2024-09-28 DIAGNOSIS — I480 Paroxysmal atrial fibrillation: Secondary | ICD-10-CM | POA: Diagnosis not present

## 2024-09-29 LAB — CUP PACEART REMOTE DEVICE CHECK
Battery Remaining Longevity: 56 mo
Battery Remaining Percentage: 51 %
Battery Voltage: 2.98 V
Brady Statistic AP VP Percent: 32 %
Brady Statistic AP VS Percent: 51 %
Brady Statistic AS VP Percent: 1 %
Brady Statistic AS VS Percent: 17 %
Brady Statistic RA Percent Paced: 83 %
Brady Statistic RV Percent Paced: 32 %
Date Time Interrogation Session: 20260102020016
Implantable Lead Connection Status: 753985
Implantable Lead Connection Status: 753985
Implantable Lead Implant Date: 20120109
Implantable Lead Implant Date: 20120109
Implantable Lead Location: 753859
Implantable Lead Location: 753860
Implantable Pulse Generator Implant Date: 20210203
Lead Channel Impedance Value: 360 Ohm
Lead Channel Impedance Value: 380 Ohm
Lead Channel Pacing Threshold Amplitude: 0.5 V
Lead Channel Pacing Threshold Amplitude: 1 V
Lead Channel Pacing Threshold Pulse Width: 0.4 ms
Lead Channel Pacing Threshold Pulse Width: 0.5 ms
Lead Channel Sensing Intrinsic Amplitude: 11.5 mV
Lead Channel Sensing Intrinsic Amplitude: 2.4 mV
Lead Channel Setting Pacing Amplitude: 1.25 V
Lead Channel Setting Pacing Amplitude: 1.5 V
Lead Channel Setting Pacing Pulse Width: 0.5 ms
Lead Channel Setting Sensing Sensitivity: 3 mV
Pulse Gen Model: 2272
Pulse Gen Serial Number: 9193711

## 2024-10-04 NOTE — Progress Notes (Signed)
 Remote PPM Transmission

## 2024-10-06 ENCOUNTER — Ambulatory Visit: Payer: Self-pay | Admitting: Cardiovascular Disease
